# Patient Record
Sex: Female | Born: 1935 | Race: Black or African American | Hispanic: No | Smoking: Current every day smoker
Health system: Southern US, Community
[De-identification: ages and names within clinical notes are randomized; demographics above are authoritative.]

## PROBLEM LIST (undated history)

## (undated) DIAGNOSIS — M1990 Unspecified osteoarthritis, unspecified site: Secondary | ICD-10-CM

## (undated) DIAGNOSIS — I10 Essential (primary) hypertension: Secondary | ICD-10-CM

## (undated) DIAGNOSIS — N183 Chronic kidney disease, stage 3 unspecified: Secondary | ICD-10-CM

## (undated) DIAGNOSIS — E785 Hyperlipidemia, unspecified: Secondary | ICD-10-CM

## (undated) DIAGNOSIS — K219 Gastro-esophageal reflux disease without esophagitis: Secondary | ICD-10-CM

## (undated) DIAGNOSIS — C801 Malignant (primary) neoplasm, unspecified: Secondary | ICD-10-CM

## (undated) DIAGNOSIS — R0602 Shortness of breath: Secondary | ICD-10-CM

## (undated) DIAGNOSIS — J189 Pneumonia, unspecified organism: Secondary | ICD-10-CM

## (undated) HISTORY — PX: ABDOMINAL HYSTERECTOMY: SHX81

## (undated) HISTORY — PX: BREAST SURGERY: SHX581

## (undated) HISTORY — PX: WRIST SURGERY: SHX841

## (undated) HISTORY — PX: OTHER SURGICAL HISTORY: SHX169

## (undated) HISTORY — PX: CHOLECYSTECTOMY: SHX55

---

## 1997-06-13 ENCOUNTER — Encounter: Admission: RE | Admit: 1997-06-13 | Discharge: 1997-06-13 | Payer: Self-pay | Admitting: Hematology and Oncology

## 1997-12-06 ENCOUNTER — Encounter: Admission: RE | Admit: 1997-12-06 | Discharge: 1997-12-06 | Payer: Self-pay | Admitting: Internal Medicine

## 1998-04-18 ENCOUNTER — Encounter: Admission: RE | Admit: 1998-04-18 | Discharge: 1998-04-18 | Payer: Self-pay | Admitting: Internal Medicine

## 1998-08-01 ENCOUNTER — Ambulatory Visit (HOSPITAL_COMMUNITY): Admission: RE | Admit: 1998-08-01 | Discharge: 1998-08-01 | Payer: Self-pay | Admitting: Hematology and Oncology

## 1998-08-01 ENCOUNTER — Encounter: Admission: RE | Admit: 1998-08-01 | Discharge: 1998-08-01 | Payer: Self-pay | Admitting: Hematology and Oncology

## 1998-08-01 ENCOUNTER — Encounter: Payer: Self-pay | Admitting: Hematology and Oncology

## 1998-08-14 ENCOUNTER — Ambulatory Visit (HOSPITAL_COMMUNITY): Admission: RE | Admit: 1998-08-14 | Discharge: 1998-08-14 | Payer: Self-pay | Admitting: *Deleted

## 1998-09-29 ENCOUNTER — Encounter: Payer: Self-pay | Admitting: Emergency Medicine

## 1998-09-29 ENCOUNTER — Emergency Department (HOSPITAL_COMMUNITY): Admission: EM | Admit: 1998-09-29 | Discharge: 1998-09-29 | Payer: Self-pay | Admitting: Emergency Medicine

## 1998-11-15 ENCOUNTER — Emergency Department (HOSPITAL_COMMUNITY): Admission: EM | Admit: 1998-11-15 | Discharge: 1998-11-15 | Payer: Self-pay | Admitting: Emergency Medicine

## 1998-11-19 ENCOUNTER — Encounter: Admission: RE | Admit: 1998-11-19 | Discharge: 1998-11-19 | Payer: Self-pay | Admitting: Internal Medicine

## 1998-12-26 ENCOUNTER — Ambulatory Visit (HOSPITAL_BASED_OUTPATIENT_CLINIC_OR_DEPARTMENT_OTHER): Admission: RE | Admit: 1998-12-26 | Discharge: 1998-12-26 | Payer: Self-pay | Admitting: Orthopedic Surgery

## 1999-01-18 ENCOUNTER — Encounter: Admission: RE | Admit: 1999-01-18 | Discharge: 1999-02-22 | Payer: Self-pay | Admitting: Orthopedic Surgery

## 1999-05-07 ENCOUNTER — Encounter: Admission: RE | Admit: 1999-05-07 | Discharge: 1999-08-05 | Payer: Self-pay | Admitting: Orthopedic Surgery

## 1999-06-07 ENCOUNTER — Ambulatory Visit (HOSPITAL_COMMUNITY): Admission: RE | Admit: 1999-06-07 | Discharge: 1999-06-07 | Payer: Self-pay | Admitting: Internal Medicine

## 1999-06-07 ENCOUNTER — Encounter: Admission: RE | Admit: 1999-06-07 | Discharge: 1999-06-07 | Payer: Self-pay | Admitting: Internal Medicine

## 1999-06-07 ENCOUNTER — Encounter: Payer: Self-pay | Admitting: Internal Medicine

## 1999-07-22 ENCOUNTER — Encounter: Admission: RE | Admit: 1999-07-22 | Discharge: 1999-07-22 | Payer: Self-pay | Admitting: Internal Medicine

## 1999-08-06 ENCOUNTER — Encounter: Admission: RE | Admit: 1999-08-06 | Discharge: 1999-08-06 | Payer: Self-pay | Admitting: Internal Medicine

## 1999-08-06 ENCOUNTER — Ambulatory Visit (HOSPITAL_COMMUNITY): Admission: RE | Admit: 1999-08-06 | Discharge: 1999-08-06 | Payer: Self-pay | Admitting: Internal Medicine

## 1999-08-07 ENCOUNTER — Encounter: Payer: Self-pay | Admitting: Internal Medicine

## 1999-10-01 ENCOUNTER — Encounter: Admission: RE | Admit: 1999-10-01 | Discharge: 1999-10-01 | Payer: Self-pay | Admitting: Hematology and Oncology

## 1999-10-14 ENCOUNTER — Encounter: Admission: RE | Admit: 1999-10-14 | Discharge: 1999-10-14 | Payer: Self-pay | Admitting: Internal Medicine

## 1999-12-26 ENCOUNTER — Encounter: Admission: RE | Admit: 1999-12-26 | Discharge: 1999-12-26 | Payer: Self-pay

## 2000-06-19 ENCOUNTER — Ambulatory Visit (HOSPITAL_COMMUNITY): Admission: RE | Admit: 2000-06-19 | Discharge: 2000-06-19 | Payer: Self-pay

## 2000-06-19 ENCOUNTER — Encounter: Admission: RE | Admit: 2000-06-19 | Discharge: 2000-06-19 | Payer: Self-pay

## 2000-07-21 ENCOUNTER — Emergency Department (HOSPITAL_COMMUNITY): Admission: EM | Admit: 2000-07-21 | Discharge: 2000-07-21 | Payer: Self-pay | Admitting: Emergency Medicine

## 2000-10-21 ENCOUNTER — Encounter: Admission: RE | Admit: 2000-10-21 | Discharge: 2000-10-21 | Payer: Self-pay | Admitting: Internal Medicine

## 2000-12-28 ENCOUNTER — Encounter: Admission: RE | Admit: 2000-12-28 | Discharge: 2000-12-28 | Payer: Self-pay

## 2001-01-12 ENCOUNTER — Encounter: Admission: RE | Admit: 2001-01-12 | Discharge: 2001-01-12 | Payer: Self-pay | Admitting: Internal Medicine

## 2001-01-25 ENCOUNTER — Ambulatory Visit (HOSPITAL_COMMUNITY): Admission: RE | Admit: 2001-01-25 | Discharge: 2001-01-25 | Payer: Self-pay | Admitting: Internal Medicine

## 2001-02-03 ENCOUNTER — Encounter: Admission: RE | Admit: 2001-02-03 | Discharge: 2001-02-03 | Payer: Self-pay | Admitting: Internal Medicine

## 2001-03-02 ENCOUNTER — Encounter: Admission: RE | Admit: 2001-03-02 | Discharge: 2001-03-02 | Payer: Self-pay | Admitting: *Deleted

## 2001-03-05 ENCOUNTER — Encounter: Admission: RE | Admit: 2001-03-05 | Discharge: 2001-03-05 | Payer: Self-pay | Admitting: Internal Medicine

## 2001-06-02 ENCOUNTER — Encounter: Admission: RE | Admit: 2001-06-02 | Discharge: 2001-06-02 | Payer: Self-pay | Admitting: Internal Medicine

## 2001-06-09 ENCOUNTER — Encounter: Admission: RE | Admit: 2001-06-09 | Discharge: 2001-06-09 | Payer: Self-pay

## 2001-06-17 ENCOUNTER — Encounter: Admission: RE | Admit: 2001-06-17 | Discharge: 2001-06-17 | Payer: Self-pay | Admitting: Internal Medicine

## 2001-07-05 ENCOUNTER — Encounter: Admission: RE | Admit: 2001-07-05 | Discharge: 2001-07-05 | Payer: Self-pay | Admitting: Internal Medicine

## 2001-07-07 ENCOUNTER — Ambulatory Visit (HOSPITAL_BASED_OUTPATIENT_CLINIC_OR_DEPARTMENT_OTHER): Admission: RE | Admit: 2001-07-07 | Discharge: 2001-07-08 | Payer: Self-pay | Admitting: Orthopedic Surgery

## 2001-09-10 ENCOUNTER — Encounter: Admission: RE | Admit: 2001-09-10 | Discharge: 2001-12-09 | Payer: Self-pay | Admitting: Orthopedic Surgery

## 2002-01-06 ENCOUNTER — Encounter: Admission: RE | Admit: 2002-01-06 | Discharge: 2002-01-06 | Payer: Self-pay | Admitting: Orthopedic Surgery

## 2002-01-10 ENCOUNTER — Encounter: Admission: RE | Admit: 2002-01-10 | Discharge: 2002-01-10 | Payer: Self-pay | Admitting: Internal Medicine

## 2002-04-01 ENCOUNTER — Emergency Department (HOSPITAL_COMMUNITY): Admission: EM | Admit: 2002-04-01 | Discharge: 2002-04-01 | Payer: Self-pay | Admitting: Emergency Medicine

## 2002-04-01 ENCOUNTER — Encounter: Admission: RE | Admit: 2002-04-01 | Discharge: 2002-04-01 | Payer: Self-pay | Admitting: Internal Medicine

## 2002-04-04 ENCOUNTER — Encounter: Admission: RE | Admit: 2002-04-04 | Discharge: 2002-04-04 | Payer: Self-pay | Admitting: Internal Medicine

## 2002-04-07 ENCOUNTER — Encounter: Admission: RE | Admit: 2002-04-07 | Discharge: 2002-04-07 | Payer: Self-pay | Admitting: Internal Medicine

## 2002-04-12 ENCOUNTER — Encounter: Admission: RE | Admit: 2002-04-12 | Discharge: 2002-07-11 | Payer: Self-pay | Admitting: Internal Medicine

## 2002-04-21 ENCOUNTER — Encounter: Admission: RE | Admit: 2002-04-21 | Discharge: 2002-04-21 | Payer: Self-pay | Admitting: Internal Medicine

## 2002-07-06 ENCOUNTER — Encounter: Admission: RE | Admit: 2002-07-06 | Discharge: 2002-07-06 | Payer: Self-pay | Admitting: Internal Medicine

## 2002-07-14 ENCOUNTER — Encounter: Admission: RE | Admit: 2002-07-14 | Discharge: 2002-07-14 | Payer: Self-pay | Admitting: Internal Medicine

## 2002-08-20 ENCOUNTER — Emergency Department (HOSPITAL_COMMUNITY): Admission: EM | Admit: 2002-08-20 | Discharge: 2002-08-20 | Payer: Self-pay | Admitting: *Deleted

## 2002-08-20 ENCOUNTER — Encounter: Payer: Self-pay | Admitting: *Deleted

## 2002-09-01 ENCOUNTER — Encounter: Admission: RE | Admit: 2002-09-01 | Discharge: 2002-09-01 | Payer: Self-pay | Admitting: Internal Medicine

## 2002-09-02 ENCOUNTER — Encounter: Payer: Self-pay | Admitting: Internal Medicine

## 2002-09-02 ENCOUNTER — Encounter: Admission: RE | Admit: 2002-09-02 | Discharge: 2002-09-02 | Payer: Self-pay | Admitting: Internal Medicine

## 2002-09-02 ENCOUNTER — Ambulatory Visit (HOSPITAL_COMMUNITY): Admission: RE | Admit: 2002-09-02 | Discharge: 2002-09-02 | Payer: Self-pay | Admitting: Internal Medicine

## 2002-09-30 ENCOUNTER — Encounter: Admission: RE | Admit: 2002-09-30 | Discharge: 2002-09-30 | Payer: Self-pay | Admitting: Internal Medicine

## 2002-10-07 ENCOUNTER — Other Ambulatory Visit: Admission: RE | Admit: 2002-10-07 | Discharge: 2002-10-07 | Payer: Self-pay | Admitting: Internal Medicine

## 2002-10-07 ENCOUNTER — Ambulatory Visit (HOSPITAL_COMMUNITY): Admission: RE | Admit: 2002-10-07 | Discharge: 2002-10-07 | Payer: Self-pay | Admitting: Internal Medicine

## 2002-10-07 ENCOUNTER — Encounter: Admission: RE | Admit: 2002-10-07 | Discharge: 2002-10-07 | Payer: Self-pay | Admitting: Internal Medicine

## 2002-10-07 ENCOUNTER — Encounter: Payer: Self-pay | Admitting: Internal Medicine

## 2002-10-07 ENCOUNTER — Encounter (INDEPENDENT_AMBULATORY_CARE_PROVIDER_SITE_OTHER): Payer: Self-pay | Admitting: Specialist

## 2002-10-10 ENCOUNTER — Encounter: Admission: RE | Admit: 2002-10-10 | Discharge: 2002-10-10 | Payer: Self-pay | Admitting: Infectious Diseases

## 2002-10-18 ENCOUNTER — Encounter: Admission: RE | Admit: 2002-10-18 | Discharge: 2002-10-18 | Payer: Self-pay | Admitting: Internal Medicine

## 2002-10-20 ENCOUNTER — Encounter: Admission: RE | Admit: 2002-10-20 | Discharge: 2002-10-20 | Payer: Self-pay | Admitting: Internal Medicine

## 2002-12-21 ENCOUNTER — Encounter: Admission: RE | Admit: 2002-12-21 | Discharge: 2002-12-21 | Payer: Self-pay | Admitting: Internal Medicine

## 2003-01-04 ENCOUNTER — Emergency Department (HOSPITAL_COMMUNITY): Admission: EM | Admit: 2003-01-04 | Discharge: 2003-01-04 | Payer: Self-pay | Admitting: Emergency Medicine

## 2003-01-11 ENCOUNTER — Encounter: Admission: RE | Admit: 2003-01-11 | Discharge: 2003-01-11 | Payer: Self-pay | Admitting: Internal Medicine

## 2003-04-10 ENCOUNTER — Emergency Department (HOSPITAL_COMMUNITY): Admission: AD | Admit: 2003-04-10 | Discharge: 2003-04-10 | Payer: Self-pay | Admitting: Family Medicine

## 2003-05-21 ENCOUNTER — Emergency Department (HOSPITAL_COMMUNITY): Admission: AD | Admit: 2003-05-21 | Discharge: 2003-05-21 | Payer: Self-pay | Admitting: Family Medicine

## 2003-06-27 ENCOUNTER — Emergency Department (HOSPITAL_COMMUNITY): Admission: EM | Admit: 2003-06-27 | Discharge: 2003-06-27 | Payer: Self-pay | Admitting: Emergency Medicine

## 2003-07-18 ENCOUNTER — Ambulatory Visit (HOSPITAL_COMMUNITY): Admission: RE | Admit: 2003-07-18 | Discharge: 2003-07-18 | Payer: Self-pay | Admitting: Internal Medicine

## 2003-08-02 ENCOUNTER — Encounter: Admission: RE | Admit: 2003-08-02 | Discharge: 2003-08-29 | Payer: Self-pay | Admitting: Sports Medicine

## 2003-10-21 ENCOUNTER — Emergency Department (HOSPITAL_COMMUNITY): Admission: EM | Admit: 2003-10-21 | Discharge: 2003-10-21 | Payer: Self-pay | Admitting: Emergency Medicine

## 2004-04-22 ENCOUNTER — Encounter: Admission: RE | Admit: 2004-04-22 | Discharge: 2004-04-22 | Payer: Self-pay | Admitting: Sports Medicine

## 2004-07-08 ENCOUNTER — Emergency Department (HOSPITAL_COMMUNITY): Admission: EM | Admit: 2004-07-08 | Discharge: 2004-07-08 | Payer: Self-pay | Admitting: Emergency Medicine

## 2004-08-19 ENCOUNTER — Emergency Department (HOSPITAL_COMMUNITY): Admission: EM | Admit: 2004-08-19 | Discharge: 2004-08-19 | Payer: Self-pay | Admitting: Emergency Medicine

## 2005-05-24 ENCOUNTER — Emergency Department (HOSPITAL_COMMUNITY): Admission: EM | Admit: 2005-05-24 | Discharge: 2005-05-24 | Payer: Self-pay | Admitting: Emergency Medicine

## 2005-06-02 ENCOUNTER — Encounter: Admission: RE | Admit: 2005-06-02 | Discharge: 2005-06-13 | Payer: Self-pay | Admitting: Orthopedic Surgery

## 2006-04-02 ENCOUNTER — Emergency Department (HOSPITAL_COMMUNITY): Admission: EM | Admit: 2006-04-02 | Discharge: 2006-04-02 | Payer: Self-pay | Admitting: Emergency Medicine

## 2006-04-09 ENCOUNTER — Emergency Department (HOSPITAL_COMMUNITY): Admission: EM | Admit: 2006-04-09 | Discharge: 2006-04-09 | Payer: Self-pay | Admitting: Family Medicine

## 2006-09-11 ENCOUNTER — Emergency Department (HOSPITAL_COMMUNITY): Admission: EM | Admit: 2006-09-11 | Discharge: 2006-09-11 | Payer: Self-pay | Admitting: Emergency Medicine

## 2007-02-23 ENCOUNTER — Ambulatory Visit (HOSPITAL_COMMUNITY): Admission: RE | Admit: 2007-02-23 | Discharge: 2007-02-23 | Payer: Self-pay | Admitting: Specialist

## 2007-02-24 ENCOUNTER — Emergency Department (HOSPITAL_COMMUNITY): Admission: EM | Admit: 2007-02-24 | Discharge: 2007-02-24 | Payer: Self-pay | Admitting: Emergency Medicine

## 2007-02-27 ENCOUNTER — Encounter (INDEPENDENT_AMBULATORY_CARE_PROVIDER_SITE_OTHER): Payer: Self-pay | Admitting: Emergency Medicine

## 2007-02-27 ENCOUNTER — Emergency Department (HOSPITAL_COMMUNITY): Admission: EM | Admit: 2007-02-27 | Discharge: 2007-02-27 | Payer: Self-pay | Admitting: Emergency Medicine

## 2007-02-27 ENCOUNTER — Ambulatory Visit: Payer: Self-pay | Admitting: Vascular Surgery

## 2007-03-01 ENCOUNTER — Emergency Department (HOSPITAL_COMMUNITY): Admission: EM | Admit: 2007-03-01 | Discharge: 2007-03-01 | Payer: Self-pay | Admitting: *Deleted

## 2007-04-01 ENCOUNTER — Encounter: Admission: RE | Admit: 2007-04-01 | Discharge: 2007-04-01 | Payer: Self-pay | Admitting: General Surgery

## 2007-04-30 ENCOUNTER — Ambulatory Visit (HOSPITAL_COMMUNITY): Admission: RE | Admit: 2007-04-30 | Discharge: 2007-04-30 | Payer: Self-pay | Admitting: General Surgery

## 2007-05-05 ENCOUNTER — Encounter: Admission: RE | Admit: 2007-05-05 | Discharge: 2007-05-05 | Payer: Self-pay | Admitting: General Surgery

## 2007-06-07 ENCOUNTER — Emergency Department (HOSPITAL_COMMUNITY): Admission: EM | Admit: 2007-06-07 | Discharge: 2007-06-07 | Payer: Self-pay | Admitting: Emergency Medicine

## 2007-09-29 ENCOUNTER — Emergency Department (HOSPITAL_COMMUNITY): Admission: EM | Admit: 2007-09-29 | Discharge: 2007-09-29 | Payer: Self-pay | Admitting: Emergency Medicine

## 2007-10-31 ENCOUNTER — Emergency Department (HOSPITAL_COMMUNITY): Admission: EM | Admit: 2007-10-31 | Discharge: 2007-10-31 | Payer: Self-pay | Admitting: Emergency Medicine

## 2007-11-02 ENCOUNTER — Observation Stay (HOSPITAL_COMMUNITY): Admission: EM | Admit: 2007-11-02 | Discharge: 2007-11-04 | Payer: Self-pay | Admitting: Emergency Medicine

## 2008-05-06 ENCOUNTER — Emergency Department (HOSPITAL_COMMUNITY): Admission: EM | Admit: 2008-05-06 | Discharge: 2008-05-06 | Payer: Self-pay | Admitting: Emergency Medicine

## 2008-10-28 ENCOUNTER — Emergency Department (HOSPITAL_COMMUNITY): Admission: EM | Admit: 2008-10-28 | Discharge: 2008-10-28 | Payer: Self-pay | Admitting: Emergency Medicine

## 2009-02-01 ENCOUNTER — Emergency Department (HOSPITAL_COMMUNITY): Admission: EM | Admit: 2009-02-01 | Discharge: 2009-02-01 | Payer: Self-pay | Admitting: Family Medicine

## 2009-02-17 ENCOUNTER — Inpatient Hospital Stay (HOSPITAL_COMMUNITY): Admission: EM | Admit: 2009-02-17 | Discharge: 2009-02-21 | Payer: Self-pay | Admitting: Emergency Medicine

## 2009-11-28 ENCOUNTER — Emergency Department (HOSPITAL_COMMUNITY): Admission: EM | Admit: 2009-11-28 | Discharge: 2009-11-28 | Payer: Self-pay | Admitting: Emergency Medicine

## 2009-11-29 ENCOUNTER — Emergency Department (HOSPITAL_COMMUNITY): Admission: EM | Admit: 2009-11-29 | Discharge: 2009-11-29 | Payer: Self-pay | Admitting: Emergency Medicine

## 2010-03-24 ENCOUNTER — Encounter: Payer: Self-pay | Admitting: Sports Medicine

## 2010-03-24 ENCOUNTER — Encounter: Payer: Self-pay | Admitting: Internal Medicine

## 2010-03-25 ENCOUNTER — Encounter: Payer: Self-pay | Admitting: Internal Medicine

## 2010-05-16 LAB — GLUCOSE, CAPILLARY: Glucose-Capillary: 100 mg/dL — ABNORMAL HIGH (ref 70–99)

## 2010-06-03 LAB — CBC
HCT: 30.3 % — ABNORMAL LOW (ref 36.0–46.0)
HCT: 32.1 % — ABNORMAL LOW (ref 36.0–46.0)
Hemoglobin: 10.3 g/dL — ABNORMAL LOW (ref 12.0–15.0)
Hemoglobin: 10.7 g/dL — ABNORMAL LOW (ref 12.0–15.0)
Hemoglobin: 10.9 g/dL — ABNORMAL LOW (ref 12.0–15.0)
MCHC: 33.2 g/dL (ref 30.0–36.0)
MCHC: 33.3 g/dL (ref 30.0–36.0)
MCHC: 33.7 g/dL (ref 30.0–36.0)
MCV: 91.3 fL (ref 78.0–100.0)
MCV: 91.7 fL (ref 78.0–100.0)
Platelets: 254 10*3/uL (ref 150–400)
RBC: 3.37 MIL/uL — ABNORMAL LOW (ref 3.87–5.11)
RBC: 3.51 MIL/uL — ABNORMAL LOW (ref 3.87–5.11)
RDW: 13.9 % (ref 11.5–15.5)
RDW: 14.1 % (ref 11.5–15.5)
RDW: 14.3 % (ref 11.5–15.5)
WBC: 5.5 10*3/uL (ref 4.0–10.5)

## 2010-06-03 LAB — BASIC METABOLIC PANEL
BUN: 10 mg/dL (ref 6–23)
BUN: 12 mg/dL (ref 6–23)
CO2: 26 mEq/L (ref 19–32)
CO2: 28 mEq/L (ref 19–32)
CO2: 28 mEq/L (ref 19–32)
CO2: 28 mEq/L (ref 19–32)
Calcium: 8 mg/dL — ABNORMAL LOW (ref 8.4–10.5)
Calcium: 8.1 mg/dL — ABNORMAL LOW (ref 8.4–10.5)
Calcium: 8.2 mg/dL — ABNORMAL LOW (ref 8.4–10.5)
Calcium: 8.9 mg/dL (ref 8.4–10.5)
Chloride: 106 mEq/L (ref 96–112)
Chloride: 106 mEq/L (ref 96–112)
Creatinine, Ser: 1.01 mg/dL (ref 0.4–1.2)
Creatinine, Ser: 1.16 mg/dL (ref 0.4–1.2)
Creatinine, Ser: 1.16 mg/dL (ref 0.4–1.2)
GFR calc Af Amer: 56 mL/min — ABNORMAL LOW (ref >60)
GFR calc Af Amer: 56 mL/min — ABNORMAL LOW (ref >60)
GFR calc Af Amer: 60 mL/min — ABNORMAL LOW (ref >60)
GFR calc Af Amer: >60 mL/min (ref >60)
GFR calc non Af Amer: 46 mL/min — ABNORMAL LOW (ref >60)
GFR calc non Af Amer: 47 mL/min — ABNORMAL LOW (ref >60)
GFR calc non Af Amer: 49 mL/min — ABNORMAL LOW (ref >60)
Glucose, Bld: 102 mg/dL — ABNORMAL HIGH (ref 70–99)
Glucose, Bld: 129 mg/dL — ABNORMAL HIGH (ref 70–99)
Potassium: 3.8 mEq/L (ref 3.5–5.1)
Potassium: 4 mEq/L (ref 3.5–5.1)
Sodium: 137 mEq/L (ref 135–145)
Sodium: 141 mEq/L (ref 135–145)
Sodium: 141 mEq/L (ref 135–145)

## 2010-06-03 LAB — GLUCOSE, CAPILLARY
Glucose-Capillary: 103 mg/dL — ABNORMAL HIGH (ref 70–99)
Glucose-Capillary: 111 mg/dL — ABNORMAL HIGH (ref 70–99)
Glucose-Capillary: 125 mg/dL — ABNORMAL HIGH (ref 70–99)
Glucose-Capillary: 144 mg/dL — ABNORMAL HIGH (ref 70–99)
Glucose-Capillary: 166 mg/dL — ABNORMAL HIGH (ref 70–99)
Glucose-Capillary: 98 mg/dL (ref 70–99)
Glucose-Capillary: 98 mg/dL (ref 70–99)

## 2010-06-03 LAB — HEMOGLOBIN A1C: Mean Plasma Glucose: 154 mg/dL

## 2010-06-03 LAB — URINALYSIS, ROUTINE W REFLEX MICROSCOPIC
Bilirubin Urine: NEGATIVE
Ketones, ur: NEGATIVE mg/dL
Nitrite: NEGATIVE
Urobilinogen, UA: 0.2 mg/dL (ref 0.0–1.0)
pH: 7 (ref 5.0–8.0)

## 2010-06-03 LAB — DIFFERENTIAL
Basophils Absolute: 0 10*3/uL (ref 0.0–0.1)
Basophils Relative: 0 % (ref 0–1)
Eosinophils Relative: 0 % (ref 0–5)
Monocytes Absolute: 0.7 10*3/uL (ref 0.1–1.0)

## 2010-06-03 LAB — CULTURE, BLOOD (ROUTINE X 2)
Culture: NO GROWTH
Culture: NO GROWTH

## 2010-06-03 LAB — LIPID PANEL
Cholesterol: 107 mg/dL (ref 0–200)
HDL: 49 mg/dL (ref >39)

## 2010-06-04 LAB — DIFFERENTIAL
Basophils Relative: 0 % (ref 0–1)
Eosinophils Absolute: 0.2 10*3/uL (ref 0.0–0.7)
Lymphs Abs: 1.3 10*3/uL (ref 0.7–4.0)
Monocytes Absolute: 0.5 10*3/uL (ref 0.1–1.0)
Monocytes Relative: 5 % (ref 3–12)
Neutrophils Relative %: 78 % — ABNORMAL HIGH (ref 43–77)

## 2010-06-04 LAB — POCT I-STAT, CHEM 8
Calcium, Ion: 1.11 mmol/L — ABNORMAL LOW (ref 1.12–1.32)
Chloride: 106 mEq/L (ref 96–112)
Creatinine, Ser: 0.9 mg/dL (ref 0.4–1.2)
Glucose, Bld: 111 mg/dL — ABNORMAL HIGH (ref 70–99)
Hemoglobin: 12.2 g/dL (ref 12.0–15.0)
Potassium: 3.5 mEq/L (ref 3.5–5.1)

## 2010-06-04 LAB — CBC
HCT: 33.9 % — ABNORMAL LOW (ref 36.0–46.0)
Hemoglobin: 11.7 g/dL — ABNORMAL LOW (ref 12.0–15.0)
MCHC: 34.5 g/dL (ref 30.0–36.0)
MCV: 91.1 fL (ref 78.0–100.0)
RBC: 3.72 MIL/uL — ABNORMAL LOW (ref 3.87–5.11)
WBC: 9.1 10*3/uL (ref 4.0–10.5)

## 2010-06-08 LAB — URINALYSIS, ROUTINE W REFLEX MICROSCOPIC
Hgb urine dipstick: NEGATIVE
Nitrite: NEGATIVE
Protein, ur: NEGATIVE mg/dL
Urobilinogen, UA: 0.2 mg/dL (ref 0.0–1.0)

## 2010-06-08 LAB — GLUCOSE, CAPILLARY

## 2010-06-08 LAB — COMPREHENSIVE METABOLIC PANEL
ALT: 15 U/L (ref 0–35)
Alkaline Phosphatase: 86 U/L (ref 39–117)
BUN: 18 mg/dL (ref 6–23)
CO2: 22 mEq/L (ref 19–32)
GFR calc non Af Amer: 42 mL/min — ABNORMAL LOW (ref >60)
Glucose, Bld: 121 mg/dL — ABNORMAL HIGH (ref 70–99)
Potassium: 3.6 mEq/L (ref 3.5–5.1)
Sodium: 138 mEq/L (ref 135–145)
Total Bilirubin: 0.7 mg/dL (ref 0.3–1.2)

## 2010-06-08 LAB — CBC
HCT: 38.3 % (ref 36.0–46.0)
Hemoglobin: 12.8 g/dL (ref 12.0–15.0)
RBC: 4.16 MIL/uL (ref 3.87–5.11)

## 2010-06-08 LAB — DIFFERENTIAL
Basophils Absolute: 0 10*3/uL (ref 0.0–0.1)
Basophils Relative: 0 % (ref 0–1)
Eosinophils Absolute: 0.1 10*3/uL (ref 0.0–0.7)
Neutrophils Relative %: 74 % (ref 43–77)

## 2010-06-13 LAB — POCT URINALYSIS DIP (DEVICE)
Glucose, UA: NEGATIVE mg/dL
Hgb urine dipstick: NEGATIVE
Nitrite: NEGATIVE
Urobilinogen, UA: 0.2 mg/dL (ref 0.0–1.0)

## 2010-06-27 ENCOUNTER — Emergency Department (HOSPITAL_COMMUNITY): Payer: Medicare Other

## 2010-06-27 ENCOUNTER — Emergency Department (HOSPITAL_COMMUNITY)
Admission: EM | Admit: 2010-06-27 | Discharge: 2010-06-27 | Disposition: A | Discharge status: Home / Self Care | Payer: Medicare Other | Attending: Emergency Medicine | Admitting: Emergency Medicine

## 2010-06-27 DIAGNOSIS — R05 Cough: Secondary | ICD-10-CM | POA: Insufficient documentation

## 2010-06-27 DIAGNOSIS — J3489 Other specified disorders of nose and nasal sinuses: Secondary | ICD-10-CM | POA: Insufficient documentation

## 2010-06-27 DIAGNOSIS — E119 Type 2 diabetes mellitus without complications: Secondary | ICD-10-CM | POA: Insufficient documentation

## 2010-06-27 DIAGNOSIS — I10 Essential (primary) hypertension: Secondary | ICD-10-CM | POA: Insufficient documentation

## 2010-06-27 DIAGNOSIS — R059 Cough, unspecified: Secondary | ICD-10-CM | POA: Insufficient documentation

## 2010-06-27 DIAGNOSIS — J449 Chronic obstructive pulmonary disease, unspecified: Secondary | ICD-10-CM | POA: Insufficient documentation

## 2010-06-27 DIAGNOSIS — R062 Wheezing: Secondary | ICD-10-CM | POA: Insufficient documentation

## 2010-06-27 DIAGNOSIS — J4489 Other specified chronic obstructive pulmonary disease: Secondary | ICD-10-CM | POA: Insufficient documentation

## 2010-07-16 NOTE — H&P (Signed)
Stacey Zamora, Stacey Zamora                  ACCOUNT NO.:  [id number]   MEDICAL RECORD NO.:  [id number]          PATIENT TYPE:  OBV   LOCATION:  5127                         FACILITY:  [facility]   PHYSICIAN:  Fleet Contras, M.D.    DATE OF BIRTH:  14-Feb-1936   DATE OF ADMISSION:  11/02/2007  DATE OF DISCHARGE:                              HISTORY & PHYSICAL   PRESENTING COMPLAINTS:  Abdominal pain, nausea, vomiting, and diarrhea.   HISTORY OF PRESENTING ILLNESS:  Stacey Zamora is a 75 year old African  American lady with medical history significant for systemic  hypertension, type 2 diabetes mellitus, gastroesophageal reflux disease,  right breast cancer, and arthritis of multiple joints including shoulder  and hips.  She was referred from the office to the emergency room after  she presented with severe abdominal pain in the lower abdomen associated  with nausea and vomiting, which she states that has been going on for  about 4 days.  She had been to the ED 3 days earlier and had blood work,  which were all negative.  She has received intravenous fluids,  intravenous antiemetic agents, and was discharged home, but since she  went home, her symptoms have not abated.  She stated that she cannot  keep any food or drinks down and that she is in severe pain persistently  and therefore came to the office for followup.  In the office, she was  in severe painful distress, retching but no vomiting.  She also stated  that she had been having watery diarrhea, which had improved, but  recurred again in the last few days.  She denied having any chest pain,  shortness of breath, orthopnea, or PND.  She denies any recent cold or  cough.  No fevers or chills.  As a result of severity of her symptoms,  she was referred back to the emergency room for further evaluation and  CT scan.  At the ED, she had repeat of her blood work, which all back  negative and a CT scan of the abdomen and pelvis also revealed no acute  abnormalities, but she was admitted to the hospital for pain management  and management of vomiting.   PAST MEDICAL HISTORY:  1. Systemic hypertension.  2. Type 2 diabetes mellitus.  3. History of right breast cancer.  4. Degenerative joint disease of the shoulders and hips and knees.  5. Gastroesophageal reflux disease.  6. Allergic rhinitis.  7. Diabetic neuropathy.   MEDICATION HISTORY:  She is on:  1. Warfarin 500 mg b.i.d.  2. Colace 100 mg daily.  3. Astelin nasal spray 2 sprays each nostril b.i.d.  4. Flonase 2 sprays each nostril daily.  5. Actos 50 mg daily.  6. Amitriptyline 50 mg at bedtime.  7. Flexeril 5 mg 3 times a day as needed.  8. Albuterol HFA 2 puffs q.6 h. p.r.n.  9. Kapidex 60 mg one a day.   ALLERGIES:  MORPHINE and VICODIN, which cause pruritus and hives.   FAMILY AND SOCIAL HISTORY:  She lives with her family.  Her parents are  deceased.  She has seven siblings, one of them with diabetes.  She has  three children in apparently good health.  She denies any use of  alcohol, tobacco, or elicit.   Her review of systems essentially as above.   PHYSICAL EXAMINATION:  In the office, she was in severe painful  distress.  She is not pale.  She is not icteric.  She is not cyanosed.  Her initial vital signs shows a blood pressure of 136/84, heart rate of  96 and regular, respiratory rate of 16, and temperature 97.6.  She  weighs in at 200 pounds at a height of 5 feet 1 inch, and BMI of 37.6.  Her oral mucosa is dry.  Her neck is supple with no elevated JVD and no  carotid bruit.  Chest shows good air entry bilaterally with no rales,  rhonchi, or wheezes.  Heart sounds 1 and 2 are heard with no murmurs, no  rub, and no clicks.  Abdomen is obese.  There is tenderness in the  epigastrium, lower abdomen.  There is no rebound, no tenderness, and no  guarding.  Bowel sounds are present.  Extremities showed no edema, no  calf tenderness, or swelling.  CNS, she is  alert and oriented x3 with no  focal neurological deficits.   LABORATORY DATA:  White count is 7.2, hemoglobin 12.2, hematocrit 36.4,  and platelet count of 286.  Sodium is 140, potassium 3.7, chloride 109,  bicarbonate of 22, BUN is 14, creatinine 1.19, and glucose is 106.  Lipase is 27, AST is 28, alkaline phosphatase 71, ALT 16, total  bilirubin is 0.6.  CT scan of the abdomen and pelvis shows no acute  abnormality.  There is a nodular opacity in the right lower lung, which  needs further close monitoring.   ASSESSMENT:  Stacey Zamora is a 75 year old African American lady with  multiple medical problems as stated above presenting with severe  abdominal pain of unknown etiology.  She is being admitted to the  hospital for pain management and close monitoring.   ADMISSION DIAGNOSES:  Abdominal pain and mild dehydration.  She will be  on clear liquids and vital signs q.4 h.  Her CBGs will be monitored  before meals to  restrict I's and O's.  She will be on IV Zofran  q.4h  p.r.n. for vomiting.  Sliding scale NovoLog insulin per protocol with  home medications noted as above except for metformin as a result of  recent contrast study.  This plan of care has been discussed with her  and her daughter and they agree with plan.      Fleet Contras, M.D.  Electronically Signed     EA/MEDQ  D:  11/02/2007  T:  11/03/2007  Job:  [id number]

## 2010-07-16 NOTE — Op Note (Signed)
Stacey Zamora, Stacey Zamora                  ACCOUNT NO.:  [id number]   MEDICAL RECORD NO.:  [id number]          PATIENT TYPE:  AMB   LOCATION:  DAY                          FACILITY:  [facility]   PHYSICIAN:  Lennie Muckle, MD      DATE OF BIRTH:  18-May-1935   DATE OF PROCEDURE:  04/30/2007  DATE OF DISCHARGE:                               OPERATIVE REPORT   PREOPERATIVE DIAGNOSIS:  Symptomatic cholelithiasis.   POSTOPERATIVE DIAGNOSIS:  Right upper quadrant pain.   PROCEDURE:  Diagnostic laparoscopy.   SURGEON:  Lennie Muckle, MD   ASSISTANT:  Angelia Mould. Derrell Lolling, MD   ANESTHESIA:  General endotracheal anesthesia.   FINDINGS:  No visible gallbladder, patent common bile duct.   SPECIMENS:  None.   COMPLICATIONS:  No immediate complications.   ESTIMATED BLOOD LOSS:  Minimal amount of blood loss.   INDICATIONS FOR PROCEDURE:  Ms. Stacey Zamora is a 75 year old female who  was previously being seen by Dr. Sheppard Plumber. Earlene Plater due to what was  presumed to be symptomatic cholelithiasis.  She had a full workup which  included chest CT as well as an abdominal ultrasound.  The ultrasound  revealed a contracted gallbladder with a thickened wall.  She was  supposed to have her surgery done by Dr. Earlene Plater, however, due to his  recent illness I took over her care.  I reviewed her records and agreed  with the diagnosis.  She was obtained informed consent for laparoscopic  cholecystectomy with cholangiogram.   DESCRIPTION OF PROCEDURE:  Ms. Stacey Zamora was identified in the preoperative  holding suite.  She was given IV antibiotic and then taken to the  operating room.  Once in the operating room she was placed in a supine  position and received general endotracheal anesthesia.  A time-out  procedure indicating the patient and procedure were performed after her  abdomen was prepped and draped in the usual sterile fashion.  A  supraumbilical incision was placed.  The umbilicus was retracted up away  from the  abdominal cavity and then a Veress needle placed into the  abdominal cavity.  After obtaining an adequate pneumoperitoneum a camera  was placed using Optiview into the abdominal cavity.  There was no  evidence of injury upon placement of the camera or the Veress needle.  Three additional 5 mm trocars were placed under visualization with the  camera.  One was placed at the gastric region and two were placed along  the right costochondral margin.  The liver was lifted away from the  omentum in the area of the duodenum.  There appeared to be a very small  retracted bluish colored object which was believed to be the  gallbladder.  The peritoneum surrounding this was carefully dissected  with the [place] forceps.  I carefully dissected on the medial and  lateral aspects of this structure.  However, it was noted upon further  inspection by Dr. Derrell Lolling and myself we saw the hepatic artery coursing  in close proximity to this structure and it appeared to dive into the  duodenum  as well as up into the liver.  Thus, it was believed to be the  common bile duct and not the gallbladder. After further careful  inspection we retrieved the patient's records from the computer,  reviewed the ultrasound as well as had a discussion with the radiologist  the results of the ultrasound.  The presumed gallbladder could have been  a thickened duodenum due to its close vicinity in the area.  We could  not rule out a congenital absence of the gallbladder.  All of the  anatomy upon further inspection did clearly seem to be the common bile  duct and there did not appear to be a gallbladder present.  At that time  I aborted the procedure.  The supraumbilical fascia was closed with zero  Vicryl suture.  The skin was closed at the port sites with 4-0 Monocryl  after release of pneumoperitoneum.  Steri-Strips were placed with final  dressing.  The patient was then extubated and transported to the post  anesthesia care  unit in stable condition.  I will have her perform a  HIDA scan as an outpatient to reinforce the findings at the time of  surgery.      Lennie Muckle, MD  Electronically Signed     ALA/MEDQ  D:  04/30/2007  T:  05/01/2007  Job:  [id number]   cc:   Fleet Contras, M.D.  Fax: [phone]

## 2010-07-19 NOTE — Op Note (Signed)
[place]  Patient:    MEHR, DEPAOLI Visit Number: [id number] MRN: [id number]          Service Type: DSU Location: [facility] Attending Physician:  Marlowe Shores Dictated by:   Artist Pais Mina Marble, M.D. Proc. Date: 08/07/01 Admit Date:  07/07/2001 Discharge Date: 07/08/2001                             Operative Report  PREOPERATIVE DIAGNOSIS:  Right thumb carpal metacarpal joint arthritis.  POSTOPERATIVE DIAGNOSIS:  Right thumb carpal metacarpal joint arthritis.  PROCEDURES:  Right thumb carpal metacarpal joint suspension plasty.  SURGEON:  Artist Pais. Mina Marble, M.D.  ASSISTANT:  Junius Roads. Ireton, P.A.C.  ANESTHESIA:  Axillary block.  TOURNIQUET TIME:  One hour.  COMPLICATIONS:  None.  DRAINS:  None.  DESCRIPTION OF PROCEDURE:  The patient was taken to the operating room.  After the induction of adequate axillary block analgesia, the right upper extremity was prepped and draped in the usual sterile fashion.  An Esmarch was used to exsanguinate the limb and then the tourniquet was inflated to 250 mmHg.  At this point in time, a J-shaped incision was made over right thumb and a large volar based flap was elevated.  The thenar muscles were elevated off of the CMP joint.  The CM joint was identified and incised and CMC synovectomy and trapeziectomy were performed.  Once this was done, a gouge and sequential drill bits were used to make transosseous canals in both the thumb and metacarpal base and the index metacarpal base under direct vision.  After this was done, a second incision was made over the dorsal radial aspect of the right wrist where the APL tendon was identified, retracted at the musculotendinous junction, incised, and drawn into the proximal wound.  It was then drawn from dorsal to volar through the thumb metacarpal and then volar to dorsal through the index metacarpal and tied to a third incision to the extensor carpi  radialis brevis tendon using Pulvertaft weaved.  After this was done, the wound was thoroughly irrigated.  The capsule overlying the [facility] joint was closed with 4-0 Mersilene and then the skin incisions x 3 were closed with 3-0 Prolene subcuticularly.  Steri-Strips, 4 x 4s, fluffs, and a compressive dressing, as well as a splint were applied.  The patient tolerated the procedure well and went to the recovery room in stable fashion. Dictated by:   Artist Pais Mina Marble, M.D. Attending Physician:  Marlowe Shores DD:  08/18/01 TD:  08/19/01 Job: [id number]

## 2010-07-19 NOTE — Discharge Summary (Signed)
NAMEALANIS, Stacey Zamora                  ACCOUNT NO.:  [id number]   MEDICAL RECORD NO.:  [id number]          PATIENT TYPE:  OBV   LOCATION:  5124                         FACILITY:  [facility]   PHYSICIAN:  Fleet Contras, M.D.    DATE OF BIRTH:  February 11, 1936   DATE OF ADMISSION:  11/02/2007  DATE OF DISCHARGE:  11/04/2007                               DISCHARGE SUMMARY   Please see the dictated history and physical for full details of  presentation.   SUMMARY:  Stacey Zamora is a 75 year old African American lady with multiple  medical problems including systemic hypertension, type 2 diabetes  mellitus, gastroesophageal reflux disease, right breast cancer, and  arthritis of multiple joint including the shoulder and hips.  She was  admitted from the office with severe abdominal pain in the lower abdomen  associated with nausea and vomiting, which had been going on for about 4  days.  She had been seen in the ED 3 days earlier and blood work had  been negative.  A CT scan of the abdomen and pelvis also revealed no  abnormalities.  She was admitted to the hospital for intractable pain  and vomiting.   HOSPITAL COURSE:  On admission, the patient was started on intravenous  fluids.  IV Zofran was given for nausea and vomiting.  She was initially  on clear liquids and this was rapidly advanced.  Her CBGs were monitored  a.c. and h.s. and covered with sliding scale NovoLog insulin.  Her  symptoms improved by the next day November 04, 2007.  She was feeling  much better and able to tolerate full diet.  Her vital signs were  stable.  CBG was 138.  Abdomen was benign.  C. difficile toxin was  negative and she was therefore considered stable for discharge home.   DISCHARGE DIAGNOSES:  1. Abdominal pain due to gastroenteritis.  2. Gastroenteritis.  3. Dehydration.  4. Type 2 diabetes.  5. Systemic hypertension.   Condition on discharge was stable.   Her disposition was for home.   Her discharge  medications were:  1. Metformin 500 mg b.i.d.  2. Actos q.a.m.  3. Cozaar 50 mg daily.  4. Gabapentin 300 mg 3 pills 3 times a day.  5. Amitriptyline 50 mg q.h.s.  6. Diazepam 10 mg daily p.r.n.  7. Tramadol 50 mg 1 q. 6 p.r.n.  8. Hydroxyzine 25 mg once a day p.r.n.  9. Flexeril 10 mg t.i.d. a.c.  10.Darvocet-N 100 one p.o. q. 6 p.r.n.   The plan of care was discussed with her and her family and their  questions were answered.      Fleet Contras, M.D.  Electronically Signed     EA/MEDQ  D:  12/15/2007  T:  12/15/2007  Job:  [id number]

## 2010-09-25 ENCOUNTER — Other Ambulatory Visit: Payer: Self-pay | Admitting: Internal Medicine

## 2010-09-25 DIAGNOSIS — K219 Gastro-esophageal reflux disease without esophagitis: Secondary | ICD-10-CM

## 2010-09-27 ENCOUNTER — Other Ambulatory Visit: Payer: Medicare Other

## 2010-11-22 LAB — COMPREHENSIVE METABOLIC PANEL
AST: 33
CO2: 26
Calcium: 9.3
Creatinine, Ser: 1.26 — ABNORMAL HIGH
GFR calc Af Amer: 51 — ABNORMAL LOW
GFR calc non Af Amer: 42 — ABNORMAL LOW

## 2010-11-22 LAB — URINALYSIS, ROUTINE W REFLEX MICROSCOPIC
Protein, ur: NEGATIVE
Urobilinogen, UA: 0.2

## 2010-11-22 LAB — HEMOGLOBIN AND HEMATOCRIT, BLOOD: Hemoglobin: 11.8 — ABNORMAL LOW

## 2010-11-26 LAB — POCT RAPID STREP A: Streptococcus, Group A Screen (Direct): NEGATIVE

## 2010-12-04 LAB — COMPREHENSIVE METABOLIC PANEL
Alkaline Phosphatase: 71
BUN: 14
Calcium: 9.5
Creatinine, Ser: 1.19
Glucose, Bld: 106 — ABNORMAL HIGH
Total Protein: 7.4

## 2010-12-04 LAB — DIFFERENTIAL
Basophils Absolute: 0
Basophils Relative: 1
Eosinophils Absolute: 0.1
Eosinophils Relative: 1
Lymphocytes Relative: 17
Lymphs Abs: 1.2
Monocytes Absolute: 0.3
Monocytes Relative: 5
Neutro Abs: 5.5
Neutrophils Relative %: 77

## 2010-12-04 LAB — COMPREHENSIVE METABOLIC PANEL WITH GFR
ALT: 16
AST: 28
Albumin: 4
CO2: 22
Chloride: 109
GFR calc Af Amer: 54 — ABNORMAL LOW
GFR calc non Af Amer: 45 — ABNORMAL LOW
Potassium: 3.7
Sodium: 140
Total Bilirubin: 0.6

## 2010-12-04 LAB — CBC
HCT: 34.3 — ABNORMAL LOW
HCT: 36.4
Hemoglobin: 12.2
MCHC: 33.5
MCV: 90.8
MCV: 92
Platelets: 266
Platelets: 286
RBC: 3.96
RDW: 13.7
RDW: 14.2
WBC: 7.2

## 2010-12-04 LAB — GLUCOSE, CAPILLARY
Glucose-Capillary: 103 — ABNORMAL HIGH
Glucose-Capillary: 119 — ABNORMAL HIGH
Glucose-Capillary: 138 — ABNORMAL HIGH
Glucose-Capillary: 69 — ABNORMAL LOW
Glucose-Capillary: 76
Glucose-Capillary: 84
Glucose-Capillary: 95
Glucose-Capillary: 95
Glucose-Capillary: 96

## 2010-12-04 LAB — URINALYSIS, ROUTINE W REFLEX MICROSCOPIC
Bilirubin Urine: NEGATIVE
Glucose, UA: NEGATIVE
Hgb urine dipstick: NEGATIVE
Ketones, ur: 15 — AB
Nitrite: NEGATIVE
Protein, ur: NEGATIVE
Specific Gravity, Urine: 1.017
Urobilinogen, UA: 0.2
pH: 5

## 2010-12-04 LAB — BASIC METABOLIC PANEL
BUN: 12
Chloride: 107
GFR calc non Af Amer: 37 — ABNORMAL LOW
Glucose, Bld: 81
Potassium: 3.5

## 2010-12-04 LAB — URINE MICROSCOPIC-ADD ON

## 2010-12-06 LAB — DIFFERENTIAL
Basophils Absolute: 0.1
Basophils Relative: 1
Basophils Relative: 1
Eosinophils Absolute: 0.1
Lymphocytes Relative: 26
Lymphs Abs: 2.2
Monocytes Absolute: 0.4
Monocytes Relative: 5
Neutro Abs: 4.6
Neutro Abs: 4.7
Neutrophils Relative %: 67

## 2010-12-06 LAB — COMPREHENSIVE METABOLIC PANEL
ALT: 16
Albumin: 3.6
Alkaline Phosphatase: 93
Potassium: 4
Sodium: 143
Total Protein: 6.4

## 2010-12-06 LAB — I-STAT 8, (EC8 V) (CONVERTED LAB)
BUN: 22
BUN: 29 — ABNORMAL HIGH
Bicarbonate: 27.6 — ABNORMAL HIGH
Glucose, Bld: 105 — ABNORMAL HIGH
Glucose, Bld: 105 — ABNORMAL HIGH
HCT: 32 — ABNORMAL LOW
HCT: 36
Hemoglobin: 10.9 — ABNORMAL LOW
Operator id: 234501
Potassium: 4
Sodium: 144
TCO2: 27
pCO2, Ven: 46.6
pH, Ven: 7.38 — ABNORMAL HIGH

## 2010-12-06 LAB — URINALYSIS, ROUTINE W REFLEX MICROSCOPIC
Glucose, UA: NEGATIVE
Ketones, ur: NEGATIVE
Nitrite: NEGATIVE
Nitrite: NEGATIVE
Protein, ur: NEGATIVE
Protein, ur: NEGATIVE
Urobilinogen, UA: 0.2
Urobilinogen, UA: 0.2
pH: 6

## 2010-12-06 LAB — HEPATIC FUNCTION PANEL
ALT: 13
Alkaline Phosphatase: 79
Bilirubin, Direct: <0.1

## 2010-12-06 LAB — CBC
MCHC: 33.2
Platelets: 287
Platelets: 308
RDW: 14.9
RDW: 15

## 2010-12-06 LAB — POCT CARDIAC MARKERS
CKMB, poc: 1.1
Myoglobin, poc: 104
Troponin i, poc: <0.05

## 2010-12-06 LAB — POCT I-STAT CREATININE: Creatinine, Ser: 1.4 — ABNORMAL HIGH

## 2011-01-28 ENCOUNTER — Emergency Department (HOSPITAL_COMMUNITY): Payer: Medicare Other

## 2011-01-28 ENCOUNTER — Encounter: Payer: Self-pay | Admitting: Emergency Medicine

## 2011-01-28 ENCOUNTER — Other Ambulatory Visit: Payer: Self-pay

## 2011-01-28 ENCOUNTER — Inpatient Hospital Stay (HOSPITAL_COMMUNITY)
Admission: EM | Admit: 2011-01-28 | Discharge: 2011-01-31 | DRG: 641 | Disposition: A | Discharge status: Home / Self Care | Payer: Medicare Other | Attending: Nephrology | Admitting: Nephrology

## 2011-01-28 DIAGNOSIS — Z79899 Other long term (current) drug therapy: Secondary | ICD-10-CM

## 2011-01-28 DIAGNOSIS — R531 Weakness: Secondary | ICD-10-CM

## 2011-01-28 DIAGNOSIS — R1110 Vomiting, unspecified: Secondary | ICD-10-CM

## 2011-01-28 DIAGNOSIS — K529 Noninfective gastroenteritis and colitis, unspecified: Secondary | ICD-10-CM | POA: Diagnosis present

## 2011-01-28 DIAGNOSIS — F17200 Nicotine dependence, unspecified, uncomplicated: Secondary | ICD-10-CM | POA: Diagnosis present

## 2011-01-28 DIAGNOSIS — K5289 Other specified noninfective gastroenteritis and colitis: Secondary | ICD-10-CM | POA: Diagnosis present

## 2011-01-28 DIAGNOSIS — R05 Cough: Secondary | ICD-10-CM

## 2011-01-28 DIAGNOSIS — J45909 Unspecified asthma, uncomplicated: Secondary | ICD-10-CM | POA: Diagnosis present

## 2011-01-28 DIAGNOSIS — N289 Disorder of kidney and ureter, unspecified: Secondary | ICD-10-CM | POA: Diagnosis present

## 2011-01-28 DIAGNOSIS — I10 Essential (primary) hypertension: Secondary | ICD-10-CM | POA: Diagnosis not present

## 2011-01-28 DIAGNOSIS — R197 Diarrhea, unspecified: Secondary | ICD-10-CM | POA: Diagnosis present

## 2011-01-28 DIAGNOSIS — E119 Type 2 diabetes mellitus without complications: Secondary | ICD-10-CM | POA: Diagnosis present

## 2011-01-28 DIAGNOSIS — K219 Gastro-esophageal reflux disease without esophagitis: Secondary | ICD-10-CM | POA: Diagnosis present

## 2011-01-28 DIAGNOSIS — Z853 Personal history of malignant neoplasm of breast: Secondary | ICD-10-CM

## 2011-01-28 DIAGNOSIS — E860 Dehydration: Principal | ICD-10-CM | POA: Diagnosis present

## 2011-01-28 DIAGNOSIS — M129 Arthropathy, unspecified: Secondary | ICD-10-CM | POA: Diagnosis present

## 2011-01-28 HISTORY — DX: Essential (primary) hypertension: I10

## 2011-01-28 HISTORY — DX: Pneumonia, unspecified organism: J18.9

## 2011-01-28 HISTORY — DX: Gastro-esophageal reflux disease without esophagitis: K21.9

## 2011-01-28 HISTORY — DX: Unspecified osteoarthritis, unspecified site: M19.90

## 2011-01-28 HISTORY — DX: Shortness of breath: R06.02

## 2011-01-28 HISTORY — DX: Malignant (primary) neoplasm, unspecified: C80.1

## 2011-01-28 LAB — URINALYSIS, ROUTINE W REFLEX MICROSCOPIC
Glucose, UA: NEGATIVE mg/dL
Ketones, ur: NEGATIVE mg/dL
Leukocytes, UA: NEGATIVE
Nitrite: NEGATIVE
Protein, ur: 100 mg/dL — AB
pH: 5 (ref 5.0–8.0)

## 2011-01-28 LAB — GLUCOSE, CAPILLARY
Glucose-Capillary: 96 mg/dL (ref 70–99)
Glucose-Capillary: 107 mg/dL — ABNORMAL HIGH (ref 70–99)

## 2011-01-28 LAB — COMPREHENSIVE METABOLIC PANEL
Alkaline Phosphatase: 79 U/L (ref 39–117)
BUN: 38 mg/dL — ABNORMAL HIGH (ref 6–23)
CO2: 22 mEq/L (ref 19–32)
Chloride: 100 mEq/L (ref 96–112)
Creatinine, Ser: 2.31 mg/dL — ABNORMAL HIGH (ref 0.50–1.10)
GFR calc non Af Amer: 20 mL/min — ABNORMAL LOW (ref >90)
Potassium: 3.8 mEq/L (ref 3.5–5.1)
Total Bilirubin: 0.4 mg/dL (ref 0.3–1.2)

## 2011-01-28 LAB — CBC
HCT: 38.3 % (ref 36.0–46.0)
HCT: 34.8 % — ABNORMAL LOW (ref 36.0–46.0)
Hemoglobin: 12.6 g/dL (ref 12.0–15.0)
MCH: 29.3 pg (ref 26.0–34.0)
MCV: 88.8 fL (ref 78.0–100.0)
RBC: 4.33 MIL/uL (ref 3.87–5.11)
RDW: 14 % (ref 11.5–15.5)
WBC: 6.1 10*3/uL (ref 4.0–10.5)
WBC: 4.9 10*3/uL (ref 4.0–10.5)

## 2011-01-28 LAB — DIFFERENTIAL
Lymphocytes Relative: 14 % (ref 12–46)
Lymphs Abs: 0.9 10*3/uL (ref 0.7–4.0)
Monocytes Absolute: 0.6 10*3/uL (ref 0.1–1.0)
Monocytes Relative: 11 % (ref 3–12)
Neutro Abs: 4.6 10*3/uL (ref 1.7–7.7)
Neutrophils Relative %: 75 % (ref 43–77)

## 2011-01-28 LAB — URINE MICROSCOPIC-ADD ON

## 2011-01-28 LAB — LIPASE, BLOOD: Lipase: 55 U/L (ref 11–59)

## 2011-01-28 LAB — CREATININE, SERUM: GFR calc Af Amer: 23 mL/min — ABNORMAL LOW (ref >90)

## 2011-01-28 MED ORDER — FLUTICASONE PROPIONATE 50 MCG/ACT NA SUSP
2.0000 | Freq: Every day | NASAL | Status: DC
  Administered 2011-01-29 – 2011-01-30 (×2): 2 via NASAL
  Filled 2011-01-28: qty 16

## 2011-01-28 MED ORDER — INSULIN ASPART 100 UNIT/ML ~~LOC~~ SOLN: 3.0000 [IU] | Freq: Three times a day (TID) | SUBCUTANEOUS | Status: DC

## 2011-01-28 MED ORDER — ONDANSETRON HCL 4 MG/2ML IJ SOLN: 4.0000 mg | Freq: Four times a day (QID) | INTRAMUSCULAR | Status: DC | PRN

## 2011-01-28 MED ORDER — ENOXAPARIN SODIUM 40 MG/0.4ML ~~LOC~~ SOLN
40.0000 mg | SUBCUTANEOUS | Status: DC
  Administered 2011-01-28: 40 mg via SUBCUTANEOUS
  Filled 2011-01-28 (×2): qty 0.4

## 2011-01-28 MED ORDER — INSULIN ASPART 100 UNIT/ML ~~LOC~~ SOLN
0.0000 [IU] | Freq: Three times a day (TID) | SUBCUTANEOUS | Status: DC
  Filled 2011-01-28: qty 3

## 2011-01-28 MED ORDER — AMITRIPTYLINE HCL 50 MG PO TABS
50.0000 mg | ORAL_TABLET | Freq: Every day | ORAL | Status: DC
  Administered 2011-01-28 – 2011-01-30 (×3): 50 mg via ORAL
  Filled 2011-01-28 (×4): qty 1

## 2011-01-28 MED ORDER — SODIUM CHLORIDE 0.9 % IV BOLUS (SEPSIS)
1000.0000 mL | Freq: Once | INTRAVENOUS | Status: AC
  Administered 2011-01-28: 1000 mL via INTRAVENOUS

## 2011-01-28 MED ORDER — ONDANSETRON HCL 4 MG PO TABS: 4.0000 mg | ORAL_TABLET | Freq: Four times a day (QID) | ORAL | Status: DC | PRN

## 2011-01-28 MED ORDER — ALUM & MAG HYDROXIDE-SIMETH 200-200-20 MG/5ML PO SUSP: 30.0000 mL | Freq: Four times a day (QID) | ORAL | Status: DC | PRN

## 2011-01-28 MED ORDER — INSULIN ASPART 100 UNIT/ML ~~LOC~~ SOLN: 0.0000 [IU] | Freq: Every day | SUBCUTANEOUS | Status: DC

## 2011-01-28 MED ORDER — PANTOPRAZOLE SODIUM 40 MG PO TBEC
40.0000 mg | DELAYED_RELEASE_TABLET | Freq: Every day | ORAL | Status: DC
  Administered 2011-01-28 – 2011-01-30 (×4): 40 mg via ORAL
  Filled 2011-01-28 (×2): qty 1

## 2011-01-28 MED ORDER — ACETAMINOPHEN 650 MG RE SUPP: 650.0000 mg | Freq: Four times a day (QID) | RECTAL | Status: DC | PRN

## 2011-01-28 MED ORDER — ASPIRIN EC 81 MG PO TBEC
81.0000 mg | DELAYED_RELEASE_TABLET | Freq: Every day | ORAL | Status: DC
  Administered 2011-01-29 – 2011-01-30 (×2): 81 mg via ORAL
  Filled 2011-01-28 (×4): qty 1

## 2011-01-28 MED ORDER — ONDANSETRON HCL 4 MG/2ML IJ SOLN
4.0000 mg | Freq: Once | INTRAMUSCULAR | Status: AC
  Administered 2011-01-28: 4 mg via INTRAVENOUS
  Filled 2011-01-28: qty 2

## 2011-01-28 MED ORDER — LORATADINE 10 MG PO TABS
10.0000 mg | ORAL_TABLET | Freq: Every day | ORAL | Status: DC
  Administered 2011-01-28 – 2011-01-30 (×3): 10 mg via ORAL
  Filled 2011-01-28 (×4): qty 1

## 2011-01-28 MED ORDER — ACETAMINOPHEN 325 MG PO TABS
650.0000 mg | ORAL_TABLET | Freq: Four times a day (QID) | ORAL | Status: DC | PRN
  Administered 2011-01-28 – 2011-01-30 (×5): 650 mg via ORAL
  Filled 2011-01-28 (×5): qty 2

## 2011-01-28 MED ORDER — SODIUM CHLORIDE 0.9 % IV SOLN
INTRAVENOUS | Status: DC
  Administered 2011-01-28: 19:00:00 via INTRAVENOUS
  Administered 2011-01-29: 150 mL/h via INTRAVENOUS
  Administered 2011-01-29: 09:00:00 via INTRAVENOUS

## 2011-01-28 MED ORDER — ZOLPIDEM TARTRATE 5 MG PO TABS: 5.0000 mg | ORAL_TABLET | Freq: Every evening | ORAL | Status: DC | PRN

## 2011-01-28 NOTE — H&P (Signed)
PATIENT DETAILS Name: Stacey Zamora Age: 75 y.o. Sex: female Date of Birth: Oct 17, 1935 Admit Date: 01/28/2011 ZOX:WRUEAVW,UJWJX A, MD   CHIEF COMPLAINT:  Nausea vomiting and diarrhea  HPI: 75 year old female with a past medical history significant for diabetes mellitus type 2 on metformin and hypertension presenting to the ED with 3 days of nausea, vomiting and diarrhea right after Thanksgiving dinner. The patient is feeling gradually weaker to the point that she is not able to get up from bed. She is not able to quantitate the number of bowel movements and vomiting. She denies fever, chills or night sweats. She feels lightheaded but denies focal weakness numbness or paresthesias. She denies exposure to sick contacts, travel history, hematemesis, hematochezia or melena. In the emergency department initial workup has revealed dehydration complicated by renal insufficiency and for these reasons we are asked to admit the patient. The patient has received 800 cc of normal saline IV bolus already and she states that she is feeling better. 2 granddaughters are at the bedside and they noticed a slight improvement. The patient denies history of renal disease  ALLERGIES:   Allergies  Allergen Reactions  . Morphine And Related Other (See Comments)    unknown  . Vicodin (Hydrocodone-Acetaminophen) Other (See Comments)    unknown    PAST MEDICAL HISTORY: Past Medical History  Diagnosis Date  . Hypertension   . Asthma   . Shortness of breath   . Cancer     right breast  . GERD (gastroesophageal reflux disease)   . Arthritis   . Pneumonia   . Diabetes mellitus   Tobacco use disorder  PAST SURGICAL HISTORY: Past Surgical History  Procedure Date  . Wrist surgery   . Breast surgery   . Abdominal hysterectomy   . Cholecystectomy   . Carpel tunnel     MEDICATIONS AT HOME: Prior to Admission medications   Medication Sig Start Date End Date Taking? Authorizing Provider  amitriptyline  (ELAVIL) 50 MG tablet Take 50 mg by mouth at bedtime.     Yes Historical Provider, MD  fluticasone (FLONASE) 50 MCG/ACT nasal spray Place 2 sprays into the nose daily.     Yes Historical Provider, MD  loratadine (CLARITIN) 10 MG tablet Take 10 mg by mouth daily.     Yes Historical Provider, MD  metFORMIN (GLUCOPHAGE) 500 MG tablet Take 500 mg by mouth 2 (two) times daily with a meal.     Yes Historical Provider, MD  nebivolol (BYSTOLIC) 5 MG tablet Take 5 mg by mouth daily.     Yes Historical Provider, MD  omeprazole (PRILOSEC) 20 MG capsule Take 40 mg by mouth daily.     Yes Historical Provider, MD    FAMILY HISTORY: History reviewed. No pertinent family history.  SOCIAL HISTORY:  reports that she has been smoking Cigarettes.  She has a 5 pack-year smoking history. She has quit using smokeless tobacco. She reports that she does not drink alcohol or use illicit drugs. Patient lives with her grandson. She is a full code  REVIEW OF SYSTEMS:  Constitutional: No  weight loss, night sweats,  Fevers, chills.  HEENT:    No headaches, Difficulty swallowing,Tooth/dental problems,Sore throat,  No sneezing, itching, ear ache, nasal congestion, post nasal drip,   Cardio-vascular: No chest pain,  Orthopnea, PND, swelling in lower extremities, anasarca,         dizziness, palpitations  GI:  As in history of present illness  Resp: No shortness of breath with exertion  or at rest.  No excess mucus, no productive cough, No non-productive cough,  No coughing up of blood.No change in color of mucus.No wheezing.  Skin:  no rash or lesions.  GU:  no dysuria, change in color of urine, no urgency or frequency.  No flank pain.  Musculoskeletal: No joint pain or swelling.  No decreased range of motion.  No back pain.     PHYSICAL EXAM: Blood pressure 110/68, pulse 76, temperature 98.2 F (36.8 C), temperature source Oral, resp. rate 16, SpO2 95.00%.  General appearance :Awake, alert, not in any  distress. Speech Clear. Not toxic Looking HEENT: Atraumatic and Normocephalic, pupils equally reactive to light and accomodation Neck: supple, no JVD. No cervical lymphadenopathy.  Chest:Good air entry bilaterally, no added sounds  CVS: S1 S2 regular, no murmurs.  Abdomen: Bowel sounds present, Non tender and not distended with no gurding, rigidity or rebound. Extremities: B/L Lower Ext shows no edema, both legs are warm to touch, with  dorsalis pedis pulses palpable. Neurology: Awake alert, and oriented X 3, CN II-XII intact, Non focal, Deep Tendon Reflex-2+ all over, plantar's downgoing B/L, sensory exam is grossly intact.  Skin:No Rash Wounds:N/A  LABS ON ADMISSION:   Basename 01/28/11 1125  NA 138  K 3.8  CL 100  CO2 22  GLUCOSE 124*  BUN 38*  CREATININE 2.31*  CALCIUM 8.7  MG --  PHOS --    Basename 01/28/11 1125  AST 35  ALT 11  ALKPHOS 79  BILITOT 0.4  PROT 7.8  ALBUMIN 3.7    Basename 01/28/11 1125  LIPASE 55  AMYLASE --    Basename 01/28/11 1125  WBC 6.1  NEUTROABS 4.6  HGB 12.6  HCT 38.3  MCV 88.5  PLT 201    Basename 01/28/11 1125  CKTOTAL --  CKMB --  CKMBINDEX --  TROPONINI <0.30   No results found for this basename: DDIMER:2 in the last 72 hours No results found for this basename: POCBNP:3 in the last 72 hours   RADIOLOGIC STUDIES ON ADMISSION: Dg Chest 2 View  01/28/2011  *RADIOLOGY REPORT*  Clinical Data: Cough, fever, dizziness, diarrhea, weakness and back pain.  CHEST - 2 VIEW  Comparison: 06/27/2010 and 02/20/2009 radiographs.  Findings: The heart size and mediastinal contours are stable. There is stable mild scarring at the right lung base.  The lungs are otherwise clear.  There is no pleural effusion.  Surgical clips are present within the right breast and right axilla.  No acute osseous findings are identified.  IMPRESSION: Stable mild chronic lung disease.  No acute cardiopulmonary process.  Original Report Authenticated By: Gerrianne Scale, M.D.    ASSESSMENT AND PLAN: Present on Admission:  .Dehydration: After normal saline IV bolus continue 150 cc an hour and monitor her input and output closely  .Renal insufficiency obtain renal ultrasound and UA. In a.m. repeat a BMP  .Gastroenteritis, noninfectious obtain stool sample for culture and sensitivity and C. difficile  .Diabetes type 2, controlled: Discontinue metformin in view of her renal insufficiency. Place on insulin sliding scale   Further plan will depend as patient's clinical course evolves and further radiologic and laboratory data become available. Patient will be monitored closely.   DVT Prophylaxis: Lovenox per protocol  Code Status: Full code  Total time spent for admission equals 45 minutes.  Jonny Ruiz 01/28/2011, 2:47 PM

## 2011-01-28 NOTE — ED Notes (Signed)
IV team with pt.

## 2011-01-28 NOTE — ED Notes (Signed)
Resting quietly in bed. Blood sugar checked per pt request. Results: 96. Meal tray ordered. Awaiting ready bed. NAD noted.

## 2011-01-28 NOTE — ED Provider Notes (Addendum)
History     CSN: [id number] Arrival date & time: 01/28/2011  9:45 AM   First MD Initiated Contact with Patient 01/28/11 [id number]      Chief Complaint  Patient presents with  . Nausea   patient had reported nausea, vomiting, and diarrhea for 3 days. She denied any fevers, but admitted to mild generalized weakness. She also had some upper respiratory congestion, but denied any chest pain.  She has not been needing for several days and not been taking her normal medications. Her only pain is in the area of her left wrist where she had surgery on October 22 for a ganglion cyst. Dysuria or back pain. Denies any recent sick contacts.    (Consider location/radiation/quality/duration/timing/severity/associated sxs/prior treatment) HPI  Past Medical History  Diagnosis Date  . Hypertension   . Cancer     Past Surgical History  Procedure Date  . Wrist surgery   . Breast surgery     No family history on file.  History  Substance Use Topics  . Smoking status: Not on file  . Smokeless tobacco: Not on file  . Alcohol Use:     OB History    Grav Para Term Preterm Abortions TAB SAB Ect Mult Living                  Review of Systems  All other systems reviewed and are negative.    Allergies  Morphine and related and Vicodin  Home Medications   Current Outpatient Rx  Name Route Sig Dispense Refill  . AMITRIPTYLINE HCL 50 MG PO TABS Oral Take 50 mg by mouth at bedtime.      Marland Kitchen FLUTICASONE PROPIONATE 50 MCG/ACT NA SUSP Nasal Place 2 sprays into the nose daily.      Marland Kitchen LORATADINE 10 MG PO TABS Oral Take 10 mg by mouth daily.      Marland Kitchen METFORMIN HCL 500 MG PO TABS Oral Take 500 mg by mouth 2 (two) times daily with a meal.      . NEBIVOLOL HCL 5 MG PO TABS Oral Take 5 mg by mouth daily.      Marland Kitchen OMEPRAZOLE 20 MG PO CPDR Oral Take 40 mg by mouth daily.        BP 105/65  Pulse 93  Temp(Src) 98.2 F (36.8 C) (Oral)  Resp 18  SpO2 96%  Physical Exam  Constitutional: She is oriented  to person, place, and time. She appears well-developed and well-nourished.  HENT:  Head: Normocephalic and atraumatic.  Eyes: Conjunctivae and EOM are normal. Pupils are equal, round, and reactive to light.  Neck: Neck supple.  Cardiovascular: Normal rate and regular rhythm.  Exam reveals no gallop and no friction rub.   No murmur heard. Pulmonary/Chest: Breath sounds normal. No respiratory distress. She has no wheezes. She has no rales. She exhibits no tenderness.       No Rales, rhonchi or wheezing, nonproductive cough noted  Abdominal: Soft. Bowel sounds are normal. She exhibits no distension. There is no tenderness. There is no rebound and no guarding.  Musculoskeletal: Normal range of motion. She exhibits no edema.       Tenderness to left wrist at incision site, no redness or swelling, 2+ peripheral pulses, equal and symmetric  Neurological: She is alert and oriented to person, place, and time. No cranial nerve deficit. Coordination normal.  Skin: Skin is warm and dry. No rash noted.  Psychiatric: She has a normal mood and affect.  ED Course  Procedures (including critical care time)  Labs Reviewed  COMPREHENSIVE METABOLIC PANEL - Abnormal; Notable for the following:    Glucose, Bld 124 (*)    BUN 38 (*)    Creatinine, Ser 2.31 (*)    GFR calc non Af Amer 20 (*)    GFR calc Af Amer 23 (*)    All other components within normal limits  CBC  DIFFERENTIAL  LIPASE, BLOOD  TROPONIN I  URINALYSIS, ROUTINE W REFLEX MICROSCOPIC   Dg Chest 2 View  01/28/2011  *RADIOLOGY REPORT*  Clinical Data: Cough, fever, dizziness, diarrhea, weakness and back pain.  CHEST - 2 VIEW  Comparison: 06/27/2010 and 02/20/2009 radiographs.  Findings: The heart size and mediastinal contours are stable. There is stable mild scarring at the right lung base.  The lungs are otherwise clear.  There is no pleural effusion.  Surgical clips are present within the right breast and right axilla.  No acute osseous  findings are identified.  IMPRESSION: Stable mild chronic lung disease.  No acute cardiopulmonary process.  Original Report Authenticated By: Gerrianne Scale, M.D.     No diagnosis found.    MDM  Pt is seen and examined;  Initial history and physical completed.  Will follow.          Webster Patrone A. Patrica Duel, MD 01/28/11 1002  Previous chart and records have been reviewed thoroughly. 10. In December of 2010 for right lower lobe pneumonia, COPD, hypertension, diabetes, transient, diarrhea, diabetic neuropathy, GERD  Collie Wernick A. Patrica Duel, MD 01/28/11 1005  Results for orders placed during the hospital encounter of 01/28/11  CBC      Component Value Range   WBC 6.1  4.0 - 10.5 (K/uL)   RBC 4.33  3.87 - 5.11 (MIL/uL)   Hemoglobin 12.6  12.0 - 15.0 (g/dL)   HCT 40.9  81.1 - 91.4 (%)   MCV 88.5  78.0 - 100.0 (fL)   MCH 29.1  26.0 - 34.0 (pg)   MCHC 32.9  30.0 - 36.0 (g/dL)   RDW 78.2  95.6 - 21.3 (%)   Platelets 201  150 - 400 (K/uL)  DIFFERENTIAL      Component Value Range   Neutrophils Relative 75  43 - 77 (%)   Neutro Abs 4.6  1.7 - 7.7 (K/uL)   Lymphocytes Relative 14  12 - 46 (%)   Lymphs Abs 0.9  0.7 - 4.0 (K/uL)   Monocytes Relative 11  3 - 12 (%)   Monocytes Absolute 0.6  0.1 - 1.0 (K/uL)   Eosinophils Relative 0  0 - 5 (%)   Eosinophils Absolute 0.0  0.0 - 0.7 (K/uL)   Basophils Relative 0  0 - 1 (%)   Basophils Absolute 0.0  0.0 - 0.1 (K/uL)  COMPREHENSIVE METABOLIC PANEL      Component Value Range   Sodium 138  135 - 145 (mEq/L)   Potassium 3.8  3.5 - 5.1 (mEq/L)   Chloride 100  96 - 112 (mEq/L)   CO2 22  19 - 32 (mEq/L)   Glucose, Bld 124 (*) 70 - 99 (mg/dL)   BUN 38 (*) 6 - 23 (mg/dL)   Creatinine, Ser 0.86 (*) 0.50 - 1.10 (mg/dL)   Calcium 8.7  8.4 - 57.8 (mg/dL)   Total Protein 7.8  6.0 - 8.3 (g/dL)   Albumin 3.7  3.5 - 5.2 (g/dL)   AST 35  0 - 37 (U/L)   ALT 11  0 - 35 (  U/L)   Alkaline Phosphatase 79  39 - 117 (U/L)   Total Bilirubin 0.4  0.3 - 1.2  (mg/dL)   GFR calc non Af Amer 20 (*) >90 (mL/min)   GFR calc Af Amer 23 (*) >90 (mL/min)  LIPASE, BLOOD      Component Value Range   Lipase 55  11 - 59 (U/L)  TROPONIN I      Component Value Range   Troponin I <0.30  <0.30 (ng/mL)   No results found.   Date: 01/28/2011  Rate: 77  Rhythm: normal sinus rhythm  QRS Axis: normal  Intervals: normal  ST/T Wave abnormalities: normal  Conduction Disutrbances:none  Narrative Interpretation:   Old EKG Reviewed: none available   Discussed with [facility].  Pt has been accepted for admission/transfer.  Stable.   Urine is Still pending, IV fluids running. Patient is stable at this time         Theron Arista A. Patrica Duel, MD 01/28/11 1248

## 2011-01-28 NOTE — ED Notes (Signed)
Called to give report. Was told nurse was in patient room. Awaiting return call.

## 2011-01-28 NOTE — ED Notes (Signed)
Patient was given a beverage and crackers while she waits for meal tray to arrive.

## 2011-01-28 NOTE — ED Notes (Signed)
Pt has had N/V/D x 3 days.  No fever, but pt states she is weak.  Upper Respiratory congestion.  Ems put O2 at 2 liters and pt stat at 98%.

## 2011-01-28 NOTE — ED Notes (Signed)
Called and spoke with IV team will attempt IV and blood draw.  Nurse and Lab attempted unable to start IV or draw labs.

## 2011-01-29 ENCOUNTER — Inpatient Hospital Stay (HOSPITAL_COMMUNITY): Payer: Medicare Other

## 2011-01-29 DIAGNOSIS — R197 Diarrhea, unspecified: Secondary | ICD-10-CM | POA: Diagnosis present

## 2011-01-29 LAB — BASIC METABOLIC PANEL
CO2: 23 mEq/L (ref 19–32)
Chloride: 107 mEq/L (ref 96–112)
Creatinine, Ser: 1.84 mg/dL — ABNORMAL HIGH (ref 0.50–1.10)

## 2011-01-29 LAB — GLUCOSE, CAPILLARY: Glucose-Capillary: 84 mg/dL (ref 70–99)

## 2011-01-29 MED ORDER — ENOXAPARIN SODIUM 30 MG/0.3ML ~~LOC~~ SOLN
30.0000 mg | SUBCUTANEOUS | Status: DC
  Administered 2011-01-29: 30 mg via SUBCUTANEOUS
  Filled 2011-01-29 (×2): qty 0.3

## 2011-01-29 NOTE — Clinical Documentation Improvement (Signed)
RENAL FAILURE DOCUMENTATION CLARIFICATION QUERY  THIS DOCUMENT IS NOT A PERMANENT PART OF THE MEDICAL RECORD  Please update your documentation within the medical record to reflect your response to this query.                                                                                     01/29/11  Dear Dr. [facility]   In a better effort to capture your patient's severity of illness, reflect appropriate length of stay and utilization of resources, a review of the patient medical record has revealed the following indicators.    Based on your clinical judgment, please clarify and document in a progress note and/or discharge summary the clinical condition associated with the following supporting information:  In responding to this query please exercise your independent judgment.  The fact that a query is asked, does not imply that any particular answer is desired or expected.   Possible Clinical Conditions?  _______Acute Renal Failure _______Acute Kidney Injury _______Acute Tubular Necrosis _______Acute Renal Cortical Necrosis _______Acute Renal Medullary Necrosis _______Acute on Chronic Renal Failure _______Chronic Renal Failure _______Anuria _______Oliguria _______Other Condition_____________ _______Cannot Clinically Determine     Supporting Information:  Risk Factors:  Per H&P = Pt. with dehydration complicated by renal insufficiency    Signs and Symptoms: N, V, D, generalized weakness   Diagnostics: -Lab: -Baseline Cr Level : 2.30 (01/28/11)  -Current Creatinine Level (trend): 1.84 (01/29/11)  -Baseline BUN Level :        -Current BUN Level (trend): 40 (01/29/11)  -Electrolytes: -GFR: 20, 23 (01/28/11) & 26, 30 (01/29/11)  Treatments:  IVF                     You may use possible, probable, or suspect with inpatient documentation. possible, probable, suspected diagnoses MUST be documented at the time of discharge  Reviewed: Pt. D/c'd,  no MD response to query.   02/03/11  DHC.   Thank You,  Sincerely, Shelda Pal RN, BSN, CCM  Clinical Documentation Specialist:  Pager [phone]  Health Information Management [place]  TO RESPOND TO THE THIS QUERY, FOLLOW THE INSTRUCTIONS BELOW:  1. If needed, update documentation for the patients encounter via the notes activity.  2. Access this query again and click edit on the [employer].  3. After updating, or not, click F2 to complete all highlighted (required) fields concerning your review. Select "additional documentation in the medical record" OR "no additional documentation provided".  4. Click Sign note button.  5. The deficiency will fall out of your InBasket *Please let us know if you are not able to compete this workflow by phone or e-mail (listed below).

## 2011-01-29 NOTE — Progress Notes (Signed)
Subjective: No further diarrhea. Pt has had several instances of urination today but urine has not been measured.Pt C/O non productive cough but denies any pain.   Objective: Filed Vitals:   01/28/11 2100 01/28/11 2204 01/29/11 0555 01/29/11 1300  BP: 124/63  125/67 107/61  Pulse: 71  78 81  Temp: 100.1 F (37.8 C)  101 F (38.3 C) 98.1 F (36.7 C)  TempSrc: Oral  Oral Oral  Resp: 18  18 20   Height:  5\' 3"  (1.6 m)    Weight:  85.73 kg (189 lb)    SpO2: 97%  100% 98%   Weight change:   Intake/Output Summary (Last 24 hours) at 01/29/11 1707 Last data filed at 01/29/11 1300  Gross per 24 hour  Intake    840 ml  Output      1 ml  Net    839 ml    General: Alert, awake, oriented x3, in no acute distress.  HEENT: [place]/AT PEERL, EOMI Neck: Trachea midline,  no masses, no thyromegal,y no JVD, no carotid bruit OROPHARYNX:  Moist, No exudate/ erythema/lesions.  Heart: Regular rate and rhythm, without murmurs, rubs, gallops, PMI non-displaced, no heaves or thrills on palpation.  Lungs: Clear to auscultation, no wheezing or rhonchi noted. No increased vocal fremitus resonant to percussion  Abdomen: Soft, nontender, nondistended, positive bowel sounds, no masses no hepatosplenomegaly noted..  Neuro: No focal neurological deficits noted cranial nerves II through XII grossly intact. DTRs 2+ bilaterally upper and lower extremities. Strength 5 out of 5 in bilateral upper and lower extremities. Musculoskeletal: No warm swelling or erythema around joints, no spinal tenderness noted. Psychiatric: Patient alert and oriented x3, good insight and cognition, good recent to remote recall. Lymph node survey: No cervical axillary or inguinal lymphadenopathy noted.     Lab Results:  Basename 01/29/11 0635 01/28/11 1825 01/28/11 1125  NA 141 -- 138  K 3.6 -- 3.8  CL 107 -- 100  CO2 23 -- 22  GLUCOSE 92 -- 124*  BUN 40* -- 38*  CREATININE 1.84* 2.30* --  CALCIUM 8.2* -- 8.7  MG -- -- --  PHOS  -- -- --    Basename 01/28/11 1125  AST 35  ALT 11  ALKPHOS 79  BILITOT 0.4  PROT 7.8  ALBUMIN 3.7    Basename 01/28/11 1125  LIPASE 55  AMYLASE --    Basename 01/28/11 1825 01/28/11 1125  WBC 4.9 6.1  NEUTROABS -- 4.6  HGB 11.5* 12.6  HCT 34.8* 38.3  MCV 88.8 88.5  PLT 176 201    Basename 01/28/11 1125  CKTOTAL --  CKMB --  CKMBINDEX --  TROPONINI <0.30   No results found for this basename: POCBNP:3 in the last 72 hours No results found for this basename: DDIMER:2 in the last 72 hours  Basename 01/28/11 1825  HGBA1C 6.1*   No results found for this basename: CHOL:2,HDL:2,LDLCALC:2,TRIG:2,CHOLHDL:2,LDLDIRECT:2 in the last 72 hours No results found for this basename: TSH,T4TOTAL,FREET3,T3FREE,THYROIDAB in the last 72 hours No results found for this basename: VITAMINB12:2,FOLATE:2,FERRITIN:2,TIBC:2,IRON:2,RETICCTPCT:2 in the last 72 hours  Micro Results: No results found for this or any previous visit (from the past 240 hour(s)).  Studies/Results: Dg Chest 2 View  01/28/2011  *RADIOLOGY REPORT*  Clinical Data: Cough, fever, dizziness, diarrhea, weakness and back pain.  CHEST - 2 VIEW  Comparison: 06/27/2010 and 02/20/2009 radiographs.  Findings: The heart size and mediastinal contours are stable. There is stable mild scarring at the right lung base.  The lungs  are otherwise clear.  There is no pleural effusion.  Surgical clips are present within the right breast and right axilla.  No acute osseous findings are identified.  IMPRESSION: Stable mild chronic lung disease.  No acute cardiopulmonary process.  Original Report Authenticated By: Gerrianne Scale, M.D.   US Renal  01/29/2011  *RADIOLOGY REPORT*  Clinical Data: Renal insufficiency.  RENAL/URINARY TRACT ULTRASOUND COMPLETE  Comparison:  11/02/2007 CT.  Findings:  Right Kidney:  10.0 cm. Normal size and echotexture.  No focal abnormality.  No hydronephrosis.  Left Kidney:  10.0 cm.  4.1 cm exophytic cyst off the  mid to lower pole which appears simple.  No hydronephrosis. Normal echotexture.  Bladder:  Normal for degree distention.  IMPRESSION: No acute findings.  No hydronephrosis.  Original Report Authenticated By: Cyndie Chime, M.D.    Medications: I have reviewed the patient's current medications. Scheduled Meds:   . amitriptyline  50 mg Oral QHS  . aspirin EC  81 mg Oral Daily  . enoxaparin (LOVENOX) injection  30 mg Subcutaneous Q24H  . fluticasone  2 spray Each Nare Daily  . insulin aspart  0-9 Units Subcutaneous TID WC  . loratadine  10 mg Oral Daily  . pantoprazole  40 mg Oral Q1200  . DISCONTD: enoxaparin  40 mg Subcutaneous Q24H  . DISCONTD: insulin aspart  0-5 Units Subcutaneous QHS  . DISCONTD: insulin aspart  3 Units Subcutaneous TID WC   Continuous Infusions:   . sodium chloride 150 mL/hr at 01/29/11 0906   PRN Meds:.acetaminophen, acetaminophen, alum & mag hydroxide-simeth, ondansetron (ZOFRAN) IV, ondansetron, zolpidem Assessment/Plan: Patient Active Hospital Problem List: Gastroenteritis, noninfectious (01/28/2011): [t has had no further diarrhea and no findings consistent with infectious etiology. Will advance diet and monitor hydration status.   Dehydration (01/28/2011): pt no longer clinically dehydrated. Mild renal insufficiency which is improving. Will continue hydration for now.  Renal insufficiency (01/28/2011):see above    Diabetes type 2, controlled (01/28/2011): Blood sugars low to normal. Will continue to monitor and apply correction dose insulin.    HTN(hypertension) (01/28/2011): B/P stable    Diarrhea (01/29/2011): resolved  DVT prophylaxis with Lovenox.      LOS: 1 day

## 2011-01-30 LAB — CBC
HCT: 32.6 % — ABNORMAL LOW (ref 36.0–46.0)
Hemoglobin: 10.3 g/dL — ABNORMAL LOW (ref 12.0–15.0)
MCHC: 31.6 g/dL (ref 30.0–36.0)
MCV: 90.1 fL (ref 78.0–100.0)
RDW: 13.8 % (ref 11.5–15.5)

## 2011-01-30 LAB — GLUCOSE, CAPILLARY
Glucose-Capillary: 77 mg/dL (ref 70–99)
Glucose-Capillary: 74 mg/dL (ref 70–99)
Glucose-Capillary: 65 mg/dL — ABNORMAL LOW (ref 70–99)
Glucose-Capillary: 90 mg/dL (ref 70–99)

## 2011-01-30 LAB — DIFFERENTIAL
Basophils Absolute: 0 10*3/uL (ref 0.0–0.1)
Basophils Relative: 1 % (ref 0–1)
Eosinophils Relative: 3 % (ref 0–5)
Lymphocytes Relative: 37 % (ref 12–46)
Monocytes Absolute: 0.4 10*3/uL (ref 0.1–1.0)
Monocytes Relative: 9 % (ref 3–12)
Neutro Abs: 2 10*3/uL (ref 1.7–7.7)

## 2011-01-30 LAB — BASIC METABOLIC PANEL
BUN: 24 mg/dL — ABNORMAL HIGH (ref 6–23)
CO2: 23 mEq/L (ref 19–32)
Calcium: 8.1 mg/dL — ABNORMAL LOW (ref 8.4–10.5)
Chloride: 107 mEq/L (ref 96–112)
Creatinine, Ser: 1.14 mg/dL — ABNORMAL HIGH (ref 0.50–1.10)

## 2011-01-30 LAB — CLOSTRIDIUM DIFFICILE BY PCR: Toxigenic C. Difficile by PCR: NEGATIVE

## 2011-01-30 MED ORDER — BENZONATATE 100 MG PO CAPS
100.0000 mg | ORAL_CAPSULE | Freq: Three times a day (TID) | ORAL | Status: DC | PRN
  Administered 2011-01-30: 100 mg via ORAL
  Filled 2011-01-30 (×2): qty 1

## 2011-01-30 MED ORDER — GLUCERNA SHAKE PO LIQD
237.0000 mL | Freq: Three times a day (TID) | ORAL | Status: DC
  Administered 2011-01-30: 237 mL via ORAL

## 2011-01-30 MED ORDER — ENOXAPARIN SODIUM 40 MG/0.4ML ~~LOC~~ SOLN
40.0000 mg | SUBCUTANEOUS | Status: DC
  Administered 2011-01-30: 40 mg via SUBCUTANEOUS
  Filled 2011-01-30 (×2): qty 0.4

## 2011-01-30 MED ORDER — POTASSIUM CHLORIDE CRYS ER 20 MEQ PO TBCR
40.0000 meq | EXTENDED_RELEASE_TABLET | Freq: Two times a day (BID) | ORAL | Status: DC
  Administered 2011-01-30 (×2): 40 meq via ORAL
  Filled 2011-01-30 (×4): qty 2

## 2011-01-30 NOTE — Progress Notes (Signed)
Subjective: No further diarrhea and she had a formed stool this morning which was sent for C. difficile PCR appear. Patient has had good urine output and has been tolerating her oral intake well. The patient states that she normally takes Glucerna to augment her oral intake when her appetite is not as well as it should be. She is requesting Glucerna supplementation.   Objective: Filed Vitals:   01/29/11 1300 01/29/11 2127 01/30/11 0500 01/30/11 1459  BP: 107/61 156/63 168/70 145/68  Pulse: 81 88 80 81  Temp: 98.1 F (36.7 C) 98.7 F (37.1 C) 99.5 F (37.5 C) 98.7 F (37.1 C)  TempSrc: Oral Oral Oral Oral  Resp: 20 20 22 20   Height:      Weight:      SpO2: 98% 93% 90% 89%   Weight change:   Intake/Output Summary (Last 24 hours) at 01/30/11 1933 Last data filed at 01/30/11 1900  Gross per 24 hour  Intake   1966 ml  Output   1400 ml  Net    566 ml    General: Alert, awake, oriented x3, in no acute distress.  HEENT: [place]/AT PEERL, EOMI Neck: Trachea midline,  no masses, no thyromegal,y no JVD, no carotid bruit OROPHARYNX:  Moist, No exudate/ erythema/lesions.  Heart: Regular rate and rhythm, without murmurs, rubs, gallops, PMI non-displaced, no heaves or thrills on palpation.  Lungs: Clear to auscultation, no wheezing or rhonchi noted. No increased vocal fremitus resonant to percussion  Abdomen: Soft, nontender, nondistended, positive bowel sounds, no masses no hepatosplenomegaly noted..  Neuro: No focal neurological deficits noted cranial nerves II through XII grossly intact. DTRs 2+ bilaterally upper and lower extremities. Strength 5 out of 5 in bilateral upper and lower extremities. Musculoskeletal: No warm swelling or erythema around joints, no spinal tenderness noted. Psychiatric: Patient alert and oriented x3, good insight and cognition, good recent to remote recall. Lymph node survey: No cervical axillary or inguinal lymphadenopathy noted.     Lab Results:  [facility]  01/30/11 0510 01/29/11 0635  NA 139 141  K 3.5 3.6  CL 107 107  CO2 23 23  GLUCOSE 104* 92  BUN 24* 40*  CREATININE 1.14* 1.84*  CALCIUM 8.1* 8.2*  MG -- --  PHOS -- --    Basename 01/28/11 1125  AST 35  ALT 11  ALKPHOS 79  BILITOT 0.4  PROT 7.8  ALBUMIN 3.7    Basename 01/28/11 1125  LIPASE 55  AMYLASE --    Basename 01/30/11 0510 01/28/11 1825 01/28/11 1125  WBC 4.0 4.9 --  NEUTROABS 2.0 -- 4.6  HGB 10.3* 11.5* --  HCT 32.6* 34.8* --  MCV 90.1 88.8 --  PLT 148* 176 --    Basename 01/28/11 1125  CKTOTAL --  CKMB --  CKMBINDEX --  TROPONINI <0.30   No results found for this basename: POCBNP:3 in the last 72 hours No results found for this basename: DDIMER:2 in the last 72 hours  Basename 01/28/11 1825  HGBA1C 6.1*   No results found for this basename: CHOL:2,HDL:2,LDLCALC:2,TRIG:2,CHOLHDL:2,LDLDIRECT:2 in the last 72 hours No results found for this basename: TSH,T4TOTAL,FREET3,T3FREE,THYROIDAB in the last 72 hours No results found for this basename: VITAMINB12:2,FOLATE:2,FERRITIN:2,TIBC:2,IRON:2,RETICCTPCT:2 in the last 72 hours  Micro Results: Recent Results (from the past 240 hour(s))  CLOSTRIDIUM DIFFICILE BY PCR     Status: Normal   Collection Time   01/30/11 11:46 AM      Component Value Range Status Comment   C difficile by pcr NEGATIVE  NEGATIVE  Final     Studies/Results: Dg Chest 2 View  01/28/2011  *RADIOLOGY REPORT*  Clinical Data: Cough, fever, dizziness, diarrhea, weakness and back pain.  CHEST - 2 VIEW  Comparison: 06/27/2010 and 02/20/2009 radiographs.  Findings: The heart size and mediastinal contours are stable. There is stable mild scarring at the right lung base.  The lungs are otherwise clear.  There is no pleural effusion.  Surgical clips are present within the right breast and right axilla.  No acute osseous findings are identified.  IMPRESSION: Stable mild chronic lung disease.  No acute cardiopulmonary process.  Original Report  Authenticated By: Gerrianne Scale, M.D.   US Renal  01/29/2011  *RADIOLOGY REPORT*  Clinical Data: Renal insufficiency.  RENAL/URINARY TRACT ULTRASOUND COMPLETE  Comparison:  11/02/2007 CT.  Findings:  Right Kidney:  10.0 cm. Normal size and echotexture.  No focal abnormality.  No hydronephrosis.  Left Kidney:  10.0 cm.  4.1 cm exophytic cyst off the mid to lower pole which appears simple.  No hydronephrosis. Normal echotexture.  Bladder:  Normal for degree distention.  IMPRESSION: No acute findings.  No hydronephrosis.  Original Report Authenticated By: Cyndie Chime, M.D.    Medications: I have reviewed the patient's current medications. Scheduled Meds:    . amitriptyline  50 mg Oral QHS  . aspirin EC  81 mg Oral Daily  . enoxaparin (LOVENOX) injection  40 mg Subcutaneous Q24H  . feeding supplement  237 mL Oral TID WC  . fluticasone  2 spray Each Nare Daily  . insulin aspart  0-9 Units Subcutaneous TID WC  . loratadine  10 mg Oral Daily  . pantoprazole  40 mg Oral Q1200  . potassium chloride  40 mEq Oral BID  . DISCONTD: enoxaparin (LOVENOX) injection  30 mg Subcutaneous Q24H   Continuous Infusions:    . DISCONTD: sodium chloride 150 mL/hr at 01/29/11 0906   PRN Meds:.acetaminophen, acetaminophen, alum & mag hydroxide-simeth, benzonatate, ondansetron (ZOFRAN) IV, ondansetron, zolpidem Assessment/Plan: Patient Active Hospital Problem List: Gastroenteritis, noninfectious (01/28/2011): [t has had no further diarrhea and no findings consistent with infectious etiology. Tolerating diet well.   Dehydration (01/28/2011): pt no longer clinically dehydrated. Mild renal insufficiency which is improving. Will DC IV fluid.  Renal insufficiency (01/28/2011):see above    Diabetes type 2, controlled (01/28/2011): Blood sugars low to normal. Will continue to monitor and apply correction dose insulin.    HTN(hypertension) (01/28/2011): B/P stable    Diarrhea (01/29/2011): resolved  DVT  prophylaxis with Lovenox.      LOS: 2 days

## 2011-01-30 NOTE — Progress Notes (Signed)
   CARE MANAGEMENT NOTE 01/30/2011  Patient:  [facility]   Account Number:  [id number]  Date Initiated:  01/30/2011  Documentation initiated by:  Taffany Heiser  Subjective/Objective Assessment:   PT WAS ADMITTED WITH DEHYDRATION, N/V.     Action/Plan:   PROGRESSION OF CARE AND DISCHARGE PLANNING   Anticipated DC Date:  02/03/2011   Anticipated DC Plan:  HOME/SELF CARE      DC Planning Services  CM consult      Choice offered to / List presented to:             Status of service:  In process, will continue to follow Medicare Important Message given?   (If response is "NO", the following Medicare IM given date fields will be blank) Date Medicare IM given:   Date Additional Medicare IM given:    Discharge Disposition:    Per UR Regulation:  Reviewed for med. necessity/level of care/duration of stay  Comments:  UR COMPLETED 01/30/2011 Onnie Boer, RN, BSN 1514 PT WAS ADMITTED WITH DEHYDRATION, N/V.  PTA PT WAS AT HOME WITH HER GRANDSON STAYING AT NIGHT.  SHE STATES THAT SHE LIVES BESIDE HER DAUGHTER AND SHE HAS AN AIDE TO COME IN FROM [place] IN [place] EVERY DAY.  WILL F/U ON [facility] NEEDS

## 2011-01-31 LAB — GLUCOSE, CAPILLARY: Glucose-Capillary: 155 mg/dL — ABNORMAL HIGH (ref 70–99)

## 2011-01-31 LAB — BASIC METABOLIC PANEL
BUN: 11 mg/dL (ref 6–23)
Calcium: 8.7 mg/dL (ref 8.4–10.5)
Creatinine, Ser: 0.92 mg/dL (ref 0.50–1.10)
GFR calc Af Amer: 70 mL/min — ABNORMAL LOW (ref >90)
GFR calc non Af Amer: 60 mL/min — ABNORMAL LOW (ref >90)

## 2011-01-31 LAB — FECAL LACTOFERRIN, QUANT: Fecal Lactoferrin: POSITIVE

## 2011-01-31 MED ORDER — BENZONATATE 100 MG PO CAPS: 200.0000 mg | ORAL_CAPSULE | Freq: Three times a day (TID) | ORAL | Status: AC | PRN

## 2011-01-31 MED ORDER — METFORMIN HCL 500 MG PO TABS
500.0000 mg | ORAL_TABLET | Freq: Two times a day (BID) | ORAL | Status: DC
  Filled 2011-01-31 (×2): qty 1

## 2011-01-31 MED ORDER — NEBIVOLOL HCL 5 MG PO TABS
5.0000 mg | ORAL_TABLET | Freq: Every day | ORAL | Status: DC
  Filled 2011-01-31: qty 1

## 2011-01-31 NOTE — Discharge Summary (Signed)
Stacey Zamora MRN: [id number] DOB/AGE: Apr 22, 1935 75 y.o.  Admit date: 01/28/2011 Discharge date: 01/31/2011  Primary Care Physician:  Dorrene German, MD   Discharge Diagnoses:   Patient Active Problem List  Diagnoses  . Dehydration  . Renal insufficiency  . Gastroenteritis, noninfectious  . Diabetes type 2, controlled  . HTN (hypertension)  . Diarrhea    DISCHARGE MEDICATION: Current Discharge Medication List    CONTINUE these medications which have NOT CHANGED   Details  amitriptyline (ELAVIL) 50 MG tablet Take 50 mg by mouth at bedtime.      fluticasone (FLONASE) 50 MCG/ACT nasal spray Place 2 sprays into the nose daily.      loratadine (CLARITIN) 10 MG tablet Take 10 mg by mouth daily.      metFORMIN (GLUCOPHAGE) 500 MG tablet Take 500 mg by mouth 2 (two) times daily with a meal.      nebivolol (BYSTOLIC) 5 MG tablet Take 5 mg by mouth daily.      omeprazole (PRILOSEC) 20 MG capsule Take 40 mg by mouth daily.             Consults:     SIGNIFICANT DIAGNOSTIC STUDIES:  Dg Chest 2 View  01/28/2011  *RADIOLOGY REPORT*  Clinical Data: Cough, fever, dizziness, diarrhea, weakness and back pain.  CHEST - 2 VIEW  Comparison: 06/27/2010 and 02/20/2009 radiographs.  Findings: The heart size and mediastinal contours are stable. There is stable mild scarring at the right lung base.  The lungs are otherwise clear.  There is no pleural effusion.  Surgical clips are present within the right breast and right axilla.  No acute osseous findings are identified.  IMPRESSION: Stable mild chronic lung disease.  No acute cardiopulmonary process.  Original Report Authenticated By: Gerrianne Scale, M.D.   US Renal  01/29/2011  *RADIOLOGY REPORT*  Clinical Data: Renal insufficiency.  RENAL/URINARY TRACT ULTRASOUND COMPLETE  Comparison:  11/02/2007 CT.  Findings:  Right Kidney:  10.0 cm. Normal size and echotexture.  No focal abnormality.  No hydronephrosis.  Left Kidney:  10.0 cm.  4.1  cm exophytic cyst off the mid to lower pole which appears simple.  No hydronephrosis. Normal echotexture.  Bladder:  Normal for degree distention.  IMPRESSION: No acute findings.  No hydronephrosis.  Original Report Authenticated By: Cyndie Chime, M.D.     Recent Results (from the past 240 hour(s))  CLOSTRIDIUM DIFFICILE BY PCR     Status: Normal   Collection Time   01/30/11 11:46 AM      Component Value Range Status Comment   C difficile by pcr NEGATIVE  NEGATIVE  Final     BRIEF ADMITTING H & P: 75 year old female with a past medical history significant for diabetes mellitus type 2 on metformin and hypertension presenting to the ED with 3 days of nausea, vomiting and diarrhea right after Thanksgiving dinner. The patient is feeling gradually weaker to the point that she is not able to get up from bed. She is not able to quantitate the number of bowel movements and vomiting. She denies fever, chills or night sweats. She feels lightheaded but denies focal weakness numbness or paresthesias. She denies exposure to sick contacts, travel history, hematemesis, hematochezia or melena.  In the emergency department initial workup has revealed dehydration complicated by renal insufficiency and for these reasons we are asked to admit the patient. The patient has received 800 cc of normal saline IV bolus and immediately started feeling better feeling better. The patient  denies history of renal disease    Hospital Course:  Present on Admission:  .Dehydration: Pt was mildly dehydrated which improved with IVF. Now resolved. .Renal insufficiency: resolved with hydration .Gastroenteritis-noninfectious: Has resolved and patient now tolerating diet without problems. C.diff (-) .Diabetes type 2-controlled: Metformin was held during this hospitalization d/t renal insuffeciency. It will be resumed upon d/c. Marland KitchenDiarrhea: resolved  Disposition and Follow-up: F/U with Dr. Concepcion Elk in 5-7 days  DISCHARGE  EXAM: Blood pressure 175/79, pulse 78, temperature 98.1 F (36.7 C), temperature source Oral, resp. rate 22, height 5\' 3"  (1.6 m), weight 85.73 kg (189 lb), SpO2 99.00%. General: Alert, awake, oriented x3, in no acute distress.  HEENT: [place]/AT PEERL, EOMI  Neck: Trachea midline, no masses, no thyromegal,y no JVD, no carotid bruit  OROPHARYNX: Moist, No exudate/ erythema/lesions.  Heart: Regular rate and rhythm, without murmurs, rubs, gallops, PMI non-displaced, no heaves or thrills on palpation.  Lungs: Clear to auscultation, no wheezing or rhonchi noted. No increased vocal fremitus resonant to percussion  Abdomen: Soft, nontender, nondistended, positive bowel sounds, no masses no hepatosplenomegaly noted..  Neuro: No focal neurological deficits noted cranial nerves II through XII grossly intact. DTRs 2+ bilaterally upper and lower extremities. Musculoskeletal: No warm swelling or erythema around joints, no spinal tenderness noted.  Psychiatric: Patient alert and oriented x3, good insight and cognition, good recent to remote recall.  Lymph node survey: No cervical axillary or inguinal lymphadenopathy noted.    Basename 01/31/11 0725 01/30/11 0510  NA 140 139  K 4.4 3.5  CL 108 107  CO2 22 23  GLUCOSE 87 104*  BUN 11 24*  CREATININE 0.92 1.14*  CALCIUM 8.7 8.1*  MG -- --  PHOS -- --    Basename 01/28/11 1125  AST 35  ALT 11  ALKPHOS 79  BILITOT 0.4  PROT 7.8  ALBUMIN 3.7    Basename 01/28/11 1125  LIPASE 55  AMYLASE --    Basename 01/30/11 0510 01/28/11 1825 01/28/11 1125  WBC 4.0 4.9 --  NEUTROABS 2.0 -- 4.6  HGB 10.3* 11.5* --  HCT 32.6* 34.8* --  MCV 90.1 88.8 --  PLT 148* 176 --   Total time taken  In D/C process including face to face time is 40 minutes.  Signed: MATTHEWS,[name] A. 01/31/2011, 11:04 AM

## 2011-02-03 LAB — STOOL CULTURE

## 2011-05-02 IMAGING — CR DG CHEST 2V
2 series · 2 of 2 positions shown · non-contrast
Comparison: 05/06/2008

CLINICAL DATA: Cough, shortness of breath, bronchitis, past history
of breast cancer per database search

CHEST - 2 VIEW

[view not recorded (1 of 2)]
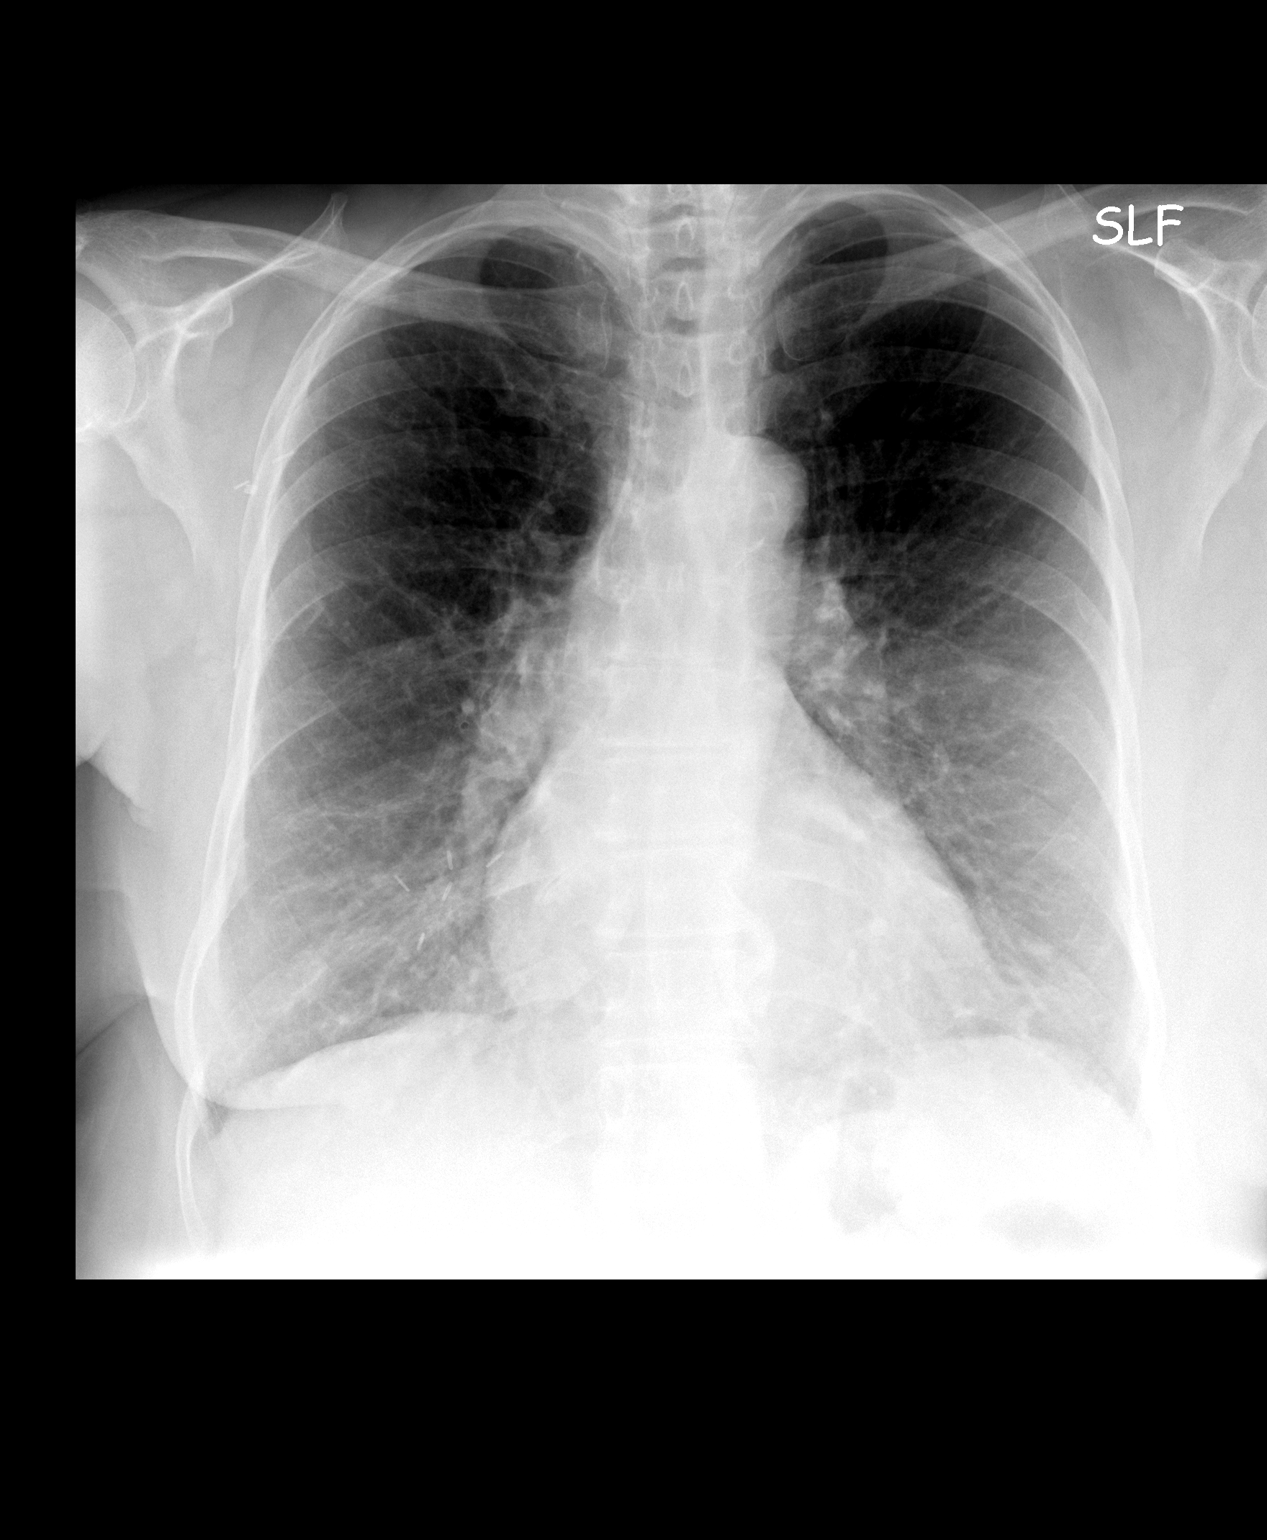

[view not recorded (2 of 2)]
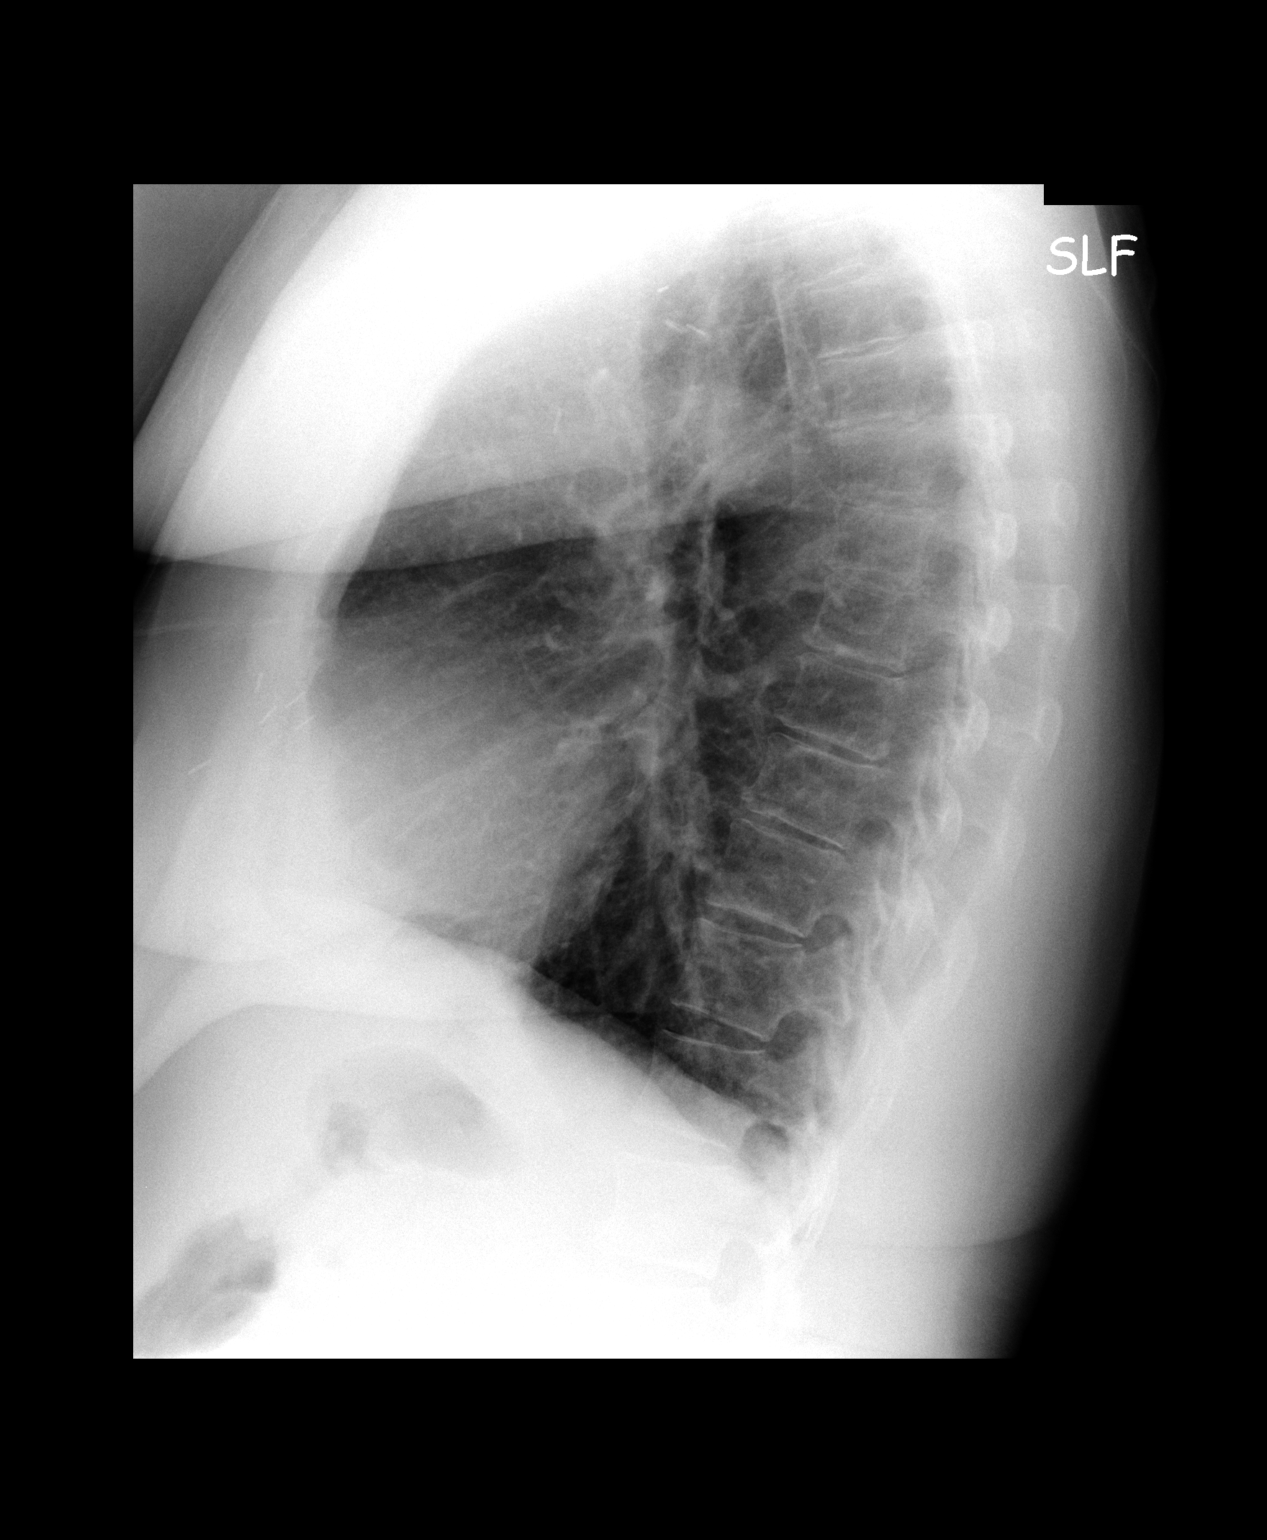

[2 of 2 positions shown; findings below may reference images not displayed]

FINDINGS: Cardiac enlargement.
Normal mediastinal contours and pulmonary vascularity.
Atherosclerotic calcifications aortic arch.
Mild bronchitic changes without infiltrate or effusion.
Underlying COPD.
Minimal bibasilar atelectasis or scarring.
Bones appear mildly demineralized.
Surgical clips right breast and right axilla.
IMPRESSION: COPD with bronchitic changes and minimal bibasilar atelectasis or
scarring.
No acute abnormalities.

## 2013-09-25 ENCOUNTER — Encounter (HOSPITAL_COMMUNITY): Payer: Self-pay | Admitting: Emergency Medicine

## 2013-09-25 ENCOUNTER — Emergency Department (INDEPENDENT_AMBULATORY_CARE_PROVIDER_SITE_OTHER)
Admission: EM | Admit: 2013-09-25 | Discharge: 2013-09-25 | Disposition: A | Discharge status: Home / Self Care | Payer: Medicare Other | Source: Home / Self Care | Attending: Family Medicine | Admitting: Family Medicine

## 2013-09-25 DIAGNOSIS — Y92009 Unspecified place in unspecified non-institutional (private) residence as the place of occurrence of the external cause: Secondary | ICD-10-CM

## 2013-09-25 DIAGNOSIS — T300 Burn of unspecified body region, unspecified degree: Secondary | ICD-10-CM

## 2013-09-25 MED ORDER — SILVER SULFADIAZINE 1 % EX CREA
TOPICAL_CREAM | Freq: Once | CUTANEOUS | Status: AC
  Administered 2013-09-25: 11:00:00 via TOPICAL

## 2013-09-25 MED ORDER — TRAMADOL HCL 50 MG PO TABS: 50.0000 mg | ORAL_TABLET | Freq: Four times a day (QID) | ORAL | Status: AC | PRN

## 2013-09-25 NOTE — ED Notes (Signed)
Patient states grabbed a frying pan off the stove Not realizing the pan hat hot oil still in it and it spilled onto The top of her right foot burning the foot

## 2013-09-25 NOTE — ED Provider Notes (Signed)
Stacey Zamora is a 78 y.o. female who presents to Urgent Care today for right foot pain. Patient spelled hot oil on her dorsal right foot today while cooking. She was wearing foot flaps. She notes pain. She denies any radiating pain weakness or numbness fevers or chills. She feels well otherwise. She has not tried any treatment yet.   Past Medical History  Diagnosis Date  . Hypertension   . Asthma   . Shortness of breath   . Cancer     right breast  . GERD (gastroesophageal reflux disease)   . Arthritis   . Pneumonia   . Diabetes mellitus    History  Substance Use Topics  . Smoking status: Current Every Day Smoker -- 0.25 packs/day for 20 years    Types: Cigarettes  . Smokeless tobacco: Former Systems developer  . Alcohol Use: No   ROS as above Medications: No current facility-administered medications for this encounter.   Current Outpatient Prescriptions  Medication Sig Dispense Refill  . amitriptyline (ELAVIL) 50 MG tablet Take 50 mg by mouth at bedtime.        . fluticasone (FLONASE) 50 MCG/ACT nasal spray Place 2 sprays into the nose daily.        Marland Kitchen loratadine (CLARITIN) 10 MG tablet Take 10 mg by mouth daily.        . metFORMIN (GLUCOPHAGE) 500 MG tablet Take 500 mg by mouth 2 (two) times daily with a meal.        . nebivolol (BYSTOLIC) 5 MG tablet Take 5 mg by mouth daily.        Marland Kitchen omeprazole (PRILOSEC) 20 MG capsule Take 40 mg by mouth daily.        . traMADol (ULTRAM) 50 MG tablet Take 1 tablet (50 mg total) by mouth every 6 (six) hours as needed.  10 tablet  0    Exam:  BP 147/64  Pulse 76  Temp(Src) 98.2 F (36.8 C) (Oral)  Resp 20  SpO2 100% Gen: Well NAD  Right foot: Normal-appearing no blistering or skin sloughing. Tender to palpation dorsal distal foot. Pulses capillary refill and sensation are intact. Foot and toe motion are normal.  No results found for this or any previous visit (from the past 24 hour(s)). No results found.  Assessment and Plan: 78 y.o. female  with first degree burn right foot. Wound dressed with Silvadene. Recommend Vaseline on further. Tramadol for pain. Followup with primary care provider  Discussed warning signs or symptoms. Please see discharge instructions. Patient expresses understanding.   This note was created using Systems analyst. Any transcription errors are unintended.    Gregor Hams, MD 09/25/13 1110

## 2013-09-25 NOTE — Discharge Instructions (Signed)
Thank you for coming in today. Use tramadol for severe pain.  Apply Vaseline starting tomorrow.  Follow up with your doctor.  Come back as needed.    Burn Care Your skin is a natural barrier to infection. It is the largest organ of your body. Burns damage this natural protection. To help prevent infection, it is very important to follow your caregiver's instructions in the care of your burn. Burns are classified as:  First degree. There is only redness of the skin (erythema). No scarring is expected.  Second degree. There is blistering of the skin. Scarring may occur with deeper burns.  Third degree. All layers of the skin are injured, and scarring is expected. HOME CARE INSTRUCTIONS   Wash your hands well before changing your bandage.  Change your bandage as often as directed by your caregiver.  Remove the old bandage. If the bandage sticks, you may soak it off with cool, clean water.  Cleanse the burn thoroughly but gently with mild soap and water.  Pat the area dry with a clean, dry cloth.  Apply a thin layer of antibacterial cream to the burn.  Apply a clean bandage as instructed by your caregiver.  Keep the bandage as clean and dry as possible.  Elevate the affected area for the first 24 hours, then as instructed by your caregiver.  Only take over-the-counter or prescription medicines for pain, discomfort, or fever as directed by your caregiver. SEEK IMMEDIATE MEDICAL CARE IF:   You develop excessive pain.  You develop redness, tenderness, swelling, or red streaks near the burn.  The burned area develops yellowish-white fluid (pus) or a bad smell.  You have a fever. MAKE SURE YOU:   Understand these instructions.  Will watch your condition.  Will get help right away if you are not doing well or get worse. Document Released: 02/17/2005 Document Revised: 05/12/2011 Document Reviewed: 07/10/2010 [facility] Patient Information 2015 [place], [place]. This  information is not intended to replace advice given to you by your health care provider. Make sure you discuss any questions you have with your health care provider.

## 2014-08-14 ENCOUNTER — Emergency Department (INDEPENDENT_AMBULATORY_CARE_PROVIDER_SITE_OTHER)
Admission: EM | Admit: 2014-08-14 | Discharge: 2014-08-14 | Disposition: A | Discharge status: Home / Self Care | Payer: Medicare Other | Source: Home / Self Care | Attending: Family Medicine | Admitting: Family Medicine

## 2014-08-14 ENCOUNTER — Emergency Department (INDEPENDENT_AMBULATORY_CARE_PROVIDER_SITE_OTHER): Payer: Medicare Other

## 2014-08-14 ENCOUNTER — Encounter (HOSPITAL_COMMUNITY): Payer: Self-pay | Admitting: Emergency Medicine

## 2014-08-14 DIAGNOSIS — M79672 Pain in left foot: Secondary | ICD-10-CM | POA: Diagnosis not present

## 2014-08-14 NOTE — ED Provider Notes (Signed)
Stacey Zamora is a 79 y.o. female who presents to Urgent Care today for left foot pain. Patient tripped and fell 5 days ago. She notes continued foot and ankle pain. She has bilateral foot pain over the left is much worse than the right. The right pain is intermittent and mild. The left foot pain is located on the dorsal and plantar mid foot and into the ankle. No radiating pain weakness or numbness. She notes some redness that is resolving. Pain is worse with ambulation. She has tried some topical creams which have not helped much.  The patient notes that she has redness on both feet that is resolving. They're very concerned about it. She's been applying different over-the-counter creams and patches to help control her pain.    Past Medical History  Diagnosis Date  . Hypertension   . Asthma   . Shortness of breath   . Cancer     right breast  . GERD (gastroesophageal reflux disease)   . Arthritis   . Pneumonia   . Diabetes mellitus    Past Surgical History  Procedure Laterality Date  . Wrist surgery    . Breast surgery    . Abdominal hysterectomy    . Cholecystectomy    . Carpel tunnel     History  Substance Use Topics  . Smoking status: Current Every Day Smoker -- 0.25 packs/day for 20 years    Types: Cigarettes  . Smokeless tobacco: Former Systems developer  . Alcohol Use: No   ROS as above Medications: No current facility-administered medications for this encounter.   Current Outpatient Prescriptions  Medication Sig Dispense Refill  . amitriptyline (ELAVIL) 50 MG tablet Take 50 mg by mouth at bedtime.      . fluticasone (FLONASE) 50 MCG/ACT nasal spray Place 2 sprays into the nose daily.      Marland Kitchen loratadine (CLARITIN) 10 MG tablet Take 10 mg by mouth daily.      . metFORMIN (GLUCOPHAGE) 500 MG tablet Take 500 mg by mouth 2 (two) times daily with a meal.      . nebivolol (BYSTOLIC) 5 MG tablet Take 5 mg by mouth daily.      Marland Kitchen omeprazole (PRILOSEC) 20 MG capsule Take 40 mg by mouth  daily.      . traMADol (ULTRAM) 50 MG tablet Take 1 tablet (50 mg total) by mouth every 6 (six) hours as needed. 10 tablet 0   Allergies  Allergen Reactions  . Morphine And Related Other (See Comments)    unknown  . Vicodin [Hydrocodone-Acetaminophen] Other (See Comments)    unknown     Exam:  BP 160/68 mmHg  Pulse 67  Temp(Src) 98.2 F (36.8 C) (Oral)  Resp 16  SpO2 100% Gen: Well NAD HEENT: EOMI,  MMM Lungs: Normal work of breathing. CTABL Heart: RRR no MRG Abd: NABS, Soft. Nondistended, Nontender Exts: Brisk capillary refill, warm and well perfused.  Foot: Left Pinkish tinge to the plantar foot and ankle area the tips of the nails are also pink. The foot itself is normal-appearing with no swelling. Pulses are intact. Tender palpation plantar calcaneus along the course of the plantar fascia. The foot motion and ankle motion are intact bilaterally. Normal sensation throughout.  No results found for this or any previous visit (from the past 24 hour(s)). No results found.  Assessment and Plan: 79 y.o. female with  1) Foot pain: Is consistent with plantar fasciitis. She may have another injury. Plan to treat with postop  shoe.  Follow up with PCP.  2) Pink foot: Doubtful for erythremia. I think this is a stain of her skin from some of the topical creams she has been using. Her toenails are pink and the skin is non-tender and not warm. Plan to f/u with PCP.  Discussed warning signs or symptoms. Please see discharge instructions. Patient expresses understanding.     Gregor Hams, MD 08/14/14 1501

## 2014-08-14 NOTE — ED Notes (Signed)
C/o bilateral foot pain onset 4 days Sx include redness; hurts to walk  Denies inj/trauma Alert, no signs of acute distress.

## 2014-08-14 NOTE — Discharge Instructions (Signed)
Thank you for coming in today. Use the postoperative shoe. Follow-up with your primary care doctor. Continue Tylenol   Plantar Fasciitis Plantar fasciitis is a common condition that causes foot pain. It is soreness (inflammation) of the band of tough fibrous tissue on the bottom of the foot that runs from the heel bone (calcaneus) to the ball of the foot. The cause of this soreness may be from excessive standing, poor fitting shoes, running on hard surfaces, being overweight, having an abnormal walk, or overuse (this is common in runners) of the painful foot or feet. It is also common in aerobic exercise dancers and ballet dancers. SYMPTOMS  Most people with plantar fasciitis complain of:  Severe pain in the morning on the bottom of their foot especially when taking the first steps out of bed. This pain recedes after a few minutes of walking.  Severe pain is experienced also during walking following a long period of inactivity.  Pain is worse when walking barefoot or up stairs DIAGNOSIS   Your caregiver will diagnose this condition by examining and feeling your foot.  Special tests such as X-rays of your foot, are usually not needed. PREVENTION   Consult a sports medicine professional before beginning a new exercise program.  Walking programs offer a good workout. With walking there is a lower chance of overuse injuries common to runners. There is less impact and less jarring of the joints.  Begin all new exercise programs slowly. If problems or pain develop, decrease the amount of time or distance until you are at a comfortable level.  Wear good shoes and replace them regularly.  Stretch your foot and the heel cords at the back of the ankle (Achilles tendon) both before and after exercise.  Run or exercise on even surfaces that are not hard. For example, asphalt is better than pavement.  Do not run barefoot on hard surfaces.  If using a treadmill, vary the incline.  Do not  continue to workout if you have foot or joint problems. Seek professional help if they do not improve. HOME CARE INSTRUCTIONS   Avoid activities that cause you pain until you recover.  Use ice or cold packs on the problem or painful areas after working out.  Only take over-the-counter or prescription medicines for pain, discomfort, or fever as directed by your caregiver.  Soft shoe inserts or athletic shoes with air or gel sole cushions may be helpful.  If problems continue or become more severe, consult a sports medicine caregiver or your own health care provider. Cortisone is a potent anti-inflammatory medication that may be injected into the painful area. You can discuss this treatment with your caregiver. MAKE SURE YOU:   Understand these instructions.  Will watch your condition.  Will get help right away if you are not doing well or get worse. Document Released: 11/12/2000 Document Revised: 05/12/2011 Document Reviewed: 01/12/2008 [facility] Patient Information 2015 [place], [place]. This information is not intended to replace advice given to you by your health care provider. Make sure you discuss any questions you have with your health care provider.

## 2014-09-19 ENCOUNTER — Ambulatory Visit: Payer: Medicare Other | Admitting: Podiatry

## 2014-12-16 ENCOUNTER — Emergency Department (HOSPITAL_COMMUNITY): Payer: Medicare Other

## 2014-12-16 ENCOUNTER — Encounter (HOSPITAL_COMMUNITY): Payer: Self-pay | Admitting: Vascular Surgery

## 2014-12-16 ENCOUNTER — Inpatient Hospital Stay (HOSPITAL_COMMUNITY)
Admission: EM | Admit: 2014-12-16 | Discharge: 2014-12-21 | DRG: 641 | Disposition: A | Discharge status: Home Health | Payer: Medicare Other | Attending: Internal Medicine | Admitting: Internal Medicine

## 2014-12-16 DIAGNOSIS — E1140 Type 2 diabetes mellitus with diabetic neuropathy, unspecified: Secondary | ICD-10-CM

## 2014-12-16 DIAGNOSIS — R531 Weakness: Secondary | ICD-10-CM | POA: Diagnosis not present

## 2014-12-16 DIAGNOSIS — I10 Essential (primary) hypertension: Secondary | ICD-10-CM | POA: Diagnosis not present

## 2014-12-16 DIAGNOSIS — J45909 Unspecified asthma, uncomplicated: Secondary | ICD-10-CM | POA: Diagnosis present

## 2014-12-16 DIAGNOSIS — E1122 Type 2 diabetes mellitus with diabetic chronic kidney disease: Secondary | ICD-10-CM | POA: Diagnosis present

## 2014-12-16 DIAGNOSIS — Z79899 Other long term (current) drug therapy: Secondary | ICD-10-CM

## 2014-12-16 DIAGNOSIS — D649 Anemia, unspecified: Secondary | ICD-10-CM | POA: Diagnosis not present

## 2014-12-16 DIAGNOSIS — R51 Headache: Secondary | ICD-10-CM | POA: Diagnosis present

## 2014-12-16 DIAGNOSIS — E119 Type 2 diabetes mellitus without complications: Secondary | ICD-10-CM

## 2014-12-16 DIAGNOSIS — F17200 Nicotine dependence, unspecified, uncomplicated: Secondary | ICD-10-CM | POA: Diagnosis present

## 2014-12-16 DIAGNOSIS — I159 Secondary hypertension, unspecified: Secondary | ICD-10-CM | POA: Diagnosis not present

## 2014-12-16 DIAGNOSIS — K219 Gastro-esophageal reflux disease without esophagitis: Secondary | ICD-10-CM | POA: Diagnosis present

## 2014-12-16 DIAGNOSIS — R059 Cough, unspecified: Secondary | ICD-10-CM

## 2014-12-16 DIAGNOSIS — Z853 Personal history of malignant neoplasm of breast: Secondary | ICD-10-CM

## 2014-12-16 DIAGNOSIS — Z7951 Long term (current) use of inhaled steroids: Secondary | ICD-10-CM | POA: Diagnosis not present

## 2014-12-16 DIAGNOSIS — N183 Chronic kidney disease, stage 3 (moderate): Secondary | ICD-10-CM | POA: Diagnosis present

## 2014-12-16 DIAGNOSIS — N289 Disorder of kidney and ureter, unspecified: Secondary | ICD-10-CM

## 2014-12-16 DIAGNOSIS — R946 Abnormal results of thyroid function studies: Secondary | ICD-10-CM | POA: Diagnosis present

## 2014-12-16 DIAGNOSIS — N179 Acute kidney failure, unspecified: Secondary | ICD-10-CM | POA: Diagnosis not present

## 2014-12-16 DIAGNOSIS — R05 Cough: Secondary | ICD-10-CM

## 2014-12-16 DIAGNOSIS — E875 Hyperkalemia: Principal | ICD-10-CM | POA: Diagnosis present

## 2014-12-16 DIAGNOSIS — I129 Hypertensive chronic kidney disease with stage 1 through stage 4 chronic kidney disease, or unspecified chronic kidney disease: Secondary | ICD-10-CM | POA: Diagnosis present

## 2014-12-16 LAB — BASIC METABOLIC PANEL
ANION GAP: 10 (ref 5–15)
ANION GAP: 9 (ref 5–15)
BUN: 50 mg/dL — ABNORMAL HIGH (ref 6–20)
BUN: 49 mg/dL — AB (ref 6–20)
CHLORIDE: 115 mmol/L — AB (ref 101–111)
CO2: 17 mmol/L — ABNORMAL LOW (ref 22–32)
CO2: 20 mmol/L — ABNORMAL LOW (ref 22–32)
Calcium: 9.4 mg/dL (ref 8.9–10.3)
Calcium: 9.5 mg/dL (ref 8.9–10.3)
Chloride: 113 mmol/L — ABNORMAL HIGH (ref 101–111)
Creatinine, Ser: 2.67 mg/dL — ABNORMAL HIGH (ref 0.44–1.00)
Creatinine, Ser: 2.64 mg/dL — ABNORMAL HIGH (ref 0.44–1.00)
GFR calc Af Amer: 19 mL/min — ABNORMAL LOW (ref >60)
GFR, EST AFRICAN AMERICAN: 19 mL/min — AB (ref >60)
GFR, EST NON AFRICAN AMERICAN: 16 mL/min — AB (ref >60)
GFR, EST NON AFRICAN AMERICAN: 16 mL/min — AB (ref >60)
Glucose, Bld: 116 mg/dL — ABNORMAL HIGH (ref 65–99)
Glucose, Bld: 106 mg/dL — ABNORMAL HIGH (ref 65–99)
POTASSIUM: 6.6 mmol/L — AB (ref 3.5–5.1)
POTASSIUM: 6 mmol/L — AB (ref 3.5–5.1)
SODIUM: 142 mmol/L (ref 135–145)
SODIUM: 142 mmol/L (ref 135–145)

## 2014-12-16 LAB — URINALYSIS, ROUTINE W REFLEX MICROSCOPIC
Bilirubin Urine: NEGATIVE
Glucose, UA: NEGATIVE mg/dL
Hgb urine dipstick: NEGATIVE
KETONES UR: NEGATIVE mg/dL
LEUKOCYTES UA: NEGATIVE
NITRITE: NEGATIVE
PROTEIN: NEGATIVE mg/dL
Specific Gravity, Urine: 1.017 (ref 1.005–1.030)
Urobilinogen, UA: 0.2 mg/dL (ref 0.0–1.0)
pH: 5.5 (ref 5.0–8.0)

## 2014-12-16 LAB — CBC
HCT: 25.7 % — ABNORMAL LOW (ref 36.0–46.0)
Hemoglobin: 8.1 g/dL — ABNORMAL LOW (ref 12.0–15.0)
MCH: 29.1 pg (ref 26.0–34.0)
MCHC: 31.5 g/dL (ref 30.0–36.0)
MCV: 92.4 fL (ref 78.0–100.0)
PLATELETS: 198 10*3/uL (ref 150–400)
RBC: 2.78 MIL/uL — AB (ref 3.87–5.11)
RDW: 14.2 % (ref 11.5–15.5)
WBC: 5.9 10*3/uL (ref 4.0–10.5)

## 2014-12-16 LAB — POC OCCULT BLOOD, ED: FECAL OCCULT BLD: NEGATIVE

## 2014-12-16 LAB — CBG MONITORING, ED
GLUCOSE-CAPILLARY: 112 mg/dL — AB (ref 65–99)
GLUCOSE-CAPILLARY: 46 mg/dL — AB (ref 65–99)

## 2014-12-16 MED ORDER — INSULIN ASPART 100 UNIT/ML IV SOLN
10.0000 [IU] | Freq: Once | INTRAVENOUS | Status: AC
  Administered 2014-12-16: 10 [IU] via INTRAVENOUS
  Filled 2014-12-16: qty 1

## 2014-12-16 MED ORDER — ONDANSETRON 4 MG PO TBDP
4.0000 mg | ORAL_TABLET | Freq: Once | ORAL | Status: AC
  Administered 2014-12-16: 4 mg via ORAL
  Filled 2014-12-16: qty 1

## 2014-12-16 MED ORDER — CALCIUM GLUCONATE 10 % IV SOLN
1.0000 g | Freq: Once | INTRAVENOUS | Status: AC
  Administered 2014-12-16: 1 g via INTRAVENOUS
  Filled 2014-12-16: qty 10

## 2014-12-16 MED ORDER — OXYCODONE-ACETAMINOPHEN 5-325 MG PO TABS
1.0000 | ORAL_TABLET | Freq: Once | ORAL | Status: AC
  Administered 2014-12-16: 1 via ORAL
  Filled 2014-12-16: qty 1

## 2014-12-16 MED ORDER — DEXTROSE 50 % IV SOLN
1.0000 | Freq: Once | INTRAVENOUS | Status: AC
  Administered 2014-12-16: 50 mL via INTRAVENOUS
  Filled 2014-12-16: qty 50

## 2014-12-16 MED ORDER — SODIUM POLYSTYRENE SULFONATE 15 GM/60ML PO SUSP
30.0000 g | Freq: Once | ORAL | Status: AC
  Administered 2014-12-16: 30 g via ORAL
  Filled 2014-12-16: qty 120

## 2014-12-16 MED ORDER — TETANUS-DIPHTH-ACELL PERTUSSIS 5-2.5-18.5 LF-MCG/0.5 IM SUSP
0.5000 mL | Freq: Once | INTRAMUSCULAR | Status: AC
  Administered 2014-12-16: 0.5 mL via INTRAMUSCULAR
  Filled 2014-12-16: qty 0.5

## 2014-12-16 NOTE — ED Provider Notes (Signed)
CSN: [id number]     Arrival date & time 12/16/14  1655 History   First MD Initiated Contact with Patient 12/16/14 1708     Chief Complaint  Patient presents with  . Fall     (Consider location/radiation/quality/duration/timing/severity/associated sxs/prior Treatment) Patient is a 79 y.o. female presenting with fall and facial injury.  Fall Pertinent negatives include no headaches.  Facial Injury Mechanism of injury:  Fall Injury location: nasal bridge. Time since incident:  1 hour Pain details:    Quality:  Aching   Severity:  Moderate   Duration:  1 hour   Timing:  Constant   Progression:  Unchanged Chronicity:  New Relieved by:  Nothing Worsened by:  Nothing tried Ineffective treatments:  None tried Associated symptoms: no altered mental status, no epistaxis, no headaches, no loss of consciousness, no nausea and no vomiting     Past Medical History  Diagnosis Date  . Hypertension   . Asthma   . Shortness of breath   . Cancer ([place])     right breast  . GERD (gastroesophageal reflux disease)   . Arthritis   . Pneumonia   . Diabetes mellitus    Past Surgical History  Procedure Laterality Date  . Wrist surgery    . Breast surgery    . Abdominal hysterectomy    . Cholecystectomy    . Carpel tunnel     Family History  Problem Relation Age of Onset  . Diabetes Mellitus II Daughter   . Cancer Mother   . Dementia Mother    Social History  Substance Use Topics  . Smoking status: Current Every Day Smoker    Types: E-cigarettes  . Smokeless tobacco: Former Systems developer  . Alcohol Use: No   OB History    No data available     Review of Systems  HENT: Negative for nosebleeds.   Gastrointestinal: Negative for nausea and vomiting.  Neurological: Negative for loss of consciousness and headaches.  All other systems reviewed and are negative.     Allergies  Morphine and related and Vicodin  Home Medications   Prior to Admission medications   Medication Sig  Start Date End Date Taking? Authorizing Provider  ADVAIR DISKUS 250-50 MCG/DOSE AEPB Inhale 1 puff into the lungs 2 (two) times daily. 11/23/14  Yes Historical Provider, MD  amitriptyline (ELAVIL) 50 MG tablet Take 50 mg by mouth at bedtime.     Yes Historical Provider, MD  BYSTOLIC 10 MG tablet Take 10 mg by mouth daily. 12/07/14  Yes Historical Provider, MD  fluticasone (FLONASE) 50 MCG/ACT nasal spray Place 2 sprays into the nose daily.     Yes Historical Provider, MD  gabapentin (NEURONTIN) 100 MG capsule Take 100 mg by mouth 3 (three) times daily. 11/23/14  Yes Historical Provider, MD  loratadine (CLARITIN) 10 MG tablet Take 10 mg by mouth daily.     Yes Historical Provider, MD  losartan-hydrochlorothiazide (HYZAAR) 100-12.5 MG tablet Take 1 tablet by mouth daily. 11/27/14  Yes Historical Provider, MD  magnesium oxide (MAG-OX) 400 (241.3 MG) MG tablet Take 1 tablet by mouth daily. 11/23/14  Yes Historical Provider, MD  omeprazole (PRILOSEC) 20 MG capsule Take 40 mg by mouth daily.     Yes Historical Provider, MD  pioglitazone (ACTOS) 30 MG tablet Take 30 mg by mouth daily. 11/01/14  Yes Historical Provider, MD  potassium chloride (K-DUR) 10 MEQ tablet Take 10 mEq by mouth daily. 11/23/14  Yes Historical Provider, MD  traZODone (DESYREL) 50  MG tablet Take 50 mg by mouth at bedtime as needed for sleep.  11/23/14  Yes Historical Provider, MD  VENTOLIN HFA 108 (90 BASE) MCG/ACT inhaler Inhale 2 puffs into the lungs daily as needed for shortness of breath.  11/23/14  Yes Historical Provider, MD  traMADol (ULTRAM) 50 MG tablet Take 1 tablet (50 mg total) by mouth every 6 (six) hours as needed. 09/25/13   Gregor Hams, MD   BP 161/46 mmHg  Pulse 86  Temp(Src) 98.2 F (36.8 C) (Oral)  Resp 20  SpO2 100% Physical Exam  Constitutional: She is oriented to person, place, and time. She appears well-developed and well-nourished.  HENT:  Head: Normocephalic.  Right Ear: External ear normal.  Left Ear:  External ear normal.  Macerated tissue of nasal bridge, no laceration amenable to repair, also with abrasions of nasal tip and R inferior orbital  Eyes: Conjunctivae and EOM are normal. Pupils are equal, round, and reactive to light.  Neck: Normal range of motion. Neck supple.  Cardiovascular: Normal rate, regular rhythm, normal heart sounds and intact distal pulses.   Pulmonary/Chest: Effort normal and breath sounds normal.  Abdominal: Soft. Bowel sounds are normal. There is no tenderness.  Musculoskeletal: Normal range of motion.       Cervical back: Normal.       Thoracic back: Normal.       Right hand: She exhibits tenderness (over hypothenar eminance, no snuffbox tenderness).  Neurological: She is alert and oriented to person, place, and time.  Skin: Skin is warm and dry.  Vitals reviewed.   ED Course  Procedures (including critical care time) Labs Review Labs Reviewed  BASIC METABOLIC PANEL - Abnormal; Notable for the following:    Potassium 6.6 (*)    Chloride 115 (*)    CO2 17 (*)    Glucose, Bld 116 (*)    BUN 50 (*)    Creatinine, Ser 2.67 (*)    GFR calc non Af Amer 16 (*)    GFR calc Af Amer 19 (*)    All other components within normal limits  URINALYSIS, ROUTINE W REFLEX MICROSCOPIC (NOT AT [facility]) - Abnormal; Notable for the following:    APPearance CLOUDY (*)    All other components within normal limits  CBC - Abnormal; Notable for the following:    RBC 2.78 (*)    Hemoglobin 8.1 (*)    HCT 25.7 (*)    All other components within normal limits  BASIC METABOLIC PANEL - Abnormal; Notable for the following:    Potassium 6.0 (*)    Chloride 113 (*)    CO2 20 (*)    Glucose, Bld 106 (*)    BUN 49 (*)    Creatinine, Ser 2.64 (*)    GFR calc non Af Amer 16 (*)    GFR calc Af Amer 19 (*)    All other components within normal limits  CBG MONITORING, ED - Abnormal; Notable for the following:    Glucose-Capillary 112 (*)    All other components within normal limits   CBG MONITORING, ED - Abnormal; Notable for the following:    Glucose-Capillary 46 (*)    All other components within normal limits  MAGNESIUM  HEPATIC FUNCTION PANEL  POC OCCULT BLOOD, ED    Imaging Review Ct Head Wo Contrast  12/16/2014  CLINICAL DATA:  Status post fall. EXAM: CT HEAD WITHOUT CONTRAST TECHNIQUE: Contiguous axial images were obtained from the base of the skull through  the vertex without intravenous contrast. COMPARISON:  None. FINDINGS: There is no evidence of mass effect, midline shift, or extra-axial fluid collections. There is no evidence of a space-occupying lesion or intracranial hemorrhage. There is no evidence of a cortical-based area of acute infarction. There is generalized cerebral atrophy. There is periventricular white matter low attenuation likely secondary to microangiopathy. The ventricles and sulci are appropriate for the patient's age. The basal cisterns are patent. Visualized portions of the orbits are unremarkable. The visualized portions of the paranasal sinuses and mastoid air cells are unremarkable. The osseous structures are unremarkable. IMPRESSION: 1. No acute intracranial pathology. 2. Chronic microvascular disease and cerebral atrophy. Electronically Signed   By: Kathreen Devoid   On: 12/16/2014 19:06   Dg Hand Complete Right  12/16/2014  CLINICAL DATA:  Status post fall.  Right hand pain. EXAM: RIGHT HAND - COMPLETE 3+ VIEW COMPARISON:  None. FINDINGS: No acute fracture or dislocation. Generalized osteopenia. There is fragmentation of the right trapezoid which appears partially resected. There is mild osteoarthritis of the second CMC joint. There is mild osteoarthritis of the first and second MCP joints. There is ulnar minus variance. There is no soft tissue abnormality. IMPRESSION: 1. No acute osseous injury of the right hand. Electronically Signed   By: Kathreen Devoid   On: 12/16/2014 19:09   I have personally reviewed and evaluated these images and lab  results as part of my medical decision-making.   EKG Interpretation   Date/Time:  Saturday December 16 2014 17:03:51 EDT Ventricular Rate:  73 PR Interval:  164 QRS Duration: 72 QT Interval:  374 QTC Calculation: 412 R Axis:   -25 Text Interpretation:  Normal sinus rhythm Normal ECG No significant change  since last tracing Confirmed by Debby Freiberg [phone]) on 12/16/2014  5:08:55 PM     CRITICAL CARE Performed by: Debby Freiberg   Total critical care time: 72 min  Critical care time was exclusive of separately billable procedures and treating other patients.  Critical care was necessary to treat or prevent imminent or life-threatening deterioration.  Critical care was time spent personally by me on the following activities: development of treatment plan with patient and/or surrogate as well as nursing, discussions with consultants, evaluation of patient's response to treatment, examination of patient, obtaining history from patient or surrogate, ordering and performing treatments and interventions, ordering and review of laboratory studies, ordering and review of radiographic studies, pulse oximetry and re-evaluation of patient's condition.  MDM   Final diagnoses:  None    79 y.o. female with pertinent PMH of HTN, GERD, DM presents with likely mechanical fall from standing with hit to face and R hand.  Physical exam with abrasions and trauma as above.    Elba Barman revealed hyperkalemia and AKI.  Given kayexalate, calcium, insulin, d50.  Admitted in stable condition  I have reviewed all laboratory and imaging studies if ordered as above  No diagnosis found.      Debby Freiberg, MD 12/17/14 [phone]

## 2014-12-16 NOTE — ED Notes (Addendum)
Pt reports to the ED for eval of pain following a fall. She reports she was walking up onto her porch and she is unsure if she tripped or if she had a syncopal episode. Pt struck her face and her right hand on the concrete. Abrasions noted to nose and bleeding controlled. She reports she has not been feeling well and is currently on abx for a URI. She is not on blood thinners. Pt A&Ox4, resp e/u, and skin warm and dry.

## 2014-12-16 NOTE — H&P (Signed)
[facility] History and Physical  CAMEO SCHMIESING GMW:[id number] DOB: 16-Jul-1935 DOA: 12/16/2014  Referring physician: ED physician PCP: Philis Fendt, MD   Chief Complaint: weakness and fall  HPI:  Ms. Martorelli is a 79yo woman with PMH of HTN, Asthma, DM, GERD, arthritis, h/o Rt sided breast cancer who presents after a fall. Ms. Mcdonald reports that she was walking up 2 steps to the porch.  She doesn't remember exactly what happened, but the next thing she knew she had fallen down.  She was lightheaded prior to the event.  She landed on her nose and wrist.  She has a scrape on her nose.  Ms. Wonders reports that she has not been feeling well for about a month now.  She went to see her PCP on 9/22 for a cough and SOB and was diagnosed with a sinus infection and treated with Abx for about 5 days.  However, since that time, she has had progressive SOB, cough, productive of occasional white phlegm, decreased appetite and decreased PO intake, cramping in her stomach.  She also notes a daily headache since that time, right frontal, sharp, comes and goes.  She has been taking one tylenol tablet about every four hours for this headache and it occasionally helps, but not always.  The pain is always in the same place.  She has no associated blurry vision, double vision, pain in her eyes, photophobia.  She has chronic cramping in her hands and legs, chronic feeling of being cold. For the cramping, her PCP has placed her on KCl and mag oxide which she has continued to take.  Further symptoms include dry mouth and swelling in the ankles, which are chronic.   She reports no change in urinary habits, no melena or dark stools, no obvious loss of blood from any source.   In the ED, she was found to have an AKI (unknown baseline at this time), K of 6.6.  This was treated with insulin, D50, calcium gluconate and kayexalate and came down to 6.0.  EKG showed no peaking of the twaves.  She had a CT of her head in the ED which  did not show any acute findings.  She also had a wrist xray which showed no acute injury.   Assessment and Plan:  Hyperkalemia - Treated with insulin, D50, Calcium and Kayexalate - K down to 6.0, ULN is 5.1 - Called from nursing that BS in the 40s, so holding off on any further insulin at this time - Monitor on telemetry - Check BMET at 1am - repeat EKG in the AM - Hold oral KCl  AKI on ? CKD - Unclear baseline - IVF with NS at 100cc/hr - Recheck BMET and trend - If here during business hours, attempt to get further records - Hold renally dosed medications including losartan, hctz - Check TSH - Consider work up for CKD if not improved (PTH, SPEP, UPEP, etc.)    Diabetes type 2, controlled ([place]) - Reports last A1C under 6.0 - Hold pioglitazone at this time - SSI renal dosing - Check A1C    HTN (hypertension) - BP moderately elevated  - continue home bystolic - Hold losartan and hctz due to renal dysfunction - PRN hydralazine PO for systolic SBP > 440  Anemia, normocytic - Patient reports  Feeling cold all the time, but no blood loss - FOBT ordered - Checking anemia panel - Possibly related to renal insufficiency - No indication for blood transfusion at this time.  Headache - CT head is okay, no mass or acute reason for headache - Check ESR given site of headache - Tramadol for pain, allergic to morphine and related and cannot take NSAIDs in setting of renal failure.  - Check LFTs as she has been taking tylenol frequently  Chronic cough, SOB, sputum production - CXR today - Duonebs prn - She was doing relatively well when I saw her, no acute coughing except when breathing during lung exam - Treatment based on findings on CXR - Consider PFTs - Continue PPI and loratadine for chronic cough  GERD - PPI with protonix while in hospital.   Diet: Heart Healthy  DVT PPx: Lovenox   Radiological Exams on Admission: Ct Head Wo Contrast  12/16/2014  CLINICAL DATA:   Status post fall. EXAM: CT HEAD WITHOUT CONTRAST TECHNIQUE: Contiguous axial images were obtained from the base of the skull through the vertex without intravenous contrast. COMPARISON:  None. FINDINGS: There is no evidence of mass effect, midline shift, or extra-axial fluid collections. There is no evidence of a space-occupying lesion or intracranial hemorrhage. There is no evidence of a cortical-based area of acute infarction. There is generalized cerebral atrophy. There is periventricular white matter low attenuation likely secondary to microangiopathy. The ventricles and sulci are appropriate for the patient's age. The basal cisterns are patent. Visualized portions of the orbits are unremarkable. The visualized portions of the paranasal sinuses and mastoid air cells are unremarkable. The osseous structures are unremarkable. IMPRESSION: 1. No acute intracranial pathology. 2. Chronic microvascular disease and cerebral atrophy. Electronically Signed   By: Kathreen Devoid   On: 12/16/2014 19:06   Dg Hand Complete Right  12/16/2014  CLINICAL DATA:  Status post fall.  Right hand pain. EXAM: RIGHT HAND - COMPLETE 3+ VIEW COMPARISON:  None. FINDINGS: No acute fracture or dislocation. Generalized osteopenia. There is fragmentation of the right trapezoid which appears partially resected. There is mild osteoarthritis of the second CMC joint. There is mild osteoarthritis of the first and second MCP joints. There is ulnar minus variance. There is no soft tissue abnormality. IMPRESSION: 1. No acute osseous injury of the right hand. Electronically Signed   By: Kathreen Devoid   On: 12/16/2014 19:09   Code Status: Full Family Communication: Pt and 2 daughters at bedside.   Disposition Plan: Admit for further evaluation    Gilles Chiquito, MD [phone]   Review of Systems:  Constitutional: + for chills, fatigue. Negative for fever, diaphoresis.  HENT: + for congestion Negative for hearing loss, ear pain, nosebleeds, sore  throat Eyes: Negative for blurred vision, double vision, photophobia Respiratory: + for cough, sputum production, SOB, wheezing.  Negative for hemoptysis and stridor.   Cardiovascular: + for small amount of ankle swelling. Negative for chest pain, palpitations, orthopnea, claudication Gastrointestinal: Negative for nausea, vomiting and abdominal pain. Negative for heartburn, constipation, blood in stool and melena.  Genitourinary: Negative for dysuria, urgency, frequency, hematuria and flank pain.  Musculoskeletal: + for fall, chronic joint pain from arthritis Negative for myalgias, back pain.  Skin: Negative for itching and rash.  Neurological: + for lightheadedness, headache Negative for dizziness and weakness. Negative for tingling, tremors, sensory change, speech change, focal weakness, loss of consciousness and .  Endo/Heme/Allergies:  Does not bruise/bleed easily.  Psychiatric/Behavioral:  The patient is not nervous/anxious.      Past Medical History  Diagnosis Date  . Hypertension   . Asthma   . Shortness of breath   . Cancer ([place])  right breast  . GERD (gastroesophageal reflux disease)   . Arthritis   . Pneumonia   . Diabetes mellitus     Past Surgical History  Procedure Laterality Date  . Wrist surgery    . Breast surgery    . Abdominal hysterectomy    . Cholecystectomy    . Carpel tunnel      Social History:  reports that she has been smoking E-cigarettes.  She has quit using smokeless tobacco. She reports that she does not drink alcohol or use illicit drugs.  Allergies  Allergen Reactions  . Morphine And Related Itching and Other (See Comments)    Crawling sensations  . Vicodin [Hydrocodone-Acetaminophen] Itching and Other (See Comments)    Crawling sensations    Family History  Problem Relation Age of Onset  . Diabetes Mellitus II Daughter   . Cancer Mother   . Dementia Mother     Prior to Admission medications   Medication Sig Start Date End Date  Taking? Authorizing Provider  ADVAIR DISKUS 250-50 MCG/DOSE AEPB Inhale 1 puff into the lungs 2 (two) times daily. 11/23/14  Yes Historical Provider, MD  amitriptyline (ELAVIL) 50 MG tablet Take 50 mg by mouth at bedtime.     Yes Historical Provider, MD  BYSTOLIC 10 MG tablet Take 10 mg by mouth daily. 12/07/14  Yes Historical Provider, MD  fluticasone (FLONASE) 50 MCG/ACT nasal spray Place 2 sprays into the nose daily.     Yes Historical Provider, MD  gabapentin (NEURONTIN) 100 MG capsule Take 100 mg by mouth 3 (three) times daily. 11/23/14  Yes Historical Provider, MD  loratadine (CLARITIN) 10 MG tablet Take 10 mg by mouth daily.     Yes Historical Provider, MD  losartan-hydrochlorothiazide (HYZAAR) 100-12.5 MG tablet Take 1 tablet by mouth daily. 11/27/14  Yes Historical Provider, MD  magnesium oxide (MAG-OX) 400 (241.3 MG) MG tablet Take 1 tablet by mouth daily. 11/23/14  Yes Historical Provider, MD  omeprazole (PRILOSEC) 20 MG capsule Take 40 mg by mouth daily.     Yes Historical Provider, MD  pioglitazone (ACTOS) 30 MG tablet Take 30 mg by mouth daily. 11/01/14  Yes Historical Provider, MD  potassium chloride (K-DUR) 10 MEQ tablet Take 10 mEq by mouth daily. 11/23/14  Yes Historical Provider, MD  traZODone (DESYREL) 50 MG tablet Take 50 mg by mouth at bedtime as needed for sleep.  11/23/14  Yes Historical Provider, MD  VENTOLIN HFA 108 (90 BASE) MCG/ACT inhaler Inhale 2 puffs into the lungs daily as needed for shortness of breath.  11/23/14  Yes Historical Provider, MD  traMADol (ULTRAM) 50 MG tablet Take 1 tablet (50 mg total) by mouth every 6 (six) hours as needed. 09/25/13   Gregor Hams, MD    Physical Exam: Filed Vitals:   12/16/14 2030 12/16/14 2130 12/16/14 2233 12/16/14 2237  BP: 124/42 121/51 163/51 161/46  Pulse: 84 85 78 86  Temp:      TempSrc:      Resp: _0 SpO2: 92% 95% 98% 100%    Physical Exam  Constitutional: Elderly woman, NAD. HENT: Normocephalic.  Oropharynx is  clear and moist. She has a scrape to bridge of her nose Eyes: Conjunctivae normal. PERRLA, no scleral icterus.  Neck: Normal ROM. Neck supple.  CVS: RR, NR, S1/S2 +, no murmurs Pulmonary: Effort normal, she has diminished breath sounds throughout, worse on the left, + expiratory wheezing rarely, no rales  Abdominal: Soft. BS +,  no  distension, tenderness Musculoskeletal: 1+ edema to ankles, warm and well perfused LE Lymphadenopathy: No lymphadenopathy noted, cervical. Neuro: Alert. Normal muscle tone. No cranial nerve deficit. Skin: Skin is warm and dry. No rash noted Psychiatric: Normal mood and affect. Behavior, judgment, thought content normal.   Labs on Admission:  Basic Metabolic Panel:  Recent Labs Lab 12/16/14 1728 12/16/14 2052  NA 142 142  K 6.6* 6.0*  CL 115* 113*  CO2 17* 20*  GLUCOSE 116* 106*  BUN 50* 49*  CREATININE 2.67* 2.64*  CALCIUM 9.4 9.5   CBC:  Recent Labs Lab 12/16/14 1924  WBC 5.9  HGB 8.1*  HCT 25.7*  MCV 92.4  PLT 198   CBG:  Recent Labs Lab 12/16/14 1725 12/16/14 2355  GLUCAP 112* 46*    EKG: Normal sinus rhythm, no ST/T wave changes, no peaked TW   If 7PM-7AM, please contact night-coverage [email or web address] Password [email or web address] 12/17/2014, 12:12 AM

## 2014-12-17 ENCOUNTER — Encounter (HOSPITAL_COMMUNITY): Payer: Self-pay | Admitting: Internal Medicine

## 2014-12-17 ENCOUNTER — Inpatient Hospital Stay (HOSPITAL_COMMUNITY): Payer: Medicare Other

## 2014-12-17 DIAGNOSIS — I159 Secondary hypertension, unspecified: Secondary | ICD-10-CM

## 2014-12-17 LAB — CBC
HEMATOCRIT: 25.5 % — AB (ref 36.0–46.0)
HEMOGLOBIN: 8 g/dL — AB (ref 12.0–15.0)
MCH: 29.2 pg (ref 26.0–34.0)
MCHC: 31.4 g/dL (ref 30.0–36.0)
MCV: 93.1 fL (ref 78.0–100.0)
Platelets: 193 10*3/uL (ref 150–400)
RBC: 2.74 MIL/uL — AB (ref 3.87–5.11)
RDW: 14.3 % (ref 11.5–15.5)
WBC: 8 10*3/uL (ref 4.0–10.5)

## 2014-12-17 LAB — MAGNESIUM: MAGNESIUM: 2 mg/dL (ref 1.7–2.4)

## 2014-12-17 LAB — HEPATIC FUNCTION PANEL
ALBUMIN: 3.6 g/dL (ref 3.5–5.0)
ALT: 13 U/L — ABNORMAL LOW (ref 14–54)
AST: 22 U/L (ref 15–41)
Alkaline Phosphatase: 57 U/L (ref 38–126)
Bilirubin, Direct: <0.1 mg/dL — ABNORMAL LOW (ref 0.1–0.5)
TOTAL PROTEIN: 6.8 g/dL (ref 6.5–8.1)
Total Bilirubin: 0.2 mg/dL — ABNORMAL LOW (ref 0.3–1.2)

## 2014-12-17 LAB — FERRITIN: FERRITIN: 173 ng/mL (ref 11–307)

## 2014-12-17 LAB — BASIC METABOLIC PANEL
ANION GAP: 13 (ref 5–15)
BUN: 47 mg/dL — ABNORMAL HIGH (ref 6–20)
CALCIUM: 9.5 mg/dL (ref 8.9–10.3)
CHLORIDE: 113 mmol/L — AB (ref 101–111)
CO2: 16 mmol/L — AB (ref 22–32)
Creatinine, Ser: 2.48 mg/dL — ABNORMAL HIGH (ref 0.44–1.00)
GFR calc non Af Amer: 18 mL/min — ABNORMAL LOW (ref >60)
GFR, EST AFRICAN AMERICAN: 20 mL/min — AB (ref >60)
Glucose, Bld: 121 mg/dL — ABNORMAL HIGH (ref 65–99)
Potassium: 5.6 mmol/L — ABNORMAL HIGH (ref 3.5–5.1)
Sodium: 142 mmol/L (ref 135–145)

## 2014-12-17 LAB — GLUCOSE, CAPILLARY
GLUCOSE-CAPILLARY: 115 mg/dL — AB (ref 65–99)
GLUCOSE-CAPILLARY: 96 mg/dL (ref 65–99)
Glucose-Capillary: 101 mg/dL — ABNORMAL HIGH (ref 65–99)
Glucose-Capillary: 89 mg/dL (ref 65–99)

## 2014-12-17 LAB — CBG MONITORING, ED: GLUCOSE-CAPILLARY: 173 mg/dL — AB (ref 65–99)

## 2014-12-17 LAB — IRON AND TIBC
Iron: 65 ug/dL (ref 28–170)
SATURATION RATIOS: 25 % (ref 10.4–31.8)
TIBC: 260 ug/dL (ref 250–450)
UIBC: 195 ug/dL

## 2014-12-17 LAB — OCCULT BLOOD X 1 CARD TO LAB, STOOL: Fecal Occult Bld: NEGATIVE

## 2014-12-17 LAB — SEDIMENTATION RATE: SED RATE: 45 mm/h — AB (ref 0–22)

## 2014-12-17 LAB — TSH
TSH: 0.019 u[IU]/mL — ABNORMAL LOW (ref 0.350–4.500)
TSH: 0.096 u[IU]/mL — AB (ref 0.350–4.500)

## 2014-12-17 MED ORDER — DEXTROSE 50 % IV SOLN: 1.0000 | Freq: Once | INTRAVENOUS | Status: DC

## 2014-12-17 MED ORDER — SODIUM CHLORIDE 0.9 % IV SOLN
INTRAVENOUS | Status: AC
  Administered 2014-12-17: 02:00:00 via INTRAVENOUS

## 2014-12-17 MED ORDER — DOCUSATE SODIUM 100 MG PO CAPS
100.0000 mg | ORAL_CAPSULE | Freq: Two times a day (BID) | ORAL | Status: DC
  Administered 2014-12-17 – 2014-12-21 (×8): 100 mg via ORAL
  Filled 2014-12-17 (×9): qty 1

## 2014-12-17 MED ORDER — INSULIN ASPART 100 UNIT/ML ~~LOC~~ SOLN: 0.0000 [IU] | Freq: Three times a day (TID) | SUBCUTANEOUS | Status: DC

## 2014-12-17 MED ORDER — NEBIVOLOL HCL 10 MG PO TABS
10.0000 mg | ORAL_TABLET | Freq: Every day | ORAL | Status: DC
  Administered 2014-12-17 – 2014-12-21 (×5): 10 mg via ORAL
  Filled 2014-12-17 (×6): qty 1

## 2014-12-17 MED ORDER — IPRATROPIUM-ALBUTEROL 0.5-2.5 (3) MG/3ML IN SOLN
3.0000 mL | Freq: Four times a day (QID) | RESPIRATORY_TRACT | Status: DC
  Administered 2014-12-17 – 2014-12-20 (×12): 3 mL via RESPIRATORY_TRACT
  Filled 2014-12-17 (×13): qty 3

## 2014-12-17 MED ORDER — DEXTROSE 50 % IV SOLN
1.0000 | Freq: Once | INTRAVENOUS | Status: AC
  Administered 2014-12-17: 50 mL via INTRAVENOUS
  Filled 2014-12-17: qty 50

## 2014-12-17 MED ORDER — SENNOSIDES-DOCUSATE SODIUM 8.6-50 MG PO TABS: 1.0000 | ORAL_TABLET | Freq: Every evening | ORAL | Status: DC | PRN

## 2014-12-17 MED ORDER — TRAZODONE HCL 50 MG PO TABS: 50.0000 mg | ORAL_TABLET | Freq: Every evening | ORAL | Status: DC | PRN

## 2014-12-17 MED ORDER — MOMETASONE FURO-FORMOTEROL FUM 100-5 MCG/ACT IN AERO
2.0000 | INHALATION_SPRAY | Freq: Two times a day (BID) | RESPIRATORY_TRACT | Status: DC
  Administered 2014-12-17 – 2014-12-21 (×9): 2 via RESPIRATORY_TRACT
  Filled 2014-12-17: qty 8.8

## 2014-12-17 MED ORDER — ONDANSETRON HCL 4 MG/2ML IJ SOLN: 4.0000 mg | Freq: Four times a day (QID) | INTRAMUSCULAR | Status: DC | PRN

## 2014-12-17 MED ORDER — ONDANSETRON HCL 4 MG PO TABS
4.0000 mg | ORAL_TABLET | Freq: Four times a day (QID) | ORAL | Status: DC | PRN
  Administered 2014-12-19: 4 mg via ORAL
  Filled 2014-12-17: qty 1

## 2014-12-17 MED ORDER — SODIUM POLYSTYRENE SULFONATE 15 GM/60ML PO SUSP
15.0000 g | Freq: Once | ORAL | Status: AC
  Administered 2014-12-17: 15 g via ORAL
  Filled 2014-12-17: qty 60

## 2014-12-17 MED ORDER — LORATADINE 10 MG PO TABS
10.0000 mg | ORAL_TABLET | Freq: Every day | ORAL | Status: DC
  Administered 2014-12-17 – 2014-12-21 (×5): 10 mg via ORAL
  Filled 2014-12-17 (×5): qty 1

## 2014-12-17 MED ORDER — TRAMADOL HCL 50 MG PO TABS
25.0000 mg | ORAL_TABLET | Freq: Four times a day (QID) | ORAL | Status: DC | PRN
  Administered 2014-12-17 – 2014-12-19 (×5): 25 mg via ORAL
  Filled 2014-12-17 (×5): qty 1

## 2014-12-17 MED ORDER — HYDRALAZINE HCL 25 MG PO TABS: 25.0000 mg | ORAL_TABLET | Freq: Three times a day (TID) | ORAL | Status: DC | PRN

## 2014-12-17 MED ORDER — ENOXAPARIN SODIUM 30 MG/0.3ML ~~LOC~~ SOLN
30.0000 mg | Freq: Every day | SUBCUTANEOUS | Status: DC
  Administered 2014-12-17 – 2014-12-21 (×5): 30 mg via SUBCUTANEOUS
  Filled 2014-12-17 (×5): qty 0.3

## 2014-12-17 MED ORDER — ALBUTEROL SULFATE (2.5 MG/3ML) 0.083% IN NEBU: 2.5000 mg | INHALATION_SOLUTION | Freq: Every day | RESPIRATORY_TRACT | Status: DC | PRN

## 2014-12-17 MED ORDER — INSULIN ASPART 100 UNIT/ML ~~LOC~~ SOLN: 0.0000 [IU] | Freq: Every day | SUBCUTANEOUS | Status: DC

## 2014-12-17 MED ORDER — FLUTICASONE PROPIONATE 50 MCG/ACT NA SUSP
2.0000 | Freq: Every day | NASAL | Status: DC
  Administered 2014-12-17 – 2014-12-21 (×3): 2 via NASAL
  Filled 2014-12-17: qty 16

## 2014-12-17 MED ORDER — SODIUM CHLORIDE 0.9 % IJ SOLN
3.0000 mL | Freq: Two times a day (BID) | INTRAMUSCULAR | Status: DC
  Administered 2014-12-17 – 2014-12-20 (×3): 3 mL via INTRAVENOUS

## 2014-12-17 MED ORDER — SODIUM CHLORIDE 0.9 % IV SOLN
INTRAVENOUS | Status: DC
  Administered 2014-12-17 – 2014-12-18 (×3): via INTRAVENOUS
  Administered 2014-12-19: 1000 mL via INTRAVENOUS
  Administered 2014-12-20: 02:00:00 via INTRAVENOUS

## 2014-12-17 MED ORDER — PANTOPRAZOLE SODIUM 40 MG PO TBEC
80.0000 mg | DELAYED_RELEASE_TABLET | Freq: Every day | ORAL | Status: DC
  Administered 2014-12-17 – 2014-12-21 (×5): 80 mg via ORAL
  Filled 2014-12-17 (×6): qty 2

## 2014-12-17 NOTE — Progress Notes (Signed)
Patient arrived on unit via stretcher with RN. Patient alert and oriented x4. Patient oriented to room, unit and staff. Patient's two daughters at bedside. Patient placed on telemetry monitor, CCMD notified. Skin assessment completed with two RNs, check flow sheets. Patient rates pain 8/10, medication administered. Patient's IV clean, dry and intact. Safety Fall Prevention Plan was given, discussed and signed by patient. Orders have been reviewed and implemented. Call light has been placed within reach and bed alarm has been activated. RN will continue to monitor the patient.  Nena Polio BSN, RN  Phone Number: [phone]

## 2014-12-17 NOTE — ED Notes (Signed)
Report attempted 

## 2014-12-17 NOTE — ED Notes (Signed)
This RN was alerted that the pt was feeling weak and diaphoretic. This RN went into the room to find the pt extremely diaphoretic. Pt is a diabetic.

## 2014-12-17 NOTE — Progress Notes (Signed)
[facility] PROGRESS NOTE  Stacey Zamora JHE:[id number] DOB: [birth year] DOA: 12/16/2014 PCP: Philis Fendt, MD  Assessment/Plan: Active Problems:   Renal insufficiency - elevated bun creatinine ratio - Administer IVF's and reassess next am.    Diabetes type 2, controlled (HCC) - Pt is on SSI    HTN (hypertension) -Pt currently on nebivolol    Hyperkalemia - Place on IVF's and administer kayexalate    Anemia - stable, no active bleeding  Code Status: full Family Communication: None at bedside Disposition Plan: pending improvement in condition.   Consultants:  None  Procedures:  None  Antibiotics:  None  HPI/Subjective: Pt has no new complaints. No acute issues overnight.  Objective: Filed Vitals:   12/17/14 0747  BP: 151/67  Pulse: 92  Temp: 98.6 F (37 C)  Resp: 18    Intake/Output Summary (Last 24 hours) at 12/17/14 1444 Last data filed at 12/17/14 1344  Gross per 24 hour  Intake 1378.67 ml  Output      0 ml  Net 1378.67 ml   Filed Weights   12/17/14 0051  Weight: 94.076 kg (207 lb 6.4 oz)    Exam:   General:  Pt in nad, alert and awake  Cardiovascular: rrr, no mrg  Respiratory: cta bl, no wheezes  Abdomen: soft, ND, NT  Musculoskeletal: no cyanosis or clubbing   Data Reviewed: Basic Metabolic Panel:  Recent Labs Lab 12/16/14 1728 12/16/14 2052 12/17/14 0146  NA 142 142 142  K 6.6* 6.0* 5.6*  CL 115* 113* 113*  CO2 17* 20* 16*  GLUCOSE 116* 106* 121*  BUN 50* 49* 47*  CREATININE 2.67* 2.64* 2.48*  CALCIUM 9.4 9.5 9.5  MG  --  2.0  --    Liver Function Tests:  Recent Labs Lab 12/16/14 2052  AST 22  ALT 13*  ALKPHOS 57  BILITOT 0.2*  PROT 6.8  ALBUMIN 3.6   No results for input(s): LIPASE, AMYLASE in the last 168 hours. No results for input(s): AMMONIA in the last 168 hours. CBC:  Recent Labs Lab 12/16/14 1924 12/17/14 0146  WBC 5.9 8.0  HGB 8.1* 8.0*  HCT 25.7* 25.5*  MCV 92.4 93.1  PLT 198  193   Cardiac Enzymes: No results for input(s): CKTOTAL, CKMB, CKMBINDEX, TROPONINI in the last 168 hours. BNP (last 3 results) No results for input(s): BNP in the last 8760 hours.  ProBNP (last 3 results) No results for input(s): PROBNP in the last 8760 hours.  CBG:  Recent Labs Lab 12/16/14 1725 12/16/14 2355 12/17/14 0021 12/17/14 0743 12/17/14 1132  GLUCAP 112* 46* 173* 115* 96    No results found for this or any previous visit (from the past 240 hour(s)).   Studies: X-ray Chest Pa And Lateral  12/17/2014  CLINICAL DATA:  Productive cough, SOB, and decreased oral intake x 3 weeks. EXAM: CHEST  2 VIEW COMPARISON:  01/28/2011 FINDINGS: Lateral view degraded by patient arm position. Right axillary node dissection. Midline trachea. Mild cardiomegaly with transverse aortic atherosclerosis. No pleural effusion or pneumothorax. Lower lobe predominant interstitial thickening. No congestive failure. No lobar consolidation. Surgical clips which project over the right axilla and right breast. IMPRESSION: No acute cardiopulmonary disease. Chronic pulmonary interstitial thickening. Cardiomegaly with aortic atherosclerosis. Electronically Signed   By: Abigail Miyamoto M.D.   On: 12/17/2014 09:02   Ct Head Wo Contrast  12/16/2014  CLINICAL DATA:  Status post fall. EXAM: CT HEAD WITHOUT CONTRAST TECHNIQUE: Contiguous axial images were obtained from the  base of the skull through the vertex without intravenous contrast. COMPARISON:  None. FINDINGS: There is no evidence of mass effect, midline shift, or extra-axial fluid collections. There is no evidence of a space-occupying lesion or intracranial hemorrhage. There is no evidence of a cortical-based area of acute infarction. There is generalized cerebral atrophy. There is periventricular white matter low attenuation likely secondary to microangiopathy. The ventricles and sulci are appropriate for the patient's age. The basal cisterns are patent.  Visualized portions of the orbits are unremarkable. The visualized portions of the paranasal sinuses and mastoid air cells are unremarkable. The osseous structures are unremarkable. IMPRESSION: 1. No acute intracranial pathology. 2. Chronic microvascular disease and cerebral atrophy. Electronically Signed   By: Kathreen Devoid   On: 12/16/2014 19:06   Dg Hand Complete Right  12/16/2014  CLINICAL DATA:  Status post fall.  Right hand pain. EXAM: RIGHT HAND - COMPLETE 3+ VIEW COMPARISON:  None. FINDINGS: No acute fracture or dislocation. Generalized osteopenia. There is fragmentation of the right trapezoid which appears partially resected. There is mild osteoarthritis of the second CMC joint. There is mild osteoarthritis of the first and second MCP joints. There is ulnar minus variance. There is no soft tissue abnormality. IMPRESSION: 1. No acute osseous injury of the right hand. Electronically Signed   By: Kathreen Devoid   On: 12/16/2014 19:09    Scheduled Meds: . dextrose  1 ampule Intravenous Once  . docusate sodium  100 mg Oral BID  . enoxaparin (LOVENOX) injection  30 mg Subcutaneous Daily  . fluticasone  2 spray Each Nare Daily  . insulin aspart  0-5 Units Subcutaneous QHS  . insulin aspart  0-9 Units Subcutaneous TID WC  . ipratropium-albuterol  3 mL Nebulization Q6H  . loratadine  10 mg Oral Daily  . mometasone-formoterol  2 puff Inhalation BID  . nebivolol  10 mg Oral Daily  . pantoprazole  80 mg Oral Daily  . sodium chloride  3 mL Intravenous Q12H   Continuous Infusions:    Time spent: > 35 minutes   Velvet Bathe  [facility] Pager [phone]. If 7PM-7AM, please contact night-coverage at [email or web address], password [facility] 12/17/2014, 2:44 PM  LOS: 1 day

## 2014-12-17 NOTE — Progress Notes (Signed)
Attempted to place PIV with 3 attempts by myself and Colin Ina RN.  Pt refuses further attempts at this time.  Able to access veins but unable to thread catheter in the vein.   Veins are easily visible but  very fragile and multiple valves present.

## 2014-12-17 NOTE — ED Notes (Signed)
Pt given orange juice, graham crackers and peanut butter.  

## 2014-12-17 NOTE — Progress Notes (Signed)
Utilization Review Completed.Stacey Zamora T10/16/2016  

## 2014-12-18 LAB — HEMOGLOBIN A1C
Hgb A1c MFr Bld: 6.1 % — ABNORMAL HIGH (ref 4.8–5.6)
Mean Plasma Glucose: 128 mg/dL

## 2014-12-18 LAB — BASIC METABOLIC PANEL
ANION GAP: 10 (ref 5–15)
BUN: 34 mg/dL — ABNORMAL HIGH (ref 6–20)
CHLORIDE: 115 mmol/L — AB (ref 101–111)
CO2: 17 mmol/L — ABNORMAL LOW (ref 22–32)
CREATININE: 2.05 mg/dL — AB (ref 0.44–1.00)
Calcium: 8.8 mg/dL — ABNORMAL LOW (ref 8.9–10.3)
GFR calc Af Amer: 26 mL/min — ABNORMAL LOW (ref >60)
GFR calc non Af Amer: 22 mL/min — ABNORMAL LOW (ref >60)
Glucose, Bld: 97 mg/dL (ref 65–99)
POTASSIUM: 5 mmol/L (ref 3.5–5.1)
SODIUM: 142 mmol/L (ref 135–145)

## 2014-12-18 LAB — GLUCOSE, CAPILLARY
GLUCOSE-CAPILLARY: 107 mg/dL — AB (ref 65–99)
GLUCOSE-CAPILLARY: 80 mg/dL (ref 65–99)
GLUCOSE-CAPILLARY: 116 mg/dL — AB (ref 65–99)
GLUCOSE-CAPILLARY: 85 mg/dL (ref 65–99)
GLUCOSE-CAPILLARY: 111 mg/dL — AB (ref 65–99)

## 2014-12-18 MED ORDER — GLUCERNA SHAKE PO LIQD
237.0000 mL | Freq: Three times a day (TID) | ORAL | Status: DC
  Administered 2014-12-18 – 2014-12-21 (×8): 237 mL via ORAL

## 2014-12-18 MED ORDER — BENZONATATE 100 MG PO CAPS: 100.0000 mg | ORAL_CAPSULE | Freq: Two times a day (BID) | ORAL | Status: DC | PRN

## 2014-12-18 NOTE — Progress Notes (Signed)
[facility] PROGRESS NOTE  SILVA AAMODT NWG:[id number] DOB: 1935/08/16 DOA: 12/16/2014 PCP: Philis Fendt, MD  Assessment/Plan: Active Problems:   Renal insufficiency - elevated bun creatinine ratio - Administer IVF's and reassess next am.    Diabetes type 2, controlled (HCC) - Pt is on SSI    HTN (hypertension) -Pt currently on nebivolol    Hyperkalemia - resolved    Anemia - stable, no active bleeding  Code Status: full Family Communication: None at bedside Disposition Plan: pending improvement in condition.   Consultants:  None  Procedures:  None  Antibiotics:  None  HPI/Subjective: Pt has no new complaints. No acute issues overnight.  Objective: Filed Vitals:   12/18/14 1034  BP: 157/56  Pulse: 105  Temp: 98.2 F (36.8 C)  Resp: 20    Intake/Output Summary (Last 24 hours) at 12/18/14 1628 Last data filed at 12/18/14 1500  Gross per 24 hour  Intake 1168.33 ml  Output    900 ml  Net 268.33 ml   Filed Weights   12/17/14 0051  Weight: 94.076 kg (207 lb 6.4 oz)    Exam:   General:  Pt in nad, alert and awake  Cardiovascular: rrr, no mrg  Respiratory: cta bl, no wheezes  Abdomen: soft, ND, NT  Musculoskeletal: no cyanosis or clubbing   Data Reviewed: Basic Metabolic Panel:  Recent Labs Lab 12/16/14 1728 12/16/14 2052 12/17/14 0146 12/17/14 2359  NA 142 142 142 142  K 6.6* 6.0* 5.6* 5.0  CL 115* 113* 113* 115*  CO2 17* 20* 16* 17*  GLUCOSE 116* 106* 121* 97  BUN 50* 49* 47* 34*  CREATININE 2.67* 2.64* 2.48* 2.05*  CALCIUM 9.4 9.5 9.5 8.8*  MG  --  2.0  --   --    Liver Function Tests:  Recent Labs Lab 12/16/14 2052  AST 22  ALT 13*  ALKPHOS 57  BILITOT 0.2*  PROT 6.8  ALBUMIN 3.6   No results for input(s): LIPASE, AMYLASE in the last 168 hours. No results for input(s): AMMONIA in the last 168 hours. CBC:  Recent Labs Lab 12/16/14 1924 12/17/14 0146  WBC 5.9 8.0  HGB 8.1* 8.0*  HCT 25.7* 25.5*   MCV 92.4 93.1  PLT 198 193   Cardiac Enzymes: No results for input(s): CKTOTAL, CKMB, CKMBINDEX, TROPONINI in the last 168 hours. BNP (last 3 results) No results for input(s): BNP in the last 8760 hours.  ProBNP (last 3 results) No results for input(s): PROBNP in the last 8760 hours.  CBG:  Recent Labs Lab 12/17/14 1653 12/17/14 2109 12/18/14 0341 12/18/14 0736 12/18/14 1125  GLUCAP 101* 89 107* 80 116*    No results found for this or any previous visit (from the past 240 hour(s)).   Studies: X-ray Chest Pa And Lateral  12/17/2014  CLINICAL DATA:  Productive cough, SOB, and decreased oral intake x 3 weeks. EXAM: CHEST  2 VIEW COMPARISON:  01/28/2011 FINDINGS: Lateral view degraded by patient arm position. Right axillary node dissection. Midline trachea. Mild cardiomegaly with transverse aortic atherosclerosis. No pleural effusion or pneumothorax. Lower lobe predominant interstitial thickening. No congestive failure. No lobar consolidation. Surgical clips which project over the right axilla and right breast. IMPRESSION: No acute cardiopulmonary disease. Chronic pulmonary interstitial thickening. Cardiomegaly with aortic atherosclerosis. Electronically Signed   By: Abigail Miyamoto M.D.   On: 12/17/2014 09:02   Ct Head Wo Contrast  12/16/2014  CLINICAL DATA:  Status post fall. EXAM: CT HEAD WITHOUT CONTRAST TECHNIQUE: Contiguous  axial images were obtained from the base of the skull through the vertex without intravenous contrast. COMPARISON:  None. FINDINGS: There is no evidence of mass effect, midline shift, or extra-axial fluid collections. There is no evidence of a space-occupying lesion or intracranial hemorrhage. There is no evidence of a cortical-based area of acute infarction. There is generalized cerebral atrophy. There is periventricular white matter low attenuation likely secondary to microangiopathy. The ventricles and sulci are appropriate for the patient's age. The basal  cisterns are patent. Visualized portions of the orbits are unremarkable. The visualized portions of the paranasal sinuses and mastoid air cells are unremarkable. The osseous structures are unremarkable. IMPRESSION: 1. No acute intracranial pathology. 2. Chronic microvascular disease and cerebral atrophy. Electronically Signed   By: Kathreen Devoid   On: 12/16/2014 19:06   Dg Hand Complete Right  12/16/2014  CLINICAL DATA:  Status post fall.  Right hand pain. EXAM: RIGHT HAND - COMPLETE 3+ VIEW COMPARISON:  None. FINDINGS: No acute fracture or dislocation. Generalized osteopenia. There is fragmentation of the right trapezoid which appears partially resected. There is mild osteoarthritis of the second CMC joint. There is mild osteoarthritis of the first and second MCP joints. There is ulnar minus variance. There is no soft tissue abnormality. IMPRESSION: 1. No acute osseous injury of the right hand. Electronically Signed   By: Kathreen Devoid   On: 12/16/2014 19:09    Scheduled Meds: . dextrose  1 ampule Intravenous Once  . docusate sodium  100 mg Oral BID  . enoxaparin (LOVENOX) injection  30 mg Subcutaneous Daily  . feeding supplement (GLUCERNA SHAKE)  237 mL Oral TID BM  . fluticasone  2 spray Each Nare Daily  . insulin aspart  0-5 Units Subcutaneous QHS  . insulin aspart  0-9 Units Subcutaneous TID WC  . ipratropium-albuterol  3 mL Nebulization Q6H  . loratadine  10 mg Oral Daily  . mometasone-formoterol  2 puff Inhalation BID  . nebivolol  10 mg Oral Daily  . pantoprazole  80 mg Oral Daily  . sodium chloride  3 mL Intravenous Q12H   Continuous Infusions: . sodium chloride 100 mL/hr at 12/18/14 1055     Time spent: > 35 minutes   Velvet Bathe  [facility] Pager [phone]. If 7PM-7AM, please contact night-coverage at [email or web address], password [facility] 12/18/2014, 4:28 PM  LOS: 2 days

## 2014-12-18 NOTE — Progress Notes (Signed)
Initial Nutrition Assessment  DOCUMENTATION CODES:   Obesity unspecified  INTERVENTION:  Provide Glucerna Shake po TID, each supplement provides 220 kcal and 10 grams of protein.  Encourage adequate PO intake.   NUTRITION DIAGNOSIS:   Inadequate oral intake related to poor appetite as evidenced by meal completion < 25%.  GOAL:   Patient will meet greater than or equal to 90% of their needs  MONITOR:   PO intake, Supplement acceptance, Weight trends, Labs, I & O's  REASON FOR ASSESSMENT:   Malnutrition Screening Tool    ASSESSMENT:   79yo woman with PMH of HTN, Asthma, DM, GERD, arthritis, h/o Rt sided breast cancer who presents after a fall. Ms. Stacey Zamora reports that she has not been feeling well for about a month now. she has had progressive SOB, cough, productive of occasional white phlegm, decreased appetite and decreased PO intake, cramping in her stomach. In the ED, she was found to have an AKI (unknown baseline at this time), K of 6.6.  Pt reports having a decreased appetite which has been ongoing over the past 1 month. Current meal completion has been 25%. Pt reports she has been consuming only one meal a day along with drinking Glucerna Shake BID. Pt reports having weight loss, however unable to recall usual body weight. Pt is agreeable to Glucerna. RD to order. Pt was encouraged to eat her food at meals. Pt with no observed significant fat or muscle mass loss.   Labs and medications reviewed.   Diet Order:  Diet Heart Room service appropriate?: Yes; Fluid consistency:: Thin  Skin:  Reviewed, no issues  Last BM:  10/16  Height:   Ht Readings from Last 1 Encounters:  12/17/14 5\' 2"  (1.575 m)    Weight:   Wt Readings from Last 1 Encounters:  12/17/14 207 lb 6.4 oz (94.076 kg)    Ideal Body Weight:  50 kg  BMI:  Body mass index is 37.92 kg/(m^2).  Estimated Nutritional Needs:   Kcal:  1800-1950  Protein:  90-110 grams  Fluid:  Per MD  EDUCATION  NEEDS:   No education needs identified at this time  Corrin Parker, MS, RD, LDN Pager # [phone] After hours/ weekend pager # [phone]

## 2014-12-18 NOTE — Progress Notes (Signed)
Upon assessment, patient's left arm was very edematous due to IV infiltration.  IV removed.  IV team unable to [place] left arm. Patient has history of right breast cancer and surgery, which she stated occurred in 1995. Paged NP on-call regarding situation.  NP stated it was ok for patient to have IV in right arm.  Will notify IV team for new IV.

## 2014-12-19 LAB — BASIC METABOLIC PANEL
ANION GAP: 10 (ref 5–15)
BUN: 28 mg/dL — ABNORMAL HIGH (ref 6–20)
CALCIUM: 8.3 mg/dL — AB (ref 8.9–10.3)
CHLORIDE: 110 mmol/L (ref 101–111)
CO2: 18 mmol/L — AB (ref 22–32)
Creatinine, Ser: 1.76 mg/dL — ABNORMAL HIGH (ref 0.44–1.00)
GFR calc non Af Amer: 27 mL/min — ABNORMAL LOW (ref >60)
GFR, EST AFRICAN AMERICAN: 31 mL/min — AB (ref >60)
Glucose, Bld: 98 mg/dL (ref 65–99)
Potassium: 4 mmol/L (ref 3.5–5.1)
Sodium: 138 mmol/L (ref 135–145)

## 2014-12-19 LAB — GLUCOSE, CAPILLARY
GLUCOSE-CAPILLARY: 98 mg/dL (ref 65–99)
Glucose-Capillary: 83 mg/dL (ref 65–99)
Glucose-Capillary: 91 mg/dL (ref 65–99)
Glucose-Capillary: 90 mg/dL (ref 65–99)

## 2014-12-19 MED ORDER — BENZONATATE 100 MG PO CAPS
100.0000 mg | ORAL_CAPSULE | Freq: Three times a day (TID) | ORAL | Status: DC | PRN
  Administered 2014-12-19 – 2014-12-20 (×2): 100 mg via ORAL
  Filled 2014-12-19 (×2): qty 1

## 2014-12-19 NOTE — Progress Notes (Signed)
[facility] PROGRESS NOTE  Stacey Zamora ENI:[id number] DOB: Jan 02, 1936 DOA: 12/16/2014 PCP: Philis Fendt, MD   Brief narrative: Patient is a 79 year old the presented to the hospital with renal insufficiency and hyperkalemia.  Assessment/Plan: Active Problems:   Renal insufficiency - elevated bun creatinine ratio, suspect prerenal etiology - Continues improvement with IV fluids. Will gently hydrate the next 24 hours and consider discharge next a.m.    Diabetes type 2, controlled ([place]) - Pt is on SSI    HTN (hypertension) -Pt currently on nebivolol    Hyperkalemia - resolved, patient was given IV fluids and Kayexalate    Anemia - stable, no active bleeding  Code Status: full Family Communication: None at bedside Disposition Plan: DC with continued improvement in 1-2 days   Consultants:  None  Procedures:  None  Antibiotics:  None  HPI/Subjective: Patient has been complaining of nonproductive cough.  Objective: Filed Vitals:   12/19/14 0948  BP: 108/91  Pulse: 78  Temp: 98.6 F (37 C)  Resp: 18    Intake/Output Summary (Last 24 hours) at 12/19/14 1104 Last data filed at 12/19/14 0951  Gross per 24 hour  Intake 2991.67 ml  Output    350 ml  Net 2641.67 ml   Filed Weights   12/17/14 0051 12/18/14 2100  Weight: 94.076 kg (207 lb 6.4 oz) 96.843 kg (213 lb 8 oz)    Exam:   General:  Pt in nad, alert and awake  Cardiovascular: rrr, no mrg  Respiratory: cta bl, no wheezes, equal chest rise   Abdomen: soft, ND, NT  Musculoskeletal: no cyanosis or clubbing   Data Reviewed: Basic Metabolic Panel:  Recent Labs Lab 12/16/14 1728 12/16/14 2052 12/17/14 0146 12/17/14 2359 12/18/14 2336  NA 142 142 142 142 138  K 6.6* 6.0* 5.6* 5.0 4.0  CL 115* 113* 113* 115* 110  CO2 17* 20* 16* 17* 18*  GLUCOSE 116* 106* 121* 97 98  BUN 50* 49* 47* 34* 28*  CREATININE 2.67* 2.64* 2.48* 2.05* 1.76*  CALCIUM 9.4 9.5 9.5 8.8* 8.3*  MG  --  2.0   --   --   --    Liver Function Tests:  Recent Labs Lab 12/16/14 2052  AST 22  ALT 13*  ALKPHOS 57  BILITOT 0.2*  PROT 6.8  ALBUMIN 3.6   No results for input(s): LIPASE, AMYLASE in the last 168 hours. No results for input(s): AMMONIA in the last 168 hours. CBC:  Recent Labs Lab 12/16/14 1924 12/17/14 0146  WBC 5.9 8.0  HGB 8.1* 8.0*  HCT 25.7* 25.5*  MCV 92.4 93.1  PLT 198 193   Cardiac Enzymes: No results for input(s): CKTOTAL, CKMB, CKMBINDEX, TROPONINI in the last 168 hours. BNP (last 3 results) No results for input(s): BNP in the last 8760 hours.  ProBNP (last 3 results) No results for input(s): PROBNP in the last 8760 hours.  CBG:  Recent Labs Lab 12/18/14 0736 12/18/14 1125 12/18/14 1637 12/18/14 2232 12/19/14 0751  GLUCAP 80 116* 85 111* 83    No results found for this or any previous visit (from the past 240 hour(s)).   Studies: No results found.  Scheduled Meds: . dextrose  1 ampule Intravenous Once  . docusate sodium  100 mg Oral BID  . enoxaparin (LOVENOX) injection  30 mg Subcutaneous Daily  . feeding supplement (GLUCERNA SHAKE)  237 mL Oral TID BM  . fluticasone  2 spray Each Nare Daily  . insulin aspart  0-5 Units  Subcutaneous QHS  . insulin aspart  0-9 Units Subcutaneous TID WC  . ipratropium-albuterol  3 mL Nebulization Q6H  . loratadine  10 mg Oral Daily  . mometasone-formoterol  2 puff Inhalation BID  . nebivolol  10 mg Oral Daily  . pantoprazole  80 mg Oral Daily  . sodium chloride  3 mL Intravenous Q12H   Continuous Infusions: . sodium chloride 1,000 mL (12/19/14 0804)     Time spent: > 35 minutes   Velvet Bathe  [facility] Pager [phone]. If 7PM-7AM, please contact night-coverage at [email or web address], password [facility] 12/19/2014, 11:04 AM  LOS: 3 days

## 2014-12-19 NOTE — Care Management Important Message (Signed)
Important Message  Patient Details  Name: Stacey Zamora MRN: [id number] Date of Birth: 1935/12/29   Medicare Important Message Given:  Yes-second notification given    Delorse Lek 12/19/2014, 3:06 PM

## 2014-12-19 NOTE — Progress Notes (Signed)
Assessed the PIV in the R hand. IV in infusing at 65ml/hr with out any signs of inflitration or pain to the patient. The patient reports having a right mastectomy in 1995 with lymph node removal and has a "restricted" pink arm band on the right arm. Merleen Nicely RN states that the MD has given orders to place a PIV in the right arm since the left arm is edematous. I advised that the Oncologist may need to give an order. PIV was left in place because there were not any indications that it needs to be changed at this time, patient agreed. [employer]ina [employer]

## 2014-12-20 DIAGNOSIS — N179 Acute kidney failure, unspecified: Secondary | ICD-10-CM

## 2014-12-20 DIAGNOSIS — D649 Anemia, unspecified: Secondary | ICD-10-CM

## 2014-12-20 DIAGNOSIS — I10 Essential (primary) hypertension: Secondary | ICD-10-CM

## 2014-12-20 DIAGNOSIS — N183 Chronic kidney disease, stage 3 (moderate): Secondary | ICD-10-CM

## 2014-12-20 DIAGNOSIS — E119 Type 2 diabetes mellitus without complications: Secondary | ICD-10-CM

## 2014-12-20 DIAGNOSIS — E875 Hyperkalemia: Principal | ICD-10-CM

## 2014-12-20 LAB — BASIC METABOLIC PANEL
Anion gap: 12 (ref 5–15)
BUN: 24 mg/dL — AB (ref 6–20)
CALCIUM: 9.3 mg/dL (ref 8.9–10.3)
CHLORIDE: 114 mmol/L — AB (ref 101–111)
CO2: 19 mmol/L — ABNORMAL LOW (ref 22–32)
CREATININE: 1.71 mg/dL — AB (ref 0.44–1.00)
GFR calc Af Amer: 32 mL/min — ABNORMAL LOW (ref >60)
GFR, EST NON AFRICAN AMERICAN: 28 mL/min — AB (ref >60)
Glucose, Bld: 98 mg/dL (ref 65–99)
Potassium: 4 mmol/L (ref 3.5–5.1)
SODIUM: 145 mmol/L (ref 135–145)

## 2014-12-20 LAB — GLUCOSE, CAPILLARY
GLUCOSE-CAPILLARY: 92 mg/dL (ref 65–99)
Glucose-Capillary: 97 mg/dL (ref 65–99)
Glucose-Capillary: 113 mg/dL — ABNORMAL HIGH (ref 65–99)
Glucose-Capillary: 96 mg/dL (ref 65–99)

## 2014-12-20 LAB — T4, FREE: Free T4: 2.01 ng/dL — ABNORMAL HIGH (ref 0.61–1.12)

## 2014-12-20 MED ORDER — IPRATROPIUM-ALBUTEROL 0.5-2.5 (3) MG/3ML IN SOLN: 3.0000 mL | RESPIRATORY_TRACT | Status: DC | PRN

## 2014-12-20 MED ORDER — ACETAMINOPHEN 325 MG PO TABS
650.0000 mg | ORAL_TABLET | Freq: Four times a day (QID) | ORAL | Status: DC | PRN
  Administered 2014-12-20 – 2014-12-21 (×3): 650 mg via ORAL
  Filled 2014-12-20 (×3): qty 2

## 2014-12-20 NOTE — Progress Notes (Addendum)
[facility]                                                                              Patient Demographics  Stacey Zamora, is a 79 y.o. female, DOB - [birth year], BEE:[id number]  Admit date - 12/16/2014   Admitting Physician Sid Falcon, MD  Outpatient Primary MD for the patient is Philis Fendt, MD  LOS - 4   Chief Complaint  Patient presents with  . Fall      HPI on 12/16/2014 by Dr. Gilles Chiquito Ms. Leason is a 79yo woman with PMH of HTN, Asthma, DM, GERD, arthritis, h/o Rt sided breast cancer who presents after a fall. Ms. Ricci reports that she was walking up 2 steps to the porch. She doesn't remember exactly what happened, but the next thing she knew she had fallen down. She was lightheaded prior to the event. She landed on her nose and wrist. She has a scrape on her nose. Ms. Nippert reports that she has not been feeling well for about a month now. She went to see her PCP on 9/22 for a cough and SOB and was diagnosed with a sinus infection and treated with Abx for about 5 days. However, since that time, she has had progressive SOB, cough, productive of occasional white phlegm, decreased appetite and decreased PO intake, cramping in her stomach. She also notes a daily headache since that time, right frontal, sharp, comes and goes. She has been taking one tylenol tablet about every four hours for this headache and it occasionally helps, but not always. The pain is always in the same place. She has no associated blurry vision, double vision, pain in her eyes, photophobia. She has chronic cramping in her hands and legs, chronic feeling of being cold. For the cramping, her PCP has placed her on KCl and mag oxide which she has continued to take. Further symptoms include dry mouth and swelling in the ankles, which are chronic. She reports no change in urinary habits, no melena or dark stools, no obvious loss of blood from any source.   In the ED, she was found to have an  AKI (unknown baseline at this time), K of 6.6. This was treated with insulin, D50, calcium gluconate and kayexalate and came down to 6.0. EKG showed no peaking of the twaves. She had a CT of her head in the ED which did not show any acute findings. She also had a wrist xray which showed no acute injury.   Assessment & Plan   Acute on chronic kidney disease, stage III -Upon admission, creatinine 2.67 -Creatinine improving, currently 1.71 -Upon review patient's chart, appears her baseline creatinine is approximately 1.1 -Suspect prerenal etiology, patient has improved with IV hydration  Diabetes mellitus, type II -Continue insulin sliding scale with CBG monitoring -Hemoglobin A1c 6.1  Hypertension -Continue Bystolic  Hyperkalemia -Resolved, patient was given Kayexalate, calcium, D50 with insulin upon admission as her potassium was 6 -Continue to monitor BMP  Normocytic Anemia -Baseline hemoglobin approximately 10 (2012), currently 8 -Suspect secondary to chronic disease -Anemia panel: Iron 65, ferritin 173 -FOBT negative  Cough -Patient states has improved  Generalized weakness -PT consulted and pending -  TSH 0.019, pending FT4  Code Status: Full  Family Communication: None at bedside  Disposition Plan: Admitted. Pending PT evaluation  Time Spent in minutes   30 minutes  Procedures  None  Consults   None  DVT Prophylaxis  Lovenox  Lab Results  Component Value Date   PLT 193 12/17/2014    Medications  Scheduled Meds: . dextrose  1 ampule Intravenous Once  . docusate sodium  100 mg Oral BID  . enoxaparin (LOVENOX) injection  30 mg Subcutaneous Daily  . feeding supplement (GLUCERNA SHAKE)  237 mL Oral TID BM  . fluticasone  2 spray Each Nare Daily  . insulin aspart  0-5 Units Subcutaneous QHS  . insulin aspart  0-9 Units Subcutaneous TID WC  . ipratropium-albuterol  3 mL Nebulization Q6H  . loratadine  10 mg Oral Daily  . mometasone-formoterol  2 puff  Inhalation BID  . nebivolol  10 mg Oral Daily  . pantoprazole  80 mg Oral Daily  . sodium chloride  3 mL Intravenous Q12H   Continuous Infusions: . sodium chloride 50 mL/hr at 12/20/14 0130   PRN Meds:.acetaminophen, albuterol, benzonatate, hydrALAZINE, ondansetron **OR** ondansetron (ZOFRAN) IV, senna-docusate, traMADol, traZODone  Antibiotics    Anti-infectives    None     Subjective:   Kayren Eaves seen and examined today.  Patient has no complaints this morning. Feels that she is feeling somewhat better. Denies any chest pain, shortness of breath, cough, abdominal pain.  Objective:   Filed Vitals:   12/20/14 0616 12/20/14 0802 12/20/14 0944 12/20/14 0954  BP: 157/71 164/61  155/75  Pulse: 96 91 90 85  Temp: 98.7 F (37.1 C) 98 F (36.7 C)  98.5 F (36.9 C)  TempSrc: Oral Oral  Oral  Resp: 14 15 16 15   Height:      Weight:      SpO2: 94% 95% 95% 99%    Wt Readings from Last 3 Encounters:  12/19/14 96.48 kg (212 lb 11.2 oz)  01/28/11 85.73 kg (189 lb)     Intake/Output Summary (Last 24 hours) at 12/20/14 1159 Last data filed at 12/20/14 1152  Gross per 24 hour  Intake 2182.5 ml  Output    850 ml  Net 1332.5 ml    Exam  General: Well developed, well nourished, NAD, appears stated age  HEENT: NCAT, PERRLA, EOMI, Anicteic Sclera, mucous membranes moist.   Neck: Supple, no JVD, no masses  Cardiovascular: S1 S2 auscultated, no rubs, murmurs or gallops. Regular rate and rhythm.  Respiratory: Clear to auscultation bilaterally with equal chest rise  Abdomen: Soft, nontender, nondistended, + bowel sounds  Extremities: warm dry without cyanosis clubbing. Trace LE edema.  Neuro: AAOx3, nonfocal  Data Review   Micro Results No results found for this or any previous visit (from the past 240 hour(s)).  Radiology Reports X-ray Chest Pa And Lateral  12/17/2014  CLINICAL DATA:  Productive cough, SOB, and decreased oral intake x 3 weeks. EXAM: CHEST  2 VIEW  COMPARISON:  01/28/2011 FINDINGS: Lateral view degraded by patient arm position. Right axillary node dissection. Midline trachea. Mild cardiomegaly with transverse aortic atherosclerosis. No pleural effusion or pneumothorax. Lower lobe predominant interstitial thickening. No congestive failure. No lobar consolidation. Surgical clips which project over the right axilla and right breast. IMPRESSION: No acute cardiopulmonary disease. Chronic pulmonary interstitial thickening. Cardiomegaly with aortic atherosclerosis. Electronically Signed   By: Abigail Miyamoto M.D.   On: 12/17/2014 09:02   Ct Head Wo Contrast  12/16/2014  CLINICAL DATA:  Status post fall. EXAM: CT HEAD WITHOUT CONTRAST TECHNIQUE: Contiguous axial images were obtained from the base of the skull through the vertex without intravenous contrast. COMPARISON:  None. FINDINGS: There is no evidence of mass effect, midline shift, or extra-axial fluid collections. There is no evidence of a space-occupying lesion or intracranial hemorrhage. There is no evidence of a cortical-based area of acute infarction. There is generalized cerebral atrophy. There is periventricular white matter low attenuation likely secondary to microangiopathy. The ventricles and sulci are appropriate for the patient's age. The basal cisterns are patent. Visualized portions of the orbits are unremarkable. The visualized portions of the paranasal sinuses and mastoid air cells are unremarkable. The osseous structures are unremarkable. IMPRESSION: 1. No acute intracranial pathology. 2. Chronic microvascular disease and cerebral atrophy. Electronically Signed   By: Kathreen Devoid   On: 12/16/2014 19:06   Dg Hand Complete Right  12/16/2014  CLINICAL DATA:  Status post fall.  Right hand pain. EXAM: RIGHT HAND - COMPLETE 3+ VIEW COMPARISON:  None. FINDINGS: No acute fracture or dislocation. Generalized osteopenia. There is fragmentation of the right trapezoid which appears partially resected.  There is mild osteoarthritis of the second CMC joint. There is mild osteoarthritis of the first and second MCP joints. There is ulnar minus variance. There is no soft tissue abnormality. IMPRESSION: 1. No acute osseous injury of the right hand. Electronically Signed   By: Kathreen Devoid   On: 12/16/2014 19:09    CBC  Recent Labs Lab 12/16/14 1924 12/17/14 0146  WBC 5.9 8.0  HGB 8.1* 8.0*  HCT 25.7* 25.5*  PLT 198 193  MCV 92.4 93.1  MCH 29.1 29.2  MCHC 31.5 31.4  RDW 14.2 14.3    Chemistries   Recent Labs Lab 12/16/14 2052 12/17/14 0146 12/17/14 2359 12/18/14 2336 12/20/14 0756  NA 142 142 142 138 145  K 6.0* 5.6* 5.0 4.0 4.0  CL 113* 113* 115* 110 114*  CO2 20* 16* 17* 18* 19*  GLUCOSE 106* 121* 97 98 98  BUN 49* 47* 34* 28* 24*  CREATININE 2.64* 2.48* 2.05* 1.76* 1.71*  CALCIUM 9.5 9.5 8.8* 8.3* 9.3  MG 2.0  --   --   --   --   AST 22  --   --   --   --   ALT 13*  --   --   --   --   ALKPHOS 57  --   --   --   --   BILITOT 0.2*  --   --   --   --    ------------------------------------------------------------------------------------------------------------------ estimated creatinine clearance is 29.9 mL/min (by C-G formula based on Cr of 1.71). ------------------------------------------------------------------------------------------------------------------ No results for input(s): HGBA1C in the last 72 hours. ------------------------------------------------------------------------------------------------------------------ No results for input(s): CHOL, HDL, LDLCALC, TRIG, CHOLHDL, LDLDIRECT in the last 72 hours. ------------------------------------------------------------------------------------------------------------------ No results for input(s): TSH, T4TOTAL, T3FREE, THYROIDAB in the last 72 hours.  Invalid input(s): FREET3 ------------------------------------------------------------------------------------------------------------------ No results for  input(s): VITAMINB12, FOLATE, FERRITIN, TIBC, IRON, RETICCTPCT in the last 72 hours.  Coagulation profile No results for input(s): INR, PROTIME in the last 168 hours.  No results for input(s): DDIMER in the last 72 hours.  Cardiac Enzymes No results for input(s): CKMB, TROPONINI, MYOGLOBIN in the last 168 hours.  Invalid input(s): CK ------------------------------------------------------------------------------------------------------------------ Invalid input(s): POCBNP    Arrionna Serena D.O. on 12/20/2014 at 11:59 AM  Between 7am to 7pm - Pager - [phone]  After 7pm go  to [email or web address] - password [email or web address]  And look for the night coverage person covering for me after hours  [facility] Office  [phone]

## 2014-12-20 NOTE — Progress Notes (Signed)
Patient refused to have bed alarm on. RN educates patient about risks and patient verbalizes understanding. RN will continue to monitor patient.   Ermalinda Memos, RN

## 2014-12-20 NOTE — Care Management Note (Signed)
Case Management Note  Patient Details  Name: Stacey Zamora MRN: [id number] Date of Birth: May 15, 1935  Subjective/Objective:                Patient from home, independent. PT recommends HH PT. Anticipate DC today.  Spoke with patient, she had [facility] in the past and wanted to use them again. Referral made to [facility] for [facility] PT.    Action/Plan:   Expected Discharge Date:                  Expected Discharge Plan:  [place]  In-House Referral:     Discharge planning Services  CM Consult  Post Acute Care Choice:  Home Health Choice offered to:  Patient  DME Arranged:    DME Agency:     HH Arranged:  PT [place]:  [place]  Status of Service:  Completed, signed off  Medicare Important Message Given:  Yes-second notification given Date Medicare IM Given:    Medicare IM give by:    Date Additional Medicare IM Given:    Additional Medicare Important Message give by:     If discussed at [place] of Stay Meetings, dates discussed:    Additional Comments:  Carles Collet, RN 12/20/2014, 2:38 PM

## 2014-12-20 NOTE — Evaluation (Signed)
Physical Therapy Evaluation Patient Details Name: Stacey Zamora MRN: [id number] DOB: [birth year] Today's Date: 12/20/2014   History of Present Illness  Ms. Loveless is a 79yo woman with PMH of HTN, Asthma, DM, GERD, arthritis, h/o Rt sided breast cancer who presents after a fall. Ms. Ackerley reports that she was walking up 2 steps to the porch. She doesn't remember exactly what happened, but the next thing she knew she had fallen down. She was lightheaded prior to the event. She landed on her nose and wrist. She has a scrape on her nose.    Clinical Impression  Pt admitted with above. Pt was amb with [facility] PTA and had an aide to assist with bathing, dressing, meal prep and cooking. Pt now requires RW for safe ambulation and demo's decreased activity tolerance. PT to benefit from HHPT to address these deficits and return to amb with SPC.    Follow Up Recommendations Home health PT;Supervision - Intermittent    Equipment Recommendations  None recommended by PT    Recommendations for Other Services       Precautions / Restrictions Precautions Precautions: Fall Precaution Comments: history of fall Restrictions Weight Bearing Restrictions: No      Mobility  Bed Mobility Overal bed mobility: Modified Independent             General bed mobility comments: HOB elevated, increased time, use of hands but no physical assist required  Transfers Overall transfer level: Needs assistance Equipment used: Rolling walker (2 wheeled) Transfers: Sit to/from Stand Sit to Stand: Min guard         General transfer comment: v/c's to push up from bed not pull up on walker  Ambulation/Gait Ambulation/Gait assistance: Min guard Ambulation Distance (Feet): 50 Feet Assistive device: Rolling walker (2 wheeled) Gait Pattern/deviations: Step-through pattern;Decreased stride length Gait velocity: decreased   General Gait Details: + SOB, pt reports this to be normal  Chief Strategy Officer    Modified Rankin (Stroke Patients Only)       Balance Overall balance assessment: Needs assistance         Standing balance support: No upper extremity supported;During functional activity Standing balance-Leahy Scale: Fair Standing balance comment: pt able to stand at sink without difficulty but fatigues quickly and uses unilateral UE support                             Pertinent Vitals/Pain Pain Assessment: No/denies pain    Home Living Family/patient expects to be discharged to:: Private residence Living Arrangements: Children Available Help at Discharge: Family;Available 24 hours/day (AIDE 8-1 m-f to assist with bathing/cleaning) Type of Home: House Home Access: Stairs to enter Entrance Stairs-Rails: None Entrance Stairs-Number of Steps: 1 Home Layout: One level Home Equipment: None;Walker - 2 wheels;Walker - 4 wheels;Cane - single point      Prior Function Level of Independence: Needs assistance   Gait / Transfers Assistance Needed: uses cane primarily  ADL's / Homemaking Assistance Needed: aide assists with bathing, cleaning and meal prep  Comments: pt uses cane primarily and has an aide 8-1 that walks with her     Hand Dominance   Dominant Hand: Right    Extremity/Trunk Assessment   Upper Extremity Assessment: Generalized weakness           Lower Extremity Assessment: Generalized weakness      Cervical / Trunk Assessment: Normal  Communication   Communication: No difficulties  Cognition Arousal/Alertness: Awake/alert Behavior During Therapy: WFL for tasks assessed/performed Overall Cognitive Status: Within Functional Limits for tasks assessed                      General Comments      Exercises        Assessment/Plan    PT Assessment Patient needs continued PT services  PT Diagnosis Generalized weakness   PT Problem List Decreased strength;Decreased activity tolerance;Decreased  balance;Decreased mobility  PT Treatment Interventions DME instruction;Gait training;Stair training;Functional mobility training;Therapeutic activities;Therapeutic exercise   PT Goals (Current goals can be found in the Care Plan section) Acute Rehab PT Goals Patient Stated Goal: home PT Goal Formulation: With patient Time For Goal Achievement: 12/27/14 Potential to Achieve Goals: Good    Frequency Min 3X/week   Barriers to discharge        Co-evaluation               End of Session Equipment Utilized During Treatment: Gait belt Activity Tolerance: Patient tolerated treatment well Patient left: in chair;with call bell/phone within reach Nurse Communication: Mobility status         Time: 6333-5456 PT Time Calculation (min) (ACUTE ONLY): 16 min   Charges:   PT Evaluation $Initial PT Evaluation Tier I: 1 Procedure     PT G CodesKingsley Callander 12/20/2014, 2:03 PM   Kittie Plater, PT, DPT Pager #: [phone] Office #: [phone]

## 2014-12-20 NOTE — Evaluation (Signed)
Clinical/Bedside Swallow Evaluation Patient Details  Name: Stacey Zamora MRN: [id number] Date of Birth: Apr 16, 1935  Today's Date: 12/20/2014 Time: SLP Start Time (ACUTE ONLY): 1525 SLP Stop Time (ACUTE ONLY): 1534 SLP Time Calculation (min) (ACUTE ONLY): 9 min  Past Medical History:  Past Medical History  Diagnosis Date  . Hypertension   . Asthma   . Shortness of breath   . Cancer ([place])     right breast  . GERD (gastroesophageal reflux disease)   . Arthritis   . Pneumonia   . Diabetes mellitus    Past Surgical History:  Past Surgical History  Procedure Laterality Date  . Wrist surgery    . Breast surgery    . Abdominal hysterectomy    . Cholecystectomy    . Carpel tunnel     HPI:  Stacey Zamora is a 79yo woman with PMH of HTN, Asthma, DM, GERD, arthritis, h/o Rt sided breast cancer who presents after a fall. Ms. Turrubiates reports that she was walking up 2 steps to the porch. She doesn't remember exactly what happened, but the next thing she knew she had fallen down. She was lightheaded prior to the event. She landed on her nose and wrist. She has a scrape on her nose.   Assessment / Plan / Recommendation Clinical Impression  Pt's oropharyngeal dysphagia appears WFL, although she does have subjective complaints of food getting "stuck", pointing to the level of her sternum. She says that she has been having increased coughing since 9/22, and she attributes some of this to her globus sensation. MD may wish to pursue esophageal w/u. SLP to sign off.    Aspiration Risk  Mild    Diet Recommendation Age appropriate regular solids;Thin   Medication Administration: Whole meds with liquid Compensations: Slow rate;Small sips/bites    Other  Recommendations Oral Care Recommendations: Oral care BID   Follow Up Recommendations       Frequency and Duration        Pertinent Vitals/Pain n/a    SLP Swallow Goals     Swallow Study Prior Functional Status  Type of Home:  House Available Help at Discharge: Family;Available 24 hours/day (AIDE 8-1 m-f to assist with bathing/cleaning)    General Other Pertinent Information: Stacey Zamora is a 79yo woman with PMH of HTN, Asthma, DM, GERD, arthritis, h/o Rt sided breast cancer who presents after a fall. [name] reports that she was walking up 2 steps to the porch. She doesn't remember exactly what happened, but the next thing she knew she had fallen down. She was lightheaded prior to the event. She landed on her nose and wrist. She has a scrape on her nose. Type of Study: Bedside swallow evaluation Previous Swallow Assessment: none in chart Diet Prior to this Study: Regular;Thin liquids Temperature Spikes Noted: No Respiratory Status: Room air History of Recent Intubation: No Behavior/Cognition: Alert;Cooperative;Pleasant mood Self-Feeding Abilities: Able to feed self Patient Positioning: Upright in chair/Tumbleform Baseline Vocal Quality: Normal    Oral/Motor/Sensory Function Overall Oral Motor/Sensory Function: Appears within functional limits for tasks assessed   Ice Chips Ice chips: Not tested   Thin Liquid Thin Liquid: Within functional limits Presentation: Self Fed;Straw    Nectar Thick Nectar Thick Liquid: Not tested   Honey Thick Honey Thick Liquid: Not tested   Puree Puree: Within functional limits Presentation: Self Fed;Spoon   Solid   Solid: Within functional limits Presentation: Self Fed      Germain Osgood, M.A. CCC-SLP [phone]  Germain Osgood  12/20/2014,4:08 PM

## 2014-12-21 ENCOUNTER — Inpatient Hospital Stay (HOSPITAL_COMMUNITY): Payer: Medicare Other

## 2014-12-21 DIAGNOSIS — N289 Disorder of kidney and ureter, unspecified: Secondary | ICD-10-CM

## 2014-12-21 LAB — BASIC METABOLIC PANEL
Anion gap: 9 (ref 5–15)
BUN: 19 mg/dL (ref 6–20)
CHLORIDE: 115 mmol/L — AB (ref 101–111)
CO2: 19 mmol/L — ABNORMAL LOW (ref 22–32)
CREATININE: 1.54 mg/dL — AB (ref 0.44–1.00)
Calcium: 9.4 mg/dL (ref 8.9–10.3)
GFR, EST AFRICAN AMERICAN: 36 mL/min — AB (ref >60)
GFR, EST NON AFRICAN AMERICAN: 31 mL/min — AB (ref >60)
Glucose, Bld: 97 mg/dL (ref 65–99)
POTASSIUM: 4 mmol/L (ref 3.5–5.1)
SODIUM: 143 mmol/L (ref 135–145)

## 2014-12-21 LAB — CBC
HEMATOCRIT: 25.4 % — AB (ref 36.0–46.0)
HEMOGLOBIN: 8.1 g/dL — AB (ref 12.0–15.0)
MCH: 29.2 pg (ref 26.0–34.0)
MCHC: 31.9 g/dL (ref 30.0–36.0)
MCV: 91.7 fL (ref 78.0–100.0)
Platelets: 207 10*3/uL (ref 150–400)
RBC: 2.77 MIL/uL — ABNORMAL LOW (ref 3.87–5.11)
RDW: 14.2 % (ref 11.5–15.5)
WBC: 9.3 10*3/uL (ref 4.0–10.5)

## 2014-12-21 LAB — GLUCOSE, CAPILLARY
GLUCOSE-CAPILLARY: 76 mg/dL (ref 65–99)
Glucose-Capillary: 103 mg/dL — ABNORMAL HIGH (ref 65–99)
Glucose-Capillary: 79 mg/dL (ref 65–99)

## 2014-12-21 LAB — BRAIN NATRIURETIC PEPTIDE: B NATRIURETIC PEPTIDE 5: 627.4 pg/mL — AB (ref 0.0–100.0)

## 2014-12-21 MED ORDER — GLUCERNA SHAKE PO LIQD: 237.0000 mL | Freq: Three times a day (TID) | ORAL | Status: AC

## 2014-12-21 MED ORDER — MEGESTROL ACETATE 400 MG/10ML PO SUSP: 200.0000 mg | Freq: Every day | ORAL | Status: AC

## 2014-12-21 MED ORDER — BENZONATATE 100 MG PO CAPS: 100.0000 mg | ORAL_CAPSULE | Freq: Three times a day (TID) | ORAL | Status: AC | PRN

## 2014-12-21 MED ORDER — MEGESTROL ACETATE 400 MG/10ML PO SUSP
200.0000 mg | Freq: Every day | ORAL | Status: DC
  Administered 2014-12-21: 200 mg via ORAL
  Filled 2014-12-21: qty 5

## 2014-12-21 MED ORDER — DOCUSATE SODIUM 100 MG PO CAPS: 100.0000 mg | ORAL_CAPSULE | Freq: Two times a day (BID) | ORAL | Status: AC

## 2014-12-21 MED ORDER — HYDRALAZINE HCL 20 MG/ML IJ SOLN: 10.0000 mg | Freq: Four times a day (QID) | INTRAMUSCULAR | Status: DC | PRN

## 2014-12-21 NOTE — Progress Notes (Signed)
  Echocardiogram 2D Echocardiogram has been performed.  [name] 12/21/2014, 2:49 PM

## 2014-12-21 NOTE — Discharge Summary (Signed)
Physician Discharge Summary  Stacey Zamora CVE:[id number] DOB: [birth year] DOA: 12/16/2014  PCP: Philis Fendt, MD  Admit date: 12/16/2014 Discharge date: 12/21/2014  Time spent: 45 minutes  Recommendations for Outpatient Follow-up:  Patient will be discharged to home with home health PT.  Patient will need to follow up with primary care provider within one week of discharge, discuss thyroid as well as appetite, repeat CBC and BMP.  Patient should continue medications as prescribed.  Patient should follow a heart healthy/carb modified diet.   Discharge Diagnoses:  Acute on chronic kidney disease, stage III Dyspnea with exertion Diabetes mellitus, type II Hypertension Hyperkalemia Normocytic anemia Cough Generalized weakness Abnormal TSH  Discharge Condition: Stable  Diet recommendation: heart healthy/carb modified  Filed Weights   12/18/14 2100 12/19/14 2059 12/20/14 2134  Weight: 96.843 kg (213 lb 8 oz) 96.48 kg (212 lb 11.2 oz) 94.666 kg (208 lb 11.2 oz)    History of present illness:  on 12/16/2014 by Dr. Gilles Chiquito Stacey Zamora is a 79yo woman with PMH of HTN, Asthma, DM, GERD, arthritis, h/o Rt sided breast cancer who presents after a fall. Stacey Zamora reports that she was walking up 2 steps to the porch. She doesn't remember exactly what happened, but the next thing she knew she had fallen down. She was lightheaded prior to the event. She landed on her nose and wrist. She has a scrape on her nose. Stacey Zamora reports that she has not been feeling well for about a month now. She went to see her PCP on 9/22 for a cough and SOB and was diagnosed with a sinus infection and treated with Abx for about 5 days. However, since that time, she has had progressive SOB, cough, productive of occasional white phlegm, decreased appetite and decreased PO intake, cramping in her stomach. She also notes a daily headache since that time, right frontal, sharp, comes and goes. She has been  taking one tylenol tablet about every four hours for this headache and it occasionally helps, but not always. The pain is always in the same place. She has no associated blurry vision, double vision, pain in her eyes, photophobia. She has chronic cramping in her hands and legs, chronic feeling of being cold. For the cramping, her PCP has placed her on KCl and mag oxide which she has continued to take. Further symptoms include dry mouth and swelling in the ankles, which are chronic. She reports no change in urinary habits, no melena or dark stools, no obvious loss of blood from any source.   In the ED, she was found to have an AKI (unknown baseline at this time), K of 6.6. This was treated with insulin, D50, calcium gluconate and kayexalate and came down to 6.0. EKG showed no peaking of the twaves. She had a CT of her head in the ED which did not show any acute findings. She also had a wrist xray which showed no acute injury.   Hospital Course:  Acute on chronic kidney disease, stage III -Upon admission, creatinine 2.67 -Creatinine improving, currently 1.54 -Upon review patient's chart, appears her baseline creatinine is approximately 1.1 -Suspect prerenal etiology, patient has improved with IV hydration  Dyspnea with exertion -BNP 627 -Echocardiogram: EF 60-65%, PA pressure 58mmHg -Patient wished to follow up with her PCP  Diabetes mellitus, type II -Was placed on insulin sliding scale with CBG monitoring during hospitalization -Hemoglobin A1c 6.1 -Continue pioglitazone upon discharge  Hypertension -Continue Bystolic -Hyzaar held due to AKI  Hyperkalemia -  Resolved, patient was given Kayexalate, calcium, D50 with insulin upon admission as her potassium was 6 -Continue to monitor BMP  Normocytic Anemia -Baseline hemoglobin approximately 10 (2012), currently 8.1 -Suspect secondary to chronic disease -Anemia panel: Iron 65, ferritin 173 -FOBT negative  Cough -Patient states  has improved -Continue antitussives  Generalized weakness -PT consulted and pending  Abnormal TSH -TSH 0.019, FT4 2.01 (labs do not coincide with weakness) -Discussed with daughter.  Would have repeat labs once patient is discharged.  Would not want to start treatment for hyperthyroidism at this time.  Decreased appetite -Nutrition consulted and recommended Glucerna shakes -Patient started on Megace, should follow-up with her primary care doctor regarding this. This was discussed with her daughter.  Procedures: Echocardiogram  Consultations: None  Discharge Exam: Filed Vitals:   12/21/14 1438  BP: 149/56  Pulse:   Temp:   Resp:    Exam  General: Well developed, well nourished, NAD  HEENT: NCAT,mucous membranes moist.   Cardiovascular: S1 S2 auscultated, RRR  Respiratory: Clear to auscultation bilaterally   Abdomen: Soft, nontender, nondistended, + bowel sounds  Extremities: warm dry without cyanosis clubbing. Trace LE edema.  Neuro: AAOx3, nonfocal  Psych: pleasant  Discharge Instructions      Discharge Instructions    Discharge instructions    Complete by:  As directed   Patient will be discharged to home with home health PT.  Patient will need to follow up with primary care provider within one week of discharge, discuss thyroid as well as appetite, repeat CBC and BMP.  Patient should continue medications as prescribed.  Patient should follow a heart healthy/carb modified diet.            Medication List    STOP taking these medications        losartan-hydrochlorothiazide 100-12.5 MG tablet  Commonly known as:  HYZAAR     potassium chloride 10 MEQ tablet  Commonly known as:  K-DUR      TAKE these medications        ADVAIR DISKUS 250-50 MCG/DOSE Aepb  Generic drug:  Fluticasone-Salmeterol  Inhale 1 puff into the lungs 2 (two) times daily.     amitriptyline 50 MG tablet  Commonly known as:  ELAVIL  Take 50 mg by mouth at bedtime.      benzonatate 100 MG capsule  Commonly known as:  TESSALON  Take 1 capsule (100 mg total) by mouth 3 (three) times daily as needed for cough.     BYSTOLIC 10 MG tablet  Generic drug:  nebivolol  Take 10 mg by mouth daily.     docusate sodium 100 MG capsule  Commonly known as:  COLACE  Take 1 capsule (100 mg total) by mouth 2 (two) times daily.     feeding supplement (GLUCERNA SHAKE) Liqd  Take 237 mLs by mouth 3 (three) times daily between meals.     fluticasone 50 MCG/ACT nasal spray  Commonly known as:  FLONASE  Place 2 sprays into the nose daily.     gabapentin 100 MG capsule  Commonly known as:  NEURONTIN  Take 100 mg by mouth 3 (three) times daily.     loratadine 10 MG tablet  Commonly known as:  CLARITIN  Take 10 mg by mouth daily.     magnesium oxide 400 (241.3 MG) MG tablet  Commonly known as:  MAG-OX  Take 1 tablet by mouth daily.     megestrol 400 MG/10ML suspension  Commonly known as:  MEGACE  Take 5 mLs (200 mg total) by mouth daily.     omeprazole 20 MG capsule  Commonly known as:  PRILOSEC  Take 40 mg by mouth daily.     pioglitazone 30 MG tablet  Commonly known as:  ACTOS  Take 30 mg by mouth daily.     traMADol 50 MG tablet  Commonly known as:  ULTRAM  Take 1 tablet (50 mg total) by mouth every 6 (six) hours as needed.     traZODone 50 MG tablet  Commonly known as:  DESYREL  Take 50 mg by mouth at bedtime as needed for sleep.     VENTOLIN HFA 108 (90 BASE) MCG/ACT inhaler  Generic drug:  albuterol  Inhale 2 puffs into the lungs daily as needed for shortness of breath.       Allergies  Allergen Reactions  . Eggs Or Egg-Derived Products Hives  . Morphine And Related Itching and Other (See Comments)    Crawling sensations  . Vicodin [Hydrocodone-Acetaminophen] Itching and Other (See Comments)    Crawling sensations   Follow-up Information    Follow up with [facility].   Why:  Home Health PT. You will receive a call 24  to 48 hours after you get home to schedule a visit.   Contact information:   [address] [place] [address] [phone]       Follow up with Nolene Ebbs A, MD. Schedule an appointment as soon as possible for a visit in 1 week.   Specialty:  Internal Medicine   Why:  Hospital follow-up   Contact information:   [place] [address] [phone]        The results of significant diagnostics from this hospitalization (including imaging, microbiology, ancillary and laboratory) are listed below for reference.    Significant Diagnostic Studies: X-ray Chest Pa And Lateral  12/17/2014  CLINICAL DATA:  Productive cough, SOB, and decreased oral intake x 3 weeks. EXAM: CHEST  2 VIEW COMPARISON:  01/28/2011 FINDINGS: Lateral view degraded by patient arm position. Right axillary node dissection. Midline trachea. Mild cardiomegaly with transverse aortic atherosclerosis. No pleural effusion or pneumothorax. Lower lobe predominant interstitial thickening. No congestive failure. No lobar consolidation. Surgical clips which project over the right axilla and right breast. IMPRESSION: No acute cardiopulmonary disease. Chronic pulmonary interstitial thickening. Cardiomegaly with aortic atherosclerosis. Electronically Signed   By: Abigail Miyamoto M.D.   On: 12/17/2014 09:02   Ct Head Wo Contrast  12/16/2014  CLINICAL DATA:  Status post fall. EXAM: CT HEAD WITHOUT CONTRAST TECHNIQUE: Contiguous axial images were obtained from the base of the skull through the vertex without intravenous contrast. COMPARISON:  None. FINDINGS: There is no evidence of mass effect, midline shift, or extra-axial fluid collections. There is no evidence of a space-occupying lesion or intracranial hemorrhage. There is no evidence of a cortical-based area of acute infarction. There is generalized cerebral atrophy. There is periventricular white matter low attenuation likely secondary to microangiopathy. The  ventricles and sulci are appropriate for the patient's age. The basal cisterns are patent. Visualized portions of the orbits are unremarkable. The visualized portions of the paranasal sinuses and mastoid air cells are unremarkable. The osseous structures are unremarkable. IMPRESSION: 1. No acute intracranial pathology. 2. Chronic microvascular disease and cerebral atrophy. Electronically Signed   By: Kathreen Devoid   On: 12/16/2014 19:06   Dg Hand Complete Right  12/16/2014  CLINICAL DATA:  Status post fall.  Right hand pain. EXAM:  RIGHT HAND - COMPLETE 3+ VIEW COMPARISON:  None. FINDINGS: No acute fracture or dislocation. Generalized osteopenia. There is fragmentation of the right trapezoid which appears partially resected. There is mild osteoarthritis of the second CMC joint. There is mild osteoarthritis of the first and second MCP joints. There is ulnar minus variance. There is no soft tissue abnormality. IMPRESSION: 1. No acute osseous injury of the right hand. Electronically Signed   By: Kathreen Devoid   On: 12/16/2014 19:09    Microbiology: No results found for this or any previous visit (from the past 240 hour(s)).   Labs: Basic Metabolic Panel:  Recent Labs Lab 12/16/14 2052 12/17/14 0146 12/17/14 2359 12/18/14 2336 12/20/14 0756 12/21/14 0600  NA 142 142 142 138 145 143  K 6.0* 5.6* 5.0 4.0 4.0 4.0  CL 113* 113* 115* 110 114* 115*  CO2 20* 16* 17* 18* 19* 19*  GLUCOSE 106* 121* 97 98 98 97  BUN 49* 47* 34* 28* 24* 19  CREATININE 2.64* 2.48* 2.05* 1.76* 1.71* 1.54*  CALCIUM 9.5 9.5 8.8* 8.3* 9.3 9.4  MG 2.0  --   --   --   --   --    Liver Function Tests:  Recent Labs Lab 12/16/14 2052  AST 22  ALT 13*  ALKPHOS 57  BILITOT 0.2*  PROT 6.8  ALBUMIN 3.6   No results for input(s): LIPASE, AMYLASE in the last 168 hours. No results for input(s): AMMONIA in the last 168 hours. CBC:  Recent Labs Lab 12/16/14 1924 12/17/14 0146 12/21/14 0600  WBC 5.9 8.0 9.3  HGB 8.1*  8.0* 8.1*  HCT 25.7* 25.5* 25.4*  MCV 92.4 93.1 91.7  PLT 198 193 207   Cardiac Enzymes: No results for input(s): CKTOTAL, CKMB, CKMBINDEX, TROPONINI in the last 168 hours. BNP: BNP (last 3 results)  Recent Labs  12/21/14 0910  BNP 627.4*    ProBNP (last 3 results) No results for input(s): PROBNP in the last 8760 hours.  CBG:  Recent Labs Lab 12/20/14 1121 12/20/14 1640 12/20/14 2131 12/21/14 0746 12/21/14 1200  GLUCAP 97 113* 96 76 103*       Signed:  Endia Moncur  [facility] 12/21/2014, 5:10 PM

## 2014-12-21 NOTE — Progress Notes (Signed)
Stacey Zamora to be D/C'd Home per MD order.  Discussed prescriptions and follow up appointments with the patient. Prescriptions given to patient, medication list explained in detail. Pt verbalized understanding.    Medication List    STOP taking these medications        losartan-hydrochlorothiazide 100-12.5 MG tablet  Commonly known as:  HYZAAR     potassium chloride 10 MEQ tablet  Commonly known as:  K-DUR      TAKE these medications        ADVAIR DISKUS 250-50 MCG/DOSE Aepb  Generic drug:  Fluticasone-Salmeterol  Inhale 1 puff into the lungs 2 (two) times daily.     amitriptyline 50 MG tablet  Commonly known as:  ELAVIL  Take 50 mg by mouth at bedtime.     benzonatate 100 MG capsule  Commonly known as:  TESSALON  Take 1 capsule (100 mg total) by mouth 3 (three) times daily as needed for cough.     BYSTOLIC 10 MG tablet  Generic drug:  nebivolol  Take 10 mg by mouth daily.     docusate sodium 100 MG capsule  Commonly known as:  COLACE  Take 1 capsule (100 mg total) by mouth 2 (two) times daily.     feeding supplement (GLUCERNA SHAKE) Liqd  Take 237 mLs by mouth 3 (three) times daily between meals.     fluticasone 50 MCG/ACT nasal spray  Commonly known as:  FLONASE  Place 2 sprays into the nose daily.     gabapentin 100 MG capsule  Commonly known as:  NEURONTIN  Take 100 mg by mouth 3 (three) times daily.     loratadine 10 MG tablet  Commonly known as:  CLARITIN  Take 10 mg by mouth daily.     magnesium oxide 400 (241.3 MG) MG tablet  Commonly known as:  MAG-OX  Take 1 tablet by mouth daily.     megestrol 400 MG/10ML suspension  Commonly known as:  MEGACE  Take 5 mLs (200 mg total) by mouth daily.     omeprazole 20 MG capsule  Commonly known as:  PRILOSEC  Take 40 mg by mouth daily.     pioglitazone 30 MG tablet  Commonly known as:  ACTOS  Take 30 mg by mouth daily.     traMADol 50 MG tablet  Commonly known as:  ULTRAM  Take 1 tablet (50 mg total)  by mouth every 6 (six) hours as needed.     traZODone 50 MG tablet  Commonly known as:  DESYREL  Take 50 mg by mouth at bedtime as needed for sleep.     VENTOLIN HFA 108 (90 BASE) MCG/ACT inhaler  Generic drug:  albuterol  Inhale 2 puffs into the lungs daily as needed for shortness of breath.        Filed Vitals:   12/21/14 1438  BP: 149/56  Pulse:   Temp:   Resp:     Skin clean, dry and intact without evidence of skin break down, no evidence of skin tears noted. IV catheter discontinued intact. Site without signs and symptoms of complications. Dressing and pressure applied. Pt denies pain at this time. No complaints noted.  An After Visit Summary was printed and given to the patient. Patient escorted via [place], and D/C home via private auto.  Retta Mac BSN, RN

## 2014-12-21 NOTE — Discharge Instructions (Signed)
Acute Kidney Injury °Acute kidney injury is any condition in which there is sudden (acute) damage to the kidneys. Acute kidney injury was previously known as acute kidney failure or acute renal failure. The kidneys are two organs that lie on either side of the spine between the middle of the back and the front of the abdomen. The kidneys: °· Remove wastes and extra water from the blood.   °· Produce important hormones. These help keep bones strong, regulate blood pressure, and help create red blood cells.   °· Balance the fluids and chemicals in the blood and tissues. °A small amount of kidney damage may not cause problems, but a large amount of damage may make it difficult or impossible for the kidneys to work the way they should. Acute kidney injury may develop into long-lasting (chronic) kidney disease. It may also develop into a life-threatening disease called end-stage kidney disease. Acute kidney injury can get worse very quickly, so it should be treated right away. Early treatment may prevent other kidney diseases from developing. °CAUSES  °· A problem with blood flow to the kidneys. This may be caused by:   °¨ Blood loss.   °¨ Heart disease.   °¨ Severe burns.   °¨ Liver disease. °· Direct damage to the kidneys. This may be caused by: °¨ Some medicines.   °¨ A kidney infection.   °¨ Poisoning or consuming toxic substances.   °¨ A surgical wound.   °¨ A blow to the kidney area.   °· A problem with urine flow. This may be caused by:   °¨ Cancer.   °¨ Kidney stones.   °¨ An enlarged prostate. °SIGNS AND SYMPTOMS  °· Swelling (edema) of the legs, ankles, or feet.   °· Tiredness (lethargy).   °· Nausea or vomiting.   °· Confusion.   °· Problems with urination, such as:   °¨ Painful or burning feeling during urination.   °¨ Decreased urine production.   °¨ Frequent accidents in children who are potty trained.   °¨ Bloody urine.   °· Muscle twitches and cramps.   °· Shortness of breath.   °· Seizures.   °· Chest  pain or pressure. °Sometimes, no symptoms are present.  °DIAGNOSIS °Acute kidney injury may be detected and diagnosed by tests, including blood, urine, imaging, or kidney biopsy tests.  °TREATMENT °Treatment of acute kidney injury varies depending on the cause and severity of the kidney damage. In mild cases, no treatment may be needed. The kidneys may heal on their own. If acute kidney injury is more severe, your health care provider will treat the cause of the kidney damage, help the kidneys heal, and prevent complications from occurring. Severe cases may require a procedure to remove toxic wastes from the body (dialysis) or surgery to repair kidney damage. Surgery may involve:  °· Repair of a torn kidney.   °· Removal of an obstruction. °HOME CARE INSTRUCTIONS °· Follow your prescribed diet. °· Take medicines only as directed by your health care provider.  °· Do not take any new medicines (prescription, over-the-counter, or nutritional supplements) unless approved by your health care provider. Many medicines can worsen your kidney damage or may need to have the dose adjusted.   °· Keep all follow-up visits as directed by your health care provider. This is important. °· Observe your condition to make sure you are healing as expected. °SEEK IMMEDIATE MEDICAL CARE IF: °· You are feeling ill or have severe pain in the back or side.   °· Your symptoms return or you have new symptoms. °· You have any symptoms of end-stage kidney disease. These include:   °¨ Persistent itchiness.   °¨   Loss of appetite.   °¨ Headaches.   °¨ Abnormally dark or light skin. °¨ Numbness in the hands or feet.   °¨ Easy bruising.   °¨ Frequent hiccups.   °¨ Menstruation stops.   °· You have a fever. °· You have increased urine production. °· You have pain or bleeding when urinating. °MAKE SURE YOU:  °· Understand these instructions. °· Will watch your condition. °· Will get help right away if you are not doing well or get worse. °  °This  information is not intended to replace advice given to you by your health care provider. Make sure you discuss any questions you have with your health care provider. °  °Document Released: 09/02/2010 Document Revised: 03/10/2014 Document Reviewed: 10/17/2011 °[employer]vier Interactive Patient Education ©2016 [employer] ° °

## 2015-03-15 ENCOUNTER — Emergency Department (HOSPITAL_COMMUNITY): Payer: Medicare Other

## 2015-03-15 ENCOUNTER — Inpatient Hospital Stay (HOSPITAL_COMMUNITY)
Admission: EM | Admit: 2015-03-15 | Discharge: 2015-04-04 | DRG: 853 | Disposition: E | Payer: Medicare Other | Attending: Internal Medicine | Admitting: Internal Medicine

## 2015-03-15 ENCOUNTER — Inpatient Hospital Stay: Admission: AD | Admit: 2015-03-15 | Payer: Self-pay | Source: Ambulatory Visit | Admitting: Internal Medicine

## 2015-03-15 ENCOUNTER — Encounter (HOSPITAL_COMMUNITY): Payer: Self-pay | Admitting: Emergency Medicine

## 2015-03-15 DIAGNOSIS — R6521 Severe sepsis with septic shock: Secondary | ICD-10-CM | POA: Diagnosis present

## 2015-03-15 DIAGNOSIS — Z978 Presence of other specified devices: Secondary | ICD-10-CM

## 2015-03-15 DIAGNOSIS — E876 Hypokalemia: Secondary | ICD-10-CM | POA: Diagnosis present

## 2015-03-15 DIAGNOSIS — J95821 Acute postprocedural respiratory failure: Secondary | ICD-10-CM | POA: Diagnosis not present

## 2015-03-15 DIAGNOSIS — K219 Gastro-esophageal reflux disease without esophagitis: Secondary | ICD-10-CM

## 2015-03-15 DIAGNOSIS — N183 Chronic kidney disease, stage 3 unspecified: Secondary | ICD-10-CM | POA: Diagnosis present

## 2015-03-15 DIAGNOSIS — E875 Hyperkalemia: Secondary | ICD-10-CM

## 2015-03-15 DIAGNOSIS — J9601 Acute respiratory failure with hypoxia: Secondary | ICD-10-CM

## 2015-03-15 DIAGNOSIS — Z79899 Other long term (current) drug therapy: Secondary | ICD-10-CM

## 2015-03-15 DIAGNOSIS — K649 Unspecified hemorrhoids: Secondary | ICD-10-CM | POA: Diagnosis present

## 2015-03-15 DIAGNOSIS — G9340 Encephalopathy, unspecified: Secondary | ICD-10-CM | POA: Insufficient documentation

## 2015-03-15 DIAGNOSIS — T378X5A Adverse effect of other specified systemic anti-infectives and antiparasitics, initial encounter: Secondary | ICD-10-CM | POA: Diagnosis present

## 2015-03-15 DIAGNOSIS — R443 Hallucinations, unspecified: Secondary | ICD-10-CM | POA: Diagnosis present

## 2015-03-15 DIAGNOSIS — I272 Other secondary pulmonary hypertension: Secondary | ICD-10-CM | POA: Diagnosis present

## 2015-03-15 DIAGNOSIS — J189 Pneumonia, unspecified organism: Secondary | ICD-10-CM

## 2015-03-15 DIAGNOSIS — Z419 Encounter for procedure for purposes other than remedying health state, unspecified: Secondary | ICD-10-CM

## 2015-03-15 DIAGNOSIS — N2581 Secondary hyperparathyroidism of renal origin: Secondary | ICD-10-CM | POA: Diagnosis present

## 2015-03-15 DIAGNOSIS — F17290 Nicotine dependence, other tobacco product, uncomplicated: Secondary | ICD-10-CM | POA: Diagnosis present

## 2015-03-15 DIAGNOSIS — K859 Acute pancreatitis without necrosis or infection, unspecified: Secondary | ICD-10-CM

## 2015-03-15 DIAGNOSIS — R0600 Dyspnea, unspecified: Secondary | ICD-10-CM

## 2015-03-15 DIAGNOSIS — Z885 Allergy status to narcotic agent status: Secondary | ICD-10-CM

## 2015-03-15 DIAGNOSIS — Z515 Encounter for palliative care: Secondary | ICD-10-CM | POA: Diagnosis not present

## 2015-03-15 DIAGNOSIS — R591 Generalized enlarged lymph nodes: Secondary | ICD-10-CM | POA: Diagnosis present

## 2015-03-15 DIAGNOSIS — Z66 Do not resuscitate: Secondary | ICD-10-CM | POA: Diagnosis not present

## 2015-03-15 DIAGNOSIS — A419 Sepsis, unspecified organism: Principal | ICD-10-CM | POA: Diagnosis present

## 2015-03-15 DIAGNOSIS — I120 Hypertensive chronic kidney disease with stage 5 chronic kidney disease or end stage renal disease: Secondary | ICD-10-CM | POA: Diagnosis present

## 2015-03-15 DIAGNOSIS — N186 End stage renal disease: Secondary | ICD-10-CM

## 2015-03-15 DIAGNOSIS — Z881 Allergy status to other antibiotic agents status: Secondary | ICD-10-CM

## 2015-03-15 DIAGNOSIS — R634 Abnormal weight loss: Secondary | ICD-10-CM | POA: Diagnosis present

## 2015-03-15 DIAGNOSIS — K5900 Constipation, unspecified: Secondary | ICD-10-CM | POA: Diagnosis present

## 2015-03-15 DIAGNOSIS — Z91013 Allergy to seafood: Secondary | ICD-10-CM

## 2015-03-15 DIAGNOSIS — J9600 Acute respiratory failure, unspecified whether with hypoxia or hypercapnia: Secondary | ICD-10-CM

## 2015-03-15 DIAGNOSIS — Z809 Family history of malignant neoplasm, unspecified: Secondary | ICD-10-CM

## 2015-03-15 DIAGNOSIS — Z01818 Encounter for other preprocedural examination: Secondary | ICD-10-CM

## 2015-03-15 DIAGNOSIS — Z452 Encounter for adjustment and management of vascular access device: Secondary | ICD-10-CM

## 2015-03-15 DIAGNOSIS — N179 Acute kidney failure, unspecified: Secondary | ICD-10-CM

## 2015-03-15 DIAGNOSIS — E860 Dehydration: Secondary | ICD-10-CM | POA: Diagnosis present

## 2015-03-15 DIAGNOSIS — I4891 Unspecified atrial fibrillation: Secondary | ICD-10-CM | POA: Diagnosis not present

## 2015-03-15 DIAGNOSIS — N19 Unspecified kidney failure: Secondary | ICD-10-CM

## 2015-03-15 DIAGNOSIS — Z91018 Allergy to other foods: Secondary | ICD-10-CM

## 2015-03-15 DIAGNOSIS — J45909 Unspecified asthma, uncomplicated: Secondary | ICD-10-CM | POA: Diagnosis present

## 2015-03-15 DIAGNOSIS — E785 Hyperlipidemia, unspecified: Secondary | ICD-10-CM | POA: Diagnosis present

## 2015-03-15 DIAGNOSIS — Z992 Dependence on renal dialysis: Secondary | ICD-10-CM

## 2015-03-15 DIAGNOSIS — J811 Chronic pulmonary edema: Secondary | ICD-10-CM

## 2015-03-15 DIAGNOSIS — F17200 Nicotine dependence, unspecified, uncomplicated: Secondary | ICD-10-CM | POA: Diagnosis present

## 2015-03-15 DIAGNOSIS — K769 Liver disease, unspecified: Secondary | ICD-10-CM | POA: Diagnosis present

## 2015-03-15 DIAGNOSIS — Z09 Encounter for follow-up examination after completed treatment for conditions other than malignant neoplasm: Secondary | ICD-10-CM

## 2015-03-15 DIAGNOSIS — G92 Toxic encephalopathy: Secondary | ICD-10-CM | POA: Diagnosis present

## 2015-03-15 DIAGNOSIS — I9581 Postprocedural hypotension: Secondary | ICD-10-CM | POA: Diagnosis not present

## 2015-03-15 DIAGNOSIS — E119 Type 2 diabetes mellitus without complications: Secondary | ICD-10-CM

## 2015-03-15 DIAGNOSIS — Y95 Nosocomial condition: Secondary | ICD-10-CM | POA: Diagnosis present

## 2015-03-15 DIAGNOSIS — J810 Acute pulmonary edema: Secondary | ICD-10-CM | POA: Diagnosis present

## 2015-03-15 DIAGNOSIS — J90 Pleural effusion, not elsewhere classified: Secondary | ICD-10-CM

## 2015-03-15 DIAGNOSIS — Z853 Personal history of malignant neoplasm of breast: Secondary | ICD-10-CM

## 2015-03-15 DIAGNOSIS — R109 Unspecified abdominal pain: Secondary | ICD-10-CM

## 2015-03-15 DIAGNOSIS — D631 Anemia in chronic kidney disease: Secondary | ICD-10-CM | POA: Diagnosis present

## 2015-03-15 DIAGNOSIS — I10 Essential (primary) hypertension: Secondary | ICD-10-CM | POA: Diagnosis present

## 2015-03-15 DIAGNOSIS — E1122 Type 2 diabetes mellitus with diabetic chronic kidney disease: Secondary | ICD-10-CM | POA: Diagnosis present

## 2015-03-15 DIAGNOSIS — I471 Supraventricular tachycardia: Secondary | ICD-10-CM | POA: Diagnosis present

## 2015-03-15 DIAGNOSIS — Z4659 Encounter for fitting and adjustment of other gastrointestinal appliance and device: Secondary | ICD-10-CM

## 2015-03-15 DIAGNOSIS — E872 Acidosis: Secondary | ICD-10-CM | POA: Diagnosis present

## 2015-03-15 DIAGNOSIS — E871 Hypo-osmolality and hyponatremia: Secondary | ICD-10-CM | POA: Diagnosis not present

## 2015-03-15 DIAGNOSIS — R0602 Shortness of breath: Secondary | ICD-10-CM

## 2015-03-15 DIAGNOSIS — Z833 Family history of diabetes mellitus: Secondary | ICD-10-CM

## 2015-03-15 HISTORY — DX: Hyperlipidemia, unspecified: E78.5

## 2015-03-15 HISTORY — DX: Chronic kidney disease, stage 3 unspecified: N18.30

## 2015-03-15 HISTORY — DX: Chronic kidney disease, stage 3 (moderate): N18.3

## 2015-03-15 LAB — CBC WITH DIFFERENTIAL/PLATELET
BASOS ABS: 0.1 10*3/uL (ref 0.0–0.1)
Basophils Relative: 1 %
EOS ABS: 0.1 10*3/uL (ref 0.0–0.7)
Eosinophils Relative: 1 %
HCT: 30.7 % — ABNORMAL LOW (ref 36.0–46.0)
HEMOGLOBIN: 9.8 g/dL — AB (ref 12.0–15.0)
LYMPHS ABS: 1.9 10*3/uL (ref 0.7–4.0)
Lymphocytes Relative: 17 %
MCH: 28.6 pg (ref 26.0–34.0)
MCHC: 31.9 g/dL (ref 30.0–36.0)
MCV: 89.5 fL (ref 78.0–100.0)
Monocytes Absolute: 0.6 10*3/uL (ref 0.1–1.0)
Monocytes Relative: 5 %
NEUTROS PCT: 76 %
Neutro Abs: 8.5 10*3/uL — ABNORMAL HIGH (ref 1.7–7.7)
Platelets: 279 10*3/uL (ref 150–400)
RBC: 3.43 MIL/uL — AB (ref 3.87–5.11)
RDW: 15.1 % (ref 11.5–15.5)
WBC: 11.1 10*3/uL — AB (ref 4.0–10.5)

## 2015-03-15 LAB — CBG MONITORING, ED
GLUCOSE-CAPILLARY: 97 mg/dL (ref 65–99)
Glucose-Capillary: 72 mg/dL (ref 65–99)
Glucose-Capillary: 78 mg/dL (ref 65–99)

## 2015-03-15 MED ORDER — GUAIFENESIN-DM 100-10 MG/5ML PO SYRP
5.0000 mL | ORAL_SOLUTION | Freq: Four times a day (QID) | ORAL | Status: DC | PRN
  Administered 2015-03-15: 5 mL via ORAL
  Filled 2015-03-15: qty 10

## 2015-03-15 NOTE — ED Notes (Signed)
Pt c/o feeling hot and flushed; family concerned that her Blood Sugar may be dropping; CBG ordered

## 2015-03-15 NOTE — ED Provider Notes (Signed)
CSN: [id number]     Arrival date & time 03/23/2015  1455 History   First MD Initiated Contact with Patient 03/16/2015 2239     Chief Complaint  Patient presents with  . Cough  . Shortness of Breath     Patient is a 80 y.o. female presenting with cough and shortness of breath.  Cough Associated symptoms: shortness of breath   Associated symptoms: no chest pain and no headaches   Shortness of Breath Associated symptoms: cough   Associated symptoms: no abdominal pain, no chest pain and no headaches    patient presents with shortness of breath and fatigue. Has had occasional cough. Saw her primary care doctor Dr. Jeanie Cooks had an x-ray that showed worsening pneumonia. She has been on Levaquin. She took all but the last 2 doses. States it up and making her hallucinate. Reportedly sent in the hospital for an admission. Per patient's family and they were told to go to admission but a new nothing about her. Then came to the ER. Reportedly had blood pressure 98 at the office. Has had pneumonia for the last 2 months. Decreased oral intake. Had pulse ox of 85% in triage.   Past Medical History  Diagnosis Date  . Hypertension   . Asthma   . Shortness of breath   . Cancer ([facility])     right breast  . GERD (gastroesophageal reflux disease)   . Arthritis   . Pneumonia   . Diabetes mellitus    Past Surgical History  Procedure Laterality Date  . Wrist surgery    . Breast surgery    . Abdominal hysterectomy    . Cholecystectomy    . Carpel tunnel     Family History  Problem Relation Age of Onset  . Diabetes Mellitus II Daughter   . Cancer Mother   . Dementia Mother    Social History  Substance Use Topics  . Smoking status: Current Every Day Smoker    Types: E-cigarettes  . Smokeless tobacco: Former Systems developer  . Alcohol Use: No   OB History    No data available     Review of Systems  Constitutional: Positive for appetite change and fatigue.  Respiratory: Positive for cough and shortness of  breath.   Cardiovascular: Negative for chest pain.  Gastrointestinal: Negative for abdominal pain.  Genitourinary: Negative for enuresis.  Musculoskeletal: Negative for back pain.  Skin: Negative for color change.  Neurological: Negative for headaches.  Hematological: Negative for adenopathy.      Allergies  Eggs or egg-derived products; Morphine and related; Vicodin; Banana; Levaquin; and Shellfish allergy  Home Medications   Prior to Admission medications   Medication Sig Start Date End Date Taking? Authorizing Provider  ADVAIR DISKUS 250-50 MCG/DOSE AEPB Inhale 1 puff into the lungs 2 (two) times daily. 11/23/14  Yes Historical Provider, MD  amitriptyline (ELAVIL) 50 MG tablet Take 50 mg by mouth at bedtime.     Yes Historical Provider, MD  benzonatate (TESSALON) 100 MG capsule Take 1 capsule (100 mg total) by mouth 3 (three) times daily as needed for cough. 12/21/14  Yes Maryann Mikhail, DO  BYSTOLIC 10 MG tablet Take 10 mg by mouth daily. 12/07/14  Yes Historical Provider, MD  docusate sodium (COLACE) 100 MG capsule Take 1 capsule (100 mg total) by mouth 2 (two) times daily. Patient taking differently: Take 100 mg by mouth 2 (two) times daily as needed for moderate constipation.  12/21/14  Yes Rendon Mikhail, DO  feeding supplement,  GLUCERNA SHAKE, (GLUCERNA SHAKE) LIQD Take 237 mLs by mouth 3 (three) times daily between meals. 12/21/14  Yes Maryann Mikhail, DO  fluticasone (FLONASE) 50 MCG/ACT nasal spray Place 2 sprays into the nose daily as needed for allergies.    Yes Historical Provider, MD  gabapentin (NEURONTIN) 100 MG capsule Take 100 mg by mouth 3 (three) times daily. 11/23/14  Yes Historical Provider, MD  hydrOXYzine (ATARAX/VISTARIL) 25 MG tablet Take 25 mg by mouth daily as needed. Itching, anxiety 02/15/15  Yes Historical Provider, MD  ipratropium (ATROVENT) 0.03 % nasal spray Place 1 spray into both nostrils daily as needed. allergies 02/24/15  Yes Historical Provider,  MD  loratadine (CLARITIN) 10 MG tablet Take 10 mg by mouth daily.     Yes Historical Provider, MD  losartan-hydrochlorothiazide (HYZAAR) 100-12.5 MG tablet Take 1 tablet by mouth daily. 02/24/15  Yes Historical Provider, MD  magnesium oxide (MAG-OX) 400 (241.3 MG) MG tablet Take 1 tablet by mouth daily. 11/23/14  Yes Historical Provider, MD  megestrol (MEGACE) 400 MG/10ML suspension Take 5 mLs (200 mg total) by mouth daily. 12/21/14  Yes Maryann Mikhail, DO  omeprazole (PRILOSEC) 20 MG capsule Take 40 mg by mouth daily.     Yes Historical Provider, MD  oxyCODONE-acetaminophen (PERCOCET/ROXICET) 5-325 MG tablet Take 1 tablet by mouth every 4 (four) hours as needed. pain 01/23/15  Yes Historical Provider, MD  pioglitazone (ACTOS) 30 MG tablet Take 30 mg by mouth daily. 11/01/14  Yes Historical Provider, MD  potassium chloride (K-DUR) 10 MEQ tablet Take 10 mEq by mouth daily. 12/28/14  Yes Historical Provider, MD  simvastatin (ZOCOR) 20 MG tablet Take 20 mg by mouth daily. 02/21/15  Yes Historical Provider, MD  traMADol (ULTRAM) 50 MG tablet Take 1 tablet (50 mg total) by mouth every 6 (six) hours as needed. Patient taking differently: Take 50 mg by mouth every 6 (six) hours as needed for moderate pain.  09/25/13  Yes Gregor Hams, MD  traZODone (DESYREL) 50 MG tablet Take 50 mg by mouth at bedtime as needed for sleep.  11/23/14  Yes Historical Provider, MD  VENTOLIN HFA 108 (90 BASE) MCG/ACT inhaler Inhale 2 puffs into the lungs every 4 (four) hours as needed for wheezing or shortness of breath.  11/23/14  Yes Historical Provider, MD  levofloxacin (LEVAQUIN) 500 MG tablet Take 500 mg by mouth daily. Reported on 03/26/2015 03/07/15   Historical Provider, MD   BP 103/42 mmHg  Pulse 77  Temp(Src) 97.4 F (36.3 C) (Oral)  Resp 21  Ht 5\' 2"  (1.575 m)  Wt 172 lb (78.019 kg)  BMI 31.45 kg/m2  SpO2 95% Physical Exam  Constitutional: She appears well-developed.  HENT:  Head: Atraumatic.  Mucus membranes  are dry.  Neck: Neck supple.  Cardiovascular: Normal rate.   Pulmonary/Chest:  Harsh breath sounds on right. Mild tachypnea.  Abdominal: Soft. There is no tenderness.  Musculoskeletal: Normal range of motion. She exhibits no edema.  Neurological: She is alert.  Skin: Skin is warm.    ED Course  Procedures (including critical care time) Labs Review Labs Reviewed  COMPREHENSIVE METABOLIC PANEL - Abnormal; Notable for the following:    Potassium 6.9 (*)    Chloride 112 (*)    CO2 13 (*)    Glucose, Bld 105 (*)    BUN 66 (*)    Creatinine, Ser 6.41 (*)    ALT 11 (*)    GFR calc non Af Amer 6 (*)    GFR  calc Af Amer 6 (*)    Anion gap 18 (*)    All other components within normal limits  CBC WITH DIFFERENTIAL/PLATELET - Abnormal; Notable for the following:    WBC 11.1 (*)    RBC 3.43 (*)    Hemoglobin 9.8 (*)    HCT 30.7 (*)    Neutro Abs 8.5 (*)    All other components within normal limits  CBG MONITORING, ED  CBG MONITORING, ED  CBG MONITORING, ED    Imaging Review Dg Chest 2 View  04/02/2015  CLINICAL DATA:  Productive cough for 3 months. EXAM: CHEST - 2 VIEW COMPARISON:  Two-view chest x-ray 12/17/2014. FINDINGS: The heart is mildly enlarged. Right lower and likely middle lobe airspace disease is present. Interstitial and airspace opacities extend into the right upper lobe. The left lung is clear apart from minimal left basilar atelectasis. The visualized soft tissues and bony thorax are unremarkable. IMPRESSION: 1. Progressive right lower lobe and middle lobe airspace disease compatible with pneumonia. 2. Interstitial and airspace disease in the right upper lobe compatible with infection or edema. 3. Cardiomegaly. 4. Minimal atelectasis at the left base. Electronically Signed   By: San Morelle M.D.   On: 03/04/2015 15:47   I have personally reviewed and evaluated these images and lab results as part of my medical decision-making.   EKG  Interpretation   Date/Time:  Thursday March 15 2015 15:30:17 EST Ventricular Rate:  76 PR Interval:  153 QRS Duration: 95 QT Interval:  380 QTC Calculation: 427 R Axis:   5 Text Interpretation:  Sinus rhythm Borderline low voltage, extremity leads  Confirmed by Alvino Chapel  MD, Ovid Curd [phone]) on 04/01/2015 11:58:34 PM Also  confirmed by Alvino Chapel  MD, Ovid Curd [phone])  on 03/16/2015 12:16:17 AM      MDM   Final diagnoses:  Hyperkalemia  Renal failure  HCAP (healthcare-associated pneumonia)    Patient with cough and shortness of breath. Has really had pneumonia for the last couple months. Has been on Levaquin. Now has acute on chronic renal failure also. Potassium is 6.5 but reassuring EKG. Will admit to hospitalist. Given insulin and glucose, bicarbonate and albuterol.  CRITICAL CARE Performed by: Mackie Pai Total critical care time: 30 minutes Critical care time was exclusive of separately billable procedures and treating other patients. Critical care was necessary to treat or prevent imminent or life-threatening deterioration. Critical care was time spent personally by me on the following activities: development of treatment plan with patient and/or surrogate as well as nursing, discussions with consultants, evaluation of patient's response to treatment, examination of patient, obtaining history from patient or surrogate, ordering and performing treatments and interventions, ordering and review of laboratory studies, ordering and review of radiographic studies, pulse oximetry and re-evaluation of patient's condition.    Davonna Belling, MD 03/16/15 [phone]

## 2015-03-15 NOTE — ED Notes (Signed)
Patient states she has had a productive cough for over 3 months (phlegm is light brown in color). Patient been diagnosed with pneumonia, patient took all but 2 of her antibiotic pills because they were "making her hallucinate." Patient was sent here by her PCP "to be admitted" Patient's blood pressure at her PCP was 98/62

## 2015-03-15 NOTE — ED Notes (Signed)
RN Minna Merritts attempting IV insertion

## 2015-03-16 ENCOUNTER — Inpatient Hospital Stay (HOSPITAL_COMMUNITY): Payer: Medicare Other

## 2015-03-16 ENCOUNTER — Encounter (HOSPITAL_COMMUNITY): Payer: Self-pay | Admitting: Internal Medicine

## 2015-03-16 DIAGNOSIS — F17290 Nicotine dependence, other tobacco product, uncomplicated: Secondary | ICD-10-CM | POA: Diagnosis present

## 2015-03-16 DIAGNOSIS — E785 Hyperlipidemia, unspecified: Secondary | ICD-10-CM | POA: Diagnosis present

## 2015-03-16 DIAGNOSIS — Z91013 Allergy to seafood: Secondary | ICD-10-CM | POA: Diagnosis not present

## 2015-03-16 DIAGNOSIS — I4891 Unspecified atrial fibrillation: Secondary | ICD-10-CM | POA: Diagnosis not present

## 2015-03-16 DIAGNOSIS — J95821 Acute postprocedural respiratory failure: Secondary | ICD-10-CM | POA: Diagnosis not present

## 2015-03-16 DIAGNOSIS — I471 Supraventricular tachycardia: Secondary | ICD-10-CM | POA: Diagnosis present

## 2015-03-16 DIAGNOSIS — J189 Pneumonia, unspecified organism: Secondary | ICD-10-CM | POA: Diagnosis present

## 2015-03-16 DIAGNOSIS — A419 Sepsis, unspecified organism: Secondary | ICD-10-CM | POA: Diagnosis present

## 2015-03-16 DIAGNOSIS — N186 End stage renal disease: Secondary | ICD-10-CM | POA: Diagnosis not present

## 2015-03-16 DIAGNOSIS — J9602 Acute respiratory failure with hypercapnia: Secondary | ICD-10-CM | POA: Diagnosis not present

## 2015-03-16 DIAGNOSIS — N179 Acute kidney failure, unspecified: Secondary | ICD-10-CM | POA: Diagnosis present

## 2015-03-16 DIAGNOSIS — I120 Hypertensive chronic kidney disease with stage 5 chronic kidney disease or end stage renal disease: Secondary | ICD-10-CM | POA: Diagnosis present

## 2015-03-16 DIAGNOSIS — K219 Gastro-esophageal reflux disease without esophagitis: Secondary | ICD-10-CM | POA: Diagnosis present

## 2015-03-16 DIAGNOSIS — Z833 Family history of diabetes mellitus: Secondary | ICD-10-CM | POA: Diagnosis not present

## 2015-03-16 DIAGNOSIS — I9581 Postprocedural hypotension: Secondary | ICD-10-CM | POA: Diagnosis not present

## 2015-03-16 DIAGNOSIS — Z515 Encounter for palliative care: Secondary | ICD-10-CM | POA: Diagnosis not present

## 2015-03-16 DIAGNOSIS — E871 Hypo-osmolality and hyponatremia: Secondary | ICD-10-CM | POA: Diagnosis not present

## 2015-03-16 DIAGNOSIS — Z809 Family history of malignant neoplasm, unspecified: Secondary | ICD-10-CM | POA: Diagnosis not present

## 2015-03-16 DIAGNOSIS — I10 Essential (primary) hypertension: Secondary | ICD-10-CM

## 2015-03-16 DIAGNOSIS — Z853 Personal history of malignant neoplasm of breast: Secondary | ICD-10-CM | POA: Diagnosis not present

## 2015-03-16 DIAGNOSIS — R591 Generalized enlarged lymph nodes: Secondary | ICD-10-CM | POA: Diagnosis present

## 2015-03-16 DIAGNOSIS — K769 Liver disease, unspecified: Secondary | ICD-10-CM | POA: Diagnosis present

## 2015-03-16 DIAGNOSIS — E119 Type 2 diabetes mellitus without complications: Secondary | ICD-10-CM | POA: Diagnosis not present

## 2015-03-16 DIAGNOSIS — K859 Acute pancreatitis without necrosis or infection, unspecified: Secondary | ICD-10-CM | POA: Diagnosis present

## 2015-03-16 DIAGNOSIS — E860 Dehydration: Secondary | ICD-10-CM | POA: Diagnosis present

## 2015-03-16 DIAGNOSIS — Z881 Allergy status to other antibiotic agents status: Secondary | ICD-10-CM | POA: Diagnosis not present

## 2015-03-16 DIAGNOSIS — R6521 Severe sepsis with septic shock: Secondary | ICD-10-CM | POA: Diagnosis present

## 2015-03-16 DIAGNOSIS — J45909 Unspecified asthma, uncomplicated: Secondary | ICD-10-CM | POA: Diagnosis present

## 2015-03-16 DIAGNOSIS — E875 Hyperkalemia: Secondary | ICD-10-CM | POA: Diagnosis present

## 2015-03-16 DIAGNOSIS — Z91018 Allergy to other foods: Secondary | ICD-10-CM | POA: Diagnosis not present

## 2015-03-16 DIAGNOSIS — R634 Abnormal weight loss: Secondary | ICD-10-CM | POA: Diagnosis present

## 2015-03-16 DIAGNOSIS — Z79899 Other long term (current) drug therapy: Secondary | ICD-10-CM | POA: Diagnosis not present

## 2015-03-16 DIAGNOSIS — Z66 Do not resuscitate: Secondary | ICD-10-CM | POA: Diagnosis not present

## 2015-03-16 DIAGNOSIS — E872 Acidosis: Secondary | ICD-10-CM | POA: Diagnosis present

## 2015-03-16 DIAGNOSIS — E1122 Type 2 diabetes mellitus with diabetic chronic kidney disease: Secondary | ICD-10-CM | POA: Diagnosis present

## 2015-03-16 DIAGNOSIS — D631 Anemia in chronic kidney disease: Secondary | ICD-10-CM | POA: Diagnosis present

## 2015-03-16 DIAGNOSIS — I272 Other secondary pulmonary hypertension: Secondary | ICD-10-CM | POA: Diagnosis present

## 2015-03-16 DIAGNOSIS — Y95 Nosocomial condition: Secondary | ICD-10-CM | POA: Diagnosis present

## 2015-03-16 DIAGNOSIS — J810 Acute pulmonary edema: Secondary | ICD-10-CM | POA: Diagnosis not present

## 2015-03-16 DIAGNOSIS — G9340 Encephalopathy, unspecified: Secondary | ICD-10-CM | POA: Diagnosis not present

## 2015-03-16 DIAGNOSIS — N2581 Secondary hyperparathyroidism of renal origin: Secondary | ICD-10-CM | POA: Diagnosis present

## 2015-03-16 DIAGNOSIS — G92 Toxic encephalopathy: Secondary | ICD-10-CM | POA: Diagnosis present

## 2015-03-16 DIAGNOSIS — J9601 Acute respiratory failure with hypoxia: Secondary | ICD-10-CM | POA: Diagnosis not present

## 2015-03-16 DIAGNOSIS — Z885 Allergy status to narcotic agent status: Secondary | ICD-10-CM | POA: Diagnosis not present

## 2015-03-16 DIAGNOSIS — N183 Chronic kidney disease, stage 3 (moderate): Secondary | ICD-10-CM

## 2015-03-16 DIAGNOSIS — T378X5A Adverse effect of other specified systemic anti-infectives and antiparasitics, initial encounter: Secondary | ICD-10-CM | POA: Diagnosis present

## 2015-03-16 DIAGNOSIS — K5900 Constipation, unspecified: Secondary | ICD-10-CM

## 2015-03-16 DIAGNOSIS — F17200 Nicotine dependence, unspecified, uncomplicated: Secondary | ICD-10-CM | POA: Diagnosis present

## 2015-03-16 DIAGNOSIS — E876 Hypokalemia: Secondary | ICD-10-CM | POA: Diagnosis present

## 2015-03-16 DIAGNOSIS — K649 Unspecified hemorrhoids: Secondary | ICD-10-CM | POA: Diagnosis present

## 2015-03-16 DIAGNOSIS — R443 Hallucinations, unspecified: Secondary | ICD-10-CM | POA: Diagnosis present

## 2015-03-16 LAB — GLUCOSE, CAPILLARY
GLUCOSE-CAPILLARY: 110 mg/dL — AB (ref 65–99)
GLUCOSE-CAPILLARY: 138 mg/dL — AB (ref 65–99)

## 2015-03-16 LAB — COMPREHENSIVE METABOLIC PANEL
ALBUMIN: 3.6 g/dL (ref 3.5–5.0)
ALT: 11 U/L — ABNORMAL LOW (ref 14–54)
ANION GAP: 18 — AB (ref 5–15)
AST: 31 U/L (ref 15–41)
Alkaline Phosphatase: 52 U/L (ref 38–126)
BUN: 66 mg/dL — ABNORMAL HIGH (ref 6–20)
CHLORIDE: 112 mmol/L — AB (ref 101–111)
CO2: 13 mmol/L — AB (ref 22–32)
Calcium: 9.5 mg/dL (ref 8.9–10.3)
Creatinine, Ser: 6.41 mg/dL — ABNORMAL HIGH (ref 0.44–1.00)
GFR calc non Af Amer: 6 mL/min — ABNORMAL LOW (ref >60)
GFR, EST AFRICAN AMERICAN: 6 mL/min — AB (ref >60)
Glucose, Bld: 105 mg/dL — ABNORMAL HIGH (ref 65–99)
POTASSIUM: 6.9 mmol/L — AB (ref 3.5–5.1)
SODIUM: 143 mmol/L (ref 135–145)
Total Bilirubin: 0.7 mg/dL (ref 0.3–1.2)
Total Protein: 7.3 g/dL (ref 6.5–8.1)

## 2015-03-16 LAB — BASIC METABOLIC PANEL
ANION GAP: 14 (ref 5–15)
BUN: 64 mg/dL — AB (ref 6–20)
CO2: 16 mmol/L — AB (ref 22–32)
Calcium: 8.7 mg/dL — ABNORMAL LOW (ref 8.9–10.3)
Chloride: 117 mmol/L — ABNORMAL HIGH (ref 101–111)
Creatinine, Ser: 5.98 mg/dL — ABNORMAL HIGH (ref 0.44–1.00)
GFR calc Af Amer: 7 mL/min — ABNORMAL LOW (ref >60)
GFR, EST NON AFRICAN AMERICAN: 6 mL/min — AB (ref >60)
GLUCOSE: 139 mg/dL — AB (ref 65–99)
POTASSIUM: 5.3 mmol/L — AB (ref 3.5–5.1)
Sodium: 147 mmol/L — ABNORMAL HIGH (ref 135–145)

## 2015-03-16 LAB — PROTIME-INR
INR: 1.26 (ref 0.00–1.49)
PROTHROMBIN TIME: 15.9 s — AB (ref 11.6–15.2)

## 2015-03-16 LAB — LACTIC ACID, PLASMA
Lactic Acid, Venous: 2.7 mmol/L (ref 0.5–2.0)
Lactic Acid, Venous: 2.4 mmol/L (ref 0.5–2.0)
Lactic Acid, Venous: 2.5 mmol/L (ref 0.5–2.0)

## 2015-03-16 LAB — CBG MONITORING, ED
GLUCOSE-CAPILLARY: 85 mg/dL (ref 65–99)
GLUCOSE-CAPILLARY: 119 mg/dL — AB (ref 65–99)

## 2015-03-16 LAB — PROCALCITONIN: PROCALCITONIN: 3.69 ng/mL

## 2015-03-16 LAB — APTT: aPTT: 29 seconds (ref 24–37)

## 2015-03-16 MED ORDER — INSULIN ASPART 100 UNIT/ML IV SOLN
4.0000 [IU] | Freq: Once | INTRAVENOUS | Status: AC
  Administered 2015-03-16: 4 [IU] via INTRAVENOUS
  Filled 2015-03-16: qty 0.04

## 2015-03-16 MED ORDER — LORATADINE 10 MG PO TABS
5.0000 mg | ORAL_TABLET | Freq: Every day | ORAL | Status: DC
  Filled 2015-03-16: qty 0.5

## 2015-03-16 MED ORDER — ALBUTEROL SULFATE (2.5 MG/3ML) 0.083% IN NEBU
INHALATION_SOLUTION | RESPIRATORY_TRACT | Status: AC
  Filled 2015-03-16: qty 3

## 2015-03-16 MED ORDER — SODIUM CHLORIDE 0.9 % IV BOLUS (SEPSIS)
500.0000 mL | Freq: Once | INTRAVENOUS | Status: AC
  Administered 2015-03-16: 500 mL via INTRAVENOUS

## 2015-03-16 MED ORDER — PIPERACILLIN-TAZOBACTAM IN DEX 2-0.25 GM/50ML IV SOLN
2.2500 g | Freq: Four times a day (QID) | INTRAVENOUS | Status: DC
  Administered 2015-03-16: 2.25 g via INTRAVENOUS
  Filled 2015-03-16 (×2): qty 50

## 2015-03-16 MED ORDER — DOCUSATE SODIUM 100 MG PO CAPS: 100.0000 mg | ORAL_CAPSULE | Freq: Two times a day (BID) | ORAL | Status: DC | PRN

## 2015-03-16 MED ORDER — SIMVASTATIN 10 MG PO TABS
20.0000 mg | ORAL_TABLET | Freq: Every day | ORAL | Status: DC
  Administered 2015-03-16: 20 mg via ORAL
  Filled 2015-03-16 (×3): qty 1

## 2015-03-16 MED ORDER — SODIUM CHLORIDE 0.9 % IV BOLUS (SEPSIS): 500.0000 mL | Freq: Once | INTRAVENOUS | Status: DC

## 2015-03-16 MED ORDER — SODIUM CHLORIDE 0.9 % IV BOLUS (SEPSIS)
1000.0000 mL | Freq: Once | INTRAVENOUS | Status: AC
Start: 2015-03-16 — End: 2015-03-16
  Administered 2015-03-16: 1000 mL via INTRAVENOUS

## 2015-03-16 MED ORDER — HEPARIN SODIUM (PORCINE) 5000 UNIT/ML IJ SOLN
5000.0000 [IU] | Freq: Three times a day (TID) | INTRAMUSCULAR | Status: DC
  Administered 2015-03-16 – 2015-03-17 (×4): 5000 [IU] via SUBCUTANEOUS
  Filled 2015-03-16 (×5): qty 1

## 2015-03-16 MED ORDER — ALBUTEROL SULFATE (2.5 MG/3ML) 0.083% IN NEBU
2.5000 mg | INHALATION_SOLUTION | RESPIRATORY_TRACT | Status: DC | PRN
  Administered 2015-03-18: 2.5 mg via RESPIRATORY_TRACT
  Filled 2015-03-16 (×2): qty 3

## 2015-03-16 MED ORDER — SODIUM CHLORIDE 0.9 % IV SOLN
INTRAVENOUS | Status: AC
  Administered 2015-03-16: 04:00:00 via INTRAVENOUS

## 2015-03-16 MED ORDER — SODIUM BICARBONATE 8.4 % IV SOLN
50.0000 meq | Freq: Once | INTRAVENOUS | Status: AC
  Administered 2015-03-16: 50 meq via INTRAVENOUS
  Filled 2015-03-16: qty 50

## 2015-03-16 MED ORDER — DEXTROSE-NACL 5-0.2 % IV SOLN
INTRAVENOUS | Status: DC
  Administered 2015-03-16: 19:00:00 via INTRAVENOUS

## 2015-03-16 MED ORDER — VANCOMYCIN HCL IN DEXTROSE 1-5 GM/200ML-% IV SOLN
1000.0000 mg | Freq: Once | INTRAVENOUS | Status: AC
  Administered 2015-03-16: 1000 mg via INTRAVENOUS
  Filled 2015-03-16: qty 200

## 2015-03-16 MED ORDER — PIPERACILLIN-TAZOBACTAM IN DEX 2-0.25 GM/50ML IV SOLN
2.2500 g | Freq: Three times a day (TID) | INTRAVENOUS | Status: DC
Start: 2015-03-16 — End: 2015-03-24
  Administered 2015-03-16 – 2015-03-24 (×25): 2.25 g via INTRAVENOUS
  Filled 2015-03-16 (×31): qty 50

## 2015-03-16 MED ORDER — POLYETHYLENE GLYCOL 3350 17 G PO PACK
17.0000 g | PACK | Freq: Every day | ORAL | Status: DC
  Filled 2015-03-16 (×2): qty 1

## 2015-03-16 MED ORDER — TRAZODONE HCL 50 MG PO TABS
50.0000 mg | ORAL_TABLET | Freq: Every evening | ORAL | Status: DC | PRN
  Administered 2015-03-16: 50 mg via ORAL
  Filled 2015-03-16: qty 1

## 2015-03-16 MED ORDER — MEGESTROL ACETATE 400 MG/10ML PO SUSP
200.0000 mg | Freq: Every day | ORAL | Status: DC
  Administered 2015-03-16: 200 mg via ORAL
  Filled 2015-03-16 (×2): qty 5

## 2015-03-16 MED ORDER — OXYCODONE-ACETAMINOPHEN 5-325 MG PO TABS
1.0000 | ORAL_TABLET | ORAL | Status: DC | PRN
  Administered 2015-03-16 (×3): 1 via ORAL
  Filled 2015-03-16 (×3): qty 1

## 2015-03-16 MED ORDER — GLUCERNA SHAKE PO LIQD
237.0000 mL | Freq: Three times a day (TID) | ORAL | Status: DC
  Administered 2015-03-16 (×2): 237 mL via ORAL
  Filled 2015-03-16 (×6): qty 237

## 2015-03-16 MED ORDER — DEXTROSE 50 % IV SOLN
1.0000 | Freq: Once | INTRAVENOUS | Status: AC
  Administered 2015-03-16: 50 mL via INTRAVENOUS
  Filled 2015-03-16: qty 50

## 2015-03-16 MED ORDER — PANTOPRAZOLE SODIUM 40 MG PO TBEC
40.0000 mg | DELAYED_RELEASE_TABLET | Freq: Every day | ORAL | Status: DC
  Administered 2015-03-16: 40 mg via ORAL
  Filled 2015-03-16 (×2): qty 1

## 2015-03-16 MED ORDER — SODIUM CHLORIDE 0.9 % IV SOLN
INTRAVENOUS | Status: DC
  Administered 2015-03-16: 14:00:00 via INTRAVENOUS

## 2015-03-16 MED ORDER — HYDROXYZINE HCL 25 MG PO TABS: 25.0000 mg | ORAL_TABLET | Freq: Every day | ORAL | Status: DC | PRN

## 2015-03-16 MED ORDER — AMITRIPTYLINE HCL 25 MG PO TABS
50.0000 mg | ORAL_TABLET | Freq: Every day | ORAL | Status: DC
  Administered 2015-03-16 (×2): 50 mg via ORAL
  Filled 2015-03-16: qty 2
  Filled 2015-03-16 (×2): qty 1

## 2015-03-16 MED ORDER — FLUTICASONE PROPIONATE 50 MCG/ACT NA SUSP
2.0000 | Freq: Every day | NASAL | Status: DC | PRN
  Filled 2015-03-16: qty 16

## 2015-03-16 MED ORDER — LORATADINE 10 MG PO TABS
10.0000 mg | ORAL_TABLET | Freq: Every day | ORAL | Status: DC
  Administered 2015-03-16: 10 mg via ORAL
  Filled 2015-03-16: qty 1

## 2015-03-16 MED ORDER — DM-GUAIFENESIN ER 30-600 MG PO TB12
1.0000 | ORAL_TABLET | Freq: Two times a day (BID) | ORAL | Status: DC | PRN
  Filled 2015-03-16: qty 1

## 2015-03-16 MED ORDER — SODIUM CHLORIDE 0.9 % IV BOLUS (SEPSIS)
250.0000 mL | Freq: Once | INTRAVENOUS | Status: AC
  Administered 2015-03-16: 250 mL via INTRAVENOUS

## 2015-03-16 MED ORDER — NEBIVOLOL HCL 10 MG PO TABS
10.0000 mg | ORAL_TABLET | Freq: Every day | ORAL | Status: DC
  Filled 2015-03-16 (×2): qty 1

## 2015-03-16 MED ORDER — SODIUM POLYSTYRENE SULFONATE 15 GM/60ML PO SUSP
45.0000 g | Freq: Once | ORAL | Status: AC
  Administered 2015-03-16: 45 g via ORAL

## 2015-03-16 MED ORDER — SODIUM CHLORIDE 0.9 % IV BOLUS (SEPSIS)
1000.0000 mL | Freq: Once | INTRAVENOUS | Status: AC
  Administered 2015-03-16: 1000 mL via INTRAVENOUS

## 2015-03-16 MED ORDER — IPRATROPIUM-ALBUTEROL 0.5-2.5 (3) MG/3ML IN SOLN
3.0000 mL | RESPIRATORY_TRACT | Status: DC
  Administered 2015-03-16 – 2015-03-17 (×8): 3 mL via RESPIRATORY_TRACT
  Filled 2015-03-16 (×8): qty 3

## 2015-03-16 MED ORDER — MAGNESIUM OXIDE 400 (241.3 MG) MG PO TABS
400.0000 mg | ORAL_TABLET | Freq: Every day | ORAL | Status: DC
  Administered 2015-03-16: 400 mg via ORAL
  Filled 2015-03-16 (×2): qty 1

## 2015-03-16 MED ORDER — GABAPENTIN 100 MG PO CAPS
100.0000 mg | ORAL_CAPSULE | Freq: Three times a day (TID) | ORAL | Status: DC
  Administered 2015-03-16 (×2): 100 mg via ORAL
  Filled 2015-03-16 (×5): qty 1

## 2015-03-16 MED ORDER — INSULIN ASPART 100 UNIT/ML ~~LOC~~ SOLN: 0.0000 [IU] | Freq: Three times a day (TID) | SUBCUTANEOUS | Status: DC

## 2015-03-16 MED ORDER — ALBUTEROL SULFATE (2.5 MG/3ML) 0.083% IN NEBU
10.0000 mg | INHALATION_SOLUTION | Freq: Once | RESPIRATORY_TRACT | Status: AC
  Administered 2015-03-16: 10 mg via RESPIRATORY_TRACT
  Filled 2015-03-16: qty 12

## 2015-03-16 NOTE — Progress Notes (Signed)
Utilization Review completed.  Ahnya Akre RN CM  

## 2015-03-16 NOTE — Progress Notes (Signed)
CRITICAL VALUE ALERT  Critical value received:  Lactic Acid 2.5  Date of notification:  03/16/2015  Time of notification:  2245  Critical value read back:Yes.    Nurse who received alert:  Raynelle Jan  MD notified (1st page):  Fredirick Maudlin, NP  Time of first page:  2303  MD notified (2nd page):  Time of second page:  Responding MD:  Fredirick Maudlin, NP  Time MD responded:  [phone]

## 2015-03-16 NOTE — ED Notes (Signed)
Pt had four large episodes of diarrhea.

## 2015-03-16 NOTE — ED Notes (Signed)
Pt c/o headache

## 2015-03-16 NOTE — ED Notes (Signed)
Ultrasound at bedside

## 2015-03-16 NOTE — Progress Notes (Signed)
ANTIBIOTIC CONSULT NOTE - INITIAL  Pharmacy Consult for Zosyn/Vancomycin Indication: HCAP  Allergies  Allergen Reactions  . Eggs Or Egg-Derived Products Hives  . Morphine And Related Itching and Other (See Comments)    Crawling sensations  . Vicodin [Hydrocodone-Acetaminophen] Itching and Other (See Comments)    Crawling sensations  . Banana Other (See Comments)    Pt does not eat bananas  . Levaquin [Levofloxacin] Other (See Comments)    Hallucinations: Pt was to take 10 day course, but began to hallucinate after day 8.  . Shellfish Allergy Other (See Comments)    Pt doesn't eat SeaFood at all. No Fish, no Shrimp, etc    Patient Measurements: Height: 5\' 2"  (157.5 cm) Weight: 172 lb (78.019 kg) IBW/kg (Calculated) : 50.1   Vital Signs: Temp: 97.4 F (36.3 C) (01/12 1512) Temp Source: Oral (01/12 1512) BP: 103/42 mmHg (01/12 2330) Pulse Rate: 77 (01/12 2330) Intake/Output from previous day:   Intake/Output from this shift:    Labs:  Recent Labs  03/29/2015 2321  WBC 11.1*  HGB 9.8*  PLT 279  CREATININE 6.41*   Estimated Creatinine Clearance: 7.1 mL/min (by C-G formula based on Cr of 6.41). No results for input(s): VANCOTROUGH, VANCOPEAK, VANCORANDOM, GENTTROUGH, GENTPEAK, GENTRANDOM, TOBRATROUGH, TOBRAPEAK, TOBRARND, AMIKACINPEAK, AMIKACINTROU, AMIKACIN in the last 72 hours.   Microbiology: No results found for this or any previous visit (from the past 720 hour(s)).  Medical History: Past Medical History  Diagnosis Date  . Hypertension   . Asthma   . Shortness of breath   . Cancer ([place])     right breast  . GERD (gastroesophageal reflux disease)   . Arthritis   . Pneumonia   . Diabetes mellitus     Medications:   (Not in a hospital admission) Scheduled:   Infusions:  . piperacillin-tazobactam (ZOSYN)  IV    . sodium chloride 1,000 mL (03/16/15 0035)  . vancomycin     Assessment: 5 yoF presenting with cough and SOB.  Zosyn/Vancomycin per Rx for  HCAP.   Goal of Therapy:  Vancomycin trough level 15-20 mcg/ml  Plan:   Zosyn 2.25 Gm IV q8h  Vancomycin 1Gm x1 then check Random vanc level on 1/15 ~ 48 hrs   F/u Scr/cutures/levels  Dorrene German 03/16/2015,12:47 AM

## 2015-03-16 NOTE — ED Notes (Signed)
Pt tried using the bedpan but was unsuccessful. Pt is aware a specimen is needed.

## 2015-03-16 NOTE — Progress Notes (Signed)
03/16/15 1850     Resulting lab: SUNQUEST   Reference range: 0.5 - 2.0 mmol/L   Value: 2.4 (High Panic   MD notified.Bonna Gains, RN

## 2015-03-16 NOTE — H&P (Addendum)
[facility] History and Physical  Stacey Zamora [id number] DOB: 09/17/1935 DOA: 03/23/2015  Referring physician: ED physician PCP: Philis Fendt, MD  Specialists:   Chief Complaint: Shortness of breath, productive cough  HPI: Stacey Zamora is a 80 y.o. female with PMH of hypertension, hyperlipidemia, diabetes mellitus, asthma, GERD, depression, breast cancer, arthritis, chronic kidney disease-3, who presents with productive cough and shortness of breath.  Patient reports that he has been having cough and shortness of breath for almost 3 months. She was recently diagnosed and treated for PNA with Levaquin. She states that she took all but 2 of her antibiotic pills because they were "making her hallucinate." She still has productive cough with brownish colored sputum production and shortness of breath. She does not have chest pain. She reports that she is constipated, but no abdominal pain, fever or chills. Patient denies symptoms of UTI. She does not have rashes, unilateral weakness.  In ED, patient was found to have WBC 11.1, potassium 6.9, bicarbonate 13, worsening renal function with creatinine 6.41, BUN 66, temperature normal, no tachycardia. Chest x-ray showed infiltration in right lower lobe and right middle lobe. Patient's admitted to inpatient for further evaluation and treatment. Renal was consulted of AED.  EKG: Independently reviewed. QTC 427, poor R-wave progression, low voltage, no T-wave peaking.  Where does patient live?   At home   Can patient participate in ADLs? Little  Review of Systems:   General: no fevers, chills, no changes in body weight, has poor appetite, has fatigue HEENT: no blurry vision, hearing changes or sore throat Pulm: has dyspnea, coughing, wheezing CV: no chest pain, palpitations Abd: no nausea, vomiting, abdominal pain, diarrhea, has constipation GU: no dysuria, burning on urination, increased urinary frequency, hematuria  Ext: no leg  edema Neuro: no unilateral weakness, numbness, or tingling, no vision change or hearing loss Skin: no rash MSK: No muscle spasm, no deformity, no limitation of range of movement in spin Heme: No easy bruising.  Travel history: No recent long distant travel.  Allergy:  Allergies  Allergen Reactions  . Eggs Or Egg-Derived Products Hives  . Morphine And Related Itching and Other (See Comments)    Crawling sensations  . Vicodin [Hydrocodone-Acetaminophen] Itching and Other (See Comments)    Crawling sensations  . Banana Other (See Comments)    Pt does not eat bananas  . Levaquin [Levofloxacin] Other (See Comments)    Hallucinations: Pt was to take 10 day course, but began to hallucinate after day 8.  . Shellfish Allergy Other (See Comments)    Pt doesn't eat SeaFood at all. No Fish, no Shrimp, etc    Past Medical History  Diagnosis Date  . Hypertension   . Asthma   . Shortness of breath   . Cancer ([place])     right breast  . GERD (gastroesophageal reflux disease)   . Arthritis   . Pneumonia   . Diabetes mellitus   . HLD (hyperlipidemia)   . CKD (chronic kidney disease), stage III   . GERD (gastroesophageal reflux disease)     Past Surgical History  Procedure Laterality Date  . Wrist surgery    . Breast surgery    . Abdominal hysterectomy    . Cholecystectomy    . Carpel tunnel      Social History:  reports that she has been smoking E-cigarettes.  She has quit using smokeless tobacco. She reports that she does not drink alcohol or use illicit drugs.  Family History:  Family History  Problem Relation Age of Onset  . Diabetes Mellitus II Daughter   . Cancer Mother   . Dementia Mother      Prior to Admission medications   Medication Sig Start Date End Date Taking? Authorizing Provider  ADVAIR DISKUS 250-50 MCG/DOSE AEPB Inhale 1 puff into the lungs 2 (two) times daily. 11/23/14  Yes Historical Provider, MD  amitriptyline (ELAVIL) 50 MG tablet Take 50 mg by mouth at  bedtime.     Yes Historical Provider, MD  benzonatate (TESSALON) 100 MG capsule Take 1 capsule (100 mg total) by mouth 3 (three) times daily as needed for cough. 12/21/14  Yes Maryann Mikhail, DO  BYSTOLIC 10 MG tablet Take 10 mg by mouth daily. 12/07/14  Yes Historical Provider, MD  docusate sodium (COLACE) 100 MG capsule Take 1 capsule (100 mg total) by mouth 2 (two) times daily. Patient taking differently: Take 100 mg by mouth 2 (two) times daily as needed for moderate constipation.  12/21/14  Yes Maryann Mikhail, DO  feeding supplement, GLUCERNA SHAKE, (GLUCERNA SHAKE) LIQD Take 237 mLs by mouth 3 (three) times daily between meals. 12/21/14  Yes Maryann Mikhail, DO  fluticasone (FLONASE) 50 MCG/ACT nasal spray Place 2 sprays into the nose daily as needed for allergies.    Yes Historical Provider, MD  gabapentin (NEURONTIN) 100 MG capsule Take 100 mg by mouth 3 (three) times daily. 11/23/14  Yes Historical Provider, MD  hydrOXYzine (ATARAX/VISTARIL) 25 MG tablet Take 25 mg by mouth daily as needed. Itching, anxiety 02/15/15  Yes Historical Provider, MD  ipratropium (ATROVENT) 0.03 % nasal spray Place 1 spray into both nostrils daily as needed. allergies 02/24/15  Yes Historical Provider, MD  loratadine (CLARITIN) 10 MG tablet Take 10 mg by mouth daily.     Yes Historical Provider, MD  losartan-hydrochlorothiazide (HYZAAR) 100-12.5 MG tablet Take 1 tablet by mouth daily. 02/24/15  Yes Historical Provider, MD  magnesium oxide (MAG-OX) 400 (241.3 MG) MG tablet Take 1 tablet by mouth daily. 11/23/14  Yes Historical Provider, MD  megestrol (MEGACE) 400 MG/10ML suspension Take 5 mLs (200 mg total) by mouth daily. 12/21/14  Yes Maryann Mikhail, DO  omeprazole (PRILOSEC) 20 MG capsule Take 40 mg by mouth daily.     Yes Historical Provider, MD  oxyCODONE-acetaminophen (PERCOCET/ROXICET) 5-325 MG tablet Take 1 tablet by mouth every 4 (four) hours as needed. pain 01/23/15  Yes Historical Provider, MD   pioglitazone (ACTOS) 30 MG tablet Take 30 mg by mouth daily. 11/01/14  Yes Historical Provider, MD  potassium chloride (K-DUR) 10 MEQ tablet Take 10 mEq by mouth daily. 12/28/14  Yes Historical Provider, MD  simvastatin (ZOCOR) 20 MG tablet Take 20 mg by mouth daily. 02/21/15  Yes Historical Provider, MD  traMADol (ULTRAM) 50 MG tablet Take 1 tablet (50 mg total) by mouth every 6 (six) hours as needed. Patient taking differently: Take 50 mg by mouth every 6 (six) hours as needed for moderate pain.  09/25/13  Yes Gregor Hams, MD  traZODone (DESYREL) 50 MG tablet Take 50 mg by mouth at bedtime as needed for sleep.  11/23/14  Yes Historical Provider, MD  VENTOLIN HFA 108 (90 BASE) MCG/ACT inhaler Inhale 2 puffs into the lungs every 4 (four) hours as needed for wheezing or shortness of breath.  11/23/14  Yes Historical Provider, MD  levofloxacin (LEVAQUIN) 500 MG tablet Take 500 mg by mouth daily. Reported on 03/09/2015 03/07/15   Historical Provider, MD    Physical Exam:  Filed Vitals:   03/09/2015 2325 04/02/2015 2330 03/16/15 0100 03/16/15 0130  BP: 129/58 103/42 115/53 108/58  Pulse: 78 77 87 98  Temp:      TempSrc:      Resp: 21 21 20 19   Height:      Weight:      SpO2: 97% 95% 100% 99%   General: Not in acute distress HEENT:       Eyes: PERRL, EOMI, no scleral icterus.       ENT: No discharge from the ears and nose, no pharynx injection, no tonsillar enlargement.        Neck: No JVD, no bruit, no mass felt. Heme: No neck lymph node enlargement. Cardiac: S1/S2, RRR, No murmurs, No gallops or rubs. Pulm: Has mild wheezing bilaterally. No rales or rubs. Abd: Soft, nondistended, nontender, no rebound pain, no organomegaly, BS present. Ext: No pitting leg edema bilaterally. 2+DP/PT pulse bilaterally. Musculoskeletal: No joint deformities, No joint redness or warmth, no limitation of ROM in spin. Skin: No rashes.  Neuro: Alert, oriented X3, cranial nerves II-XII grossly intact, moves all  extremities Psych: Patient is not psychotic, no suicidal or hemocidal ideation.  Labs on Admission:  Basic Metabolic Panel:  Recent Labs Lab 03/12/2015 2321  NA 143  K 6.9*  CL 112*  CO2 13*  GLUCOSE 105*  BUN 66*  CREATININE 6.41*  CALCIUM 9.5   Liver Function Tests:  Recent Labs Lab 03/09/2015 2321  AST 31  ALT 11*  ALKPHOS 52  BILITOT 0.7  PROT 7.3  ALBUMIN 3.6   No results for input(s): LIPASE, AMYLASE in the last 168 hours. No results for input(s): AMMONIA in the last 168 hours. CBC:  Recent Labs Lab 03/11/2015 2321  WBC 11.1*  NEUTROABS 8.5*  HGB 9.8*  HCT 30.7*  MCV 89.5  PLT 279   Cardiac Enzymes: No results for input(s): CKTOTAL, CKMB, CKMBINDEX, TROPONINI in the last 168 hours.  BNP (last 3 results)  Recent Labs  12/21/14 0910  BNP 627.4*    ProBNP (last 3 results) No results for input(s): PROBNP in the last 8760 hours.  CBG:  Recent Labs Lab 03/08/2015 1710 03/19/2015 2101 03/14/2015 2255  GLUCAP 72 78 97    Radiological Exams on Admission: Dg Chest 2 View  03/05/2015  CLINICAL DATA:  Productive cough for 3 months. EXAM: CHEST - 2 VIEW COMPARISON:  Two-view chest x-ray 12/17/2014. FINDINGS: The heart is mildly enlarged. Right lower and likely middle lobe airspace disease is present. Interstitial and airspace opacities extend into the right upper lobe. The left lung is clear apart from minimal left basilar atelectasis. The visualized soft tissues and bony thorax are unremarkable. IMPRESSION: 1. Progressive right lower lobe and middle lobe airspace disease compatible with pneumonia. 2. Interstitial and airspace disease in the right upper lobe compatible with infection or edema. 3. Cardiomegaly. 4. Minimal atelectasis at the left base. Electronically Signed   By: San Morelle M.D.   On: 03/21/2015 15:47    Assessment/Plan Principal Problem:   HCAP (healthcare-associated pneumonia) Active Problems:   Dehydration   Diabetes type 2,  controlled ([place])   HTN (hypertension)   Hyperkalemia   Sepsis ([place])   Acute renal failure superimposed on stage 3 chronic kidney disease (HCC)   HLD (hyperlipidemia)   GERD (gastroesophageal reflux disease)   Constipation  Sepsis due to HCAP: Patient has pneumonia as evidenced by chest x-ray. She is septic with soft blood pressure, leukocytosis, tachypnea. She is hemodynamically stable on  admission.  - Will admit to Telemetry Bed - IV Vancomycin and Zosyn - Mucinex for cough  - DuoNeb Neb prn for SOB - Urine legionella and S. pneumococcal antigen - Follow up blood culture x2, sputum culture,plus Flu pcr - will get Procalcitonin and trend lactic acid level per sepsis protocol - IVF: 3L of NS bolus in ED, followed by 100 mL per hour of NS  AoCKD-III: Baseline Cre is 1.54, her Cre 6.41 and a BUN 66 is on admission. Likely due to prerenal secondary to dehydration and continuation of ACEI, diruetics. Renal was consulted by EDP - IVF as above - Check FeUrea - US-renal - Follow up renal function by BMP - Hold Hyzaar - Follow up renal recommendations  Hyperkalemia: Potassium 6.9 without EKG change. Most likely due to worsening renal function. -IV fluids as above -Patient was treated with insulin, bicarbonate by EDP -Kayexalate 45 g 1  HTN: Blood pressure is soft -Hold Hyzaar -Continue bystolic  DM-II: Last 123456 6.1, well controled. Patient is taking Actos at home -SSI  HLD: Last LDL was 35 on 02/19/15 -Continue home medications: Zocor  GERD: -Protonix  DVT ppx: SQ Heparin  Code Status: Full code Family Communication: None at bed side.   Disposition Plan: Admit to inpatient   Date of Service 03/16/2015    Ivor Costa [facility] Pager [phone]  If 7PM-7AM, please contact night-coverage [email or web address] Password [email or web address] 03/16/2015, 3:06 AM

## 2015-03-16 NOTE — Consult Note (Signed)
Renal Service Consult Note [facility]  Stacey Zamora 03/16/2015 [place] Requesting Physician:  Dr Broadus [name]  Reason for Consult:  Acute on CRF HPI: The patient is a 80 y.o. year-old with hx of HTN, DM and asthma, R BRCA, PNA, HL and CKD.  She has not been doing well according to the daughter since last Oct when she was admitted for PNA here. She has not been eating well and has had a lot of fatigue. Presents now with prod cough x 3 m os, taking op abx for "PNA" and not getting better.  Sent to ED by PCP. SOB +. CXR showing RLL infilrate which is large.   Patient gives little history, daughter provides hx. No CP, no fevers, no abd pain, no n/v/d.  No abd pain.  K was high in ED at 6.9 with repeat 5.5.  EKG no peaked T's. NSR.    Past Medical History  Past Medical History  Diagnosis Date  . Hypertension   . Asthma   . Shortness of breath   . Cancer ([place])     right breast  . GERD (gastroesophageal reflux disease)   . Arthritis   . Pneumonia   . Diabetes mellitus   . HLD (hyperlipidemia)   . CKD (chronic kidney disease), stage III   . GERD (gastroesophageal reflux disease)    Past Surgical History  Past Surgical History  Procedure Laterality Date  . Wrist surgery    . Breast surgery    . Abdominal hysterectomy    . Cholecystectomy    . Carpel tunnel     Family History  Family History  Problem Relation Age of Onset  . Diabetes Mellitus II Daughter   . Cancer Mother   . Dementia Mother    Social History  reports that she has been smoking E-cigarettes.  She has quit using smokeless tobacco. She reports that she does not drink alcohol or use illicit drugs. Allergies  Allergies  Allergen Reactions  . Eggs Or Egg-Derived Products Hives  . Morphine And Related Itching and Other (See Comments)    Crawling sensations  . Vicodin [Hydrocodone-Acetaminophen] Itching and Other (See Comments)    Crawling sensations  . Banana Other (See Comments)    Pt does  not eat bananas  . Levaquin [Levofloxacin] Other (See Comments)    Hallucinations: Pt was to take 10 day course, but began to hallucinate after day 8.  . Shellfish Allergy Other (See Comments)    Pt doesn't eat SeaFood at all. No Fish, no Shrimp, etc   Home medications Prior to Admission medications   Medication Sig Start Date End Date Taking? Authorizing Provider  ADVAIR DISKUS 250-50 MCG/DOSE AEPB Inhale 1 puff into the lungs 2 (two) times daily. 11/23/14  Yes Historical Provider, MD  amitriptyline (ELAVIL) 50 MG tablet Take 50 mg by mouth at bedtime.     Yes Historical Provider, MD  benzonatate (TESSALON) 100 MG capsule Take 1 capsule (100 mg total) by mouth 3 (three) times daily as needed for cough. 12/21/14  Yes Maryann Mikhail, DO  BYSTOLIC 10 MG tablet Take 10 mg by mouth daily. 12/07/14  Yes Historical Provider, MD  docusate sodium (COLACE) 100 MG capsule Take 1 capsule (100 mg total) by mouth 2 (two) times daily. Patient taking differently: Take 100 mg by mouth 2 (two) times daily as needed for moderate constipation.  12/21/14  Yes Maryann Mikhail, DO  feeding supplement, GLUCERNA SHAKE, (GLUCERNA SHAKE) LIQD Take 237 mLs by  mouth 3 (three) times daily between meals. 12/21/14  Yes Maryann Mikhail, DO  fluticasone (FLONASE) 50 MCG/ACT nasal spray Place 2 sprays into the nose daily as needed for allergies.    Yes Historical Provider, MD  gabapentin (NEURONTIN) 100 MG capsule Take 100 mg by mouth 3 (three) times daily. 11/23/14  Yes Historical Provider, MD  hydrOXYzine (ATARAX/VISTARIL) 25 MG tablet Take 25 mg by mouth daily as needed. Itching, anxiety 02/15/15  Yes Historical Provider, MD  ipratropium (ATROVENT) 0.03 % nasal spray Place 1 spray into both nostrils daily as needed. allergies 02/24/15  Yes Historical Provider, MD  loratadine (CLARITIN) 10 MG tablet Take 10 mg by mouth daily.     Yes Historical Provider, MD  losartan-hydrochlorothiazide (HYZAAR) 100-12.5 MG tablet Take 1 tablet  by mouth daily. 02/24/15  Yes Historical Provider, MD  magnesium oxide (MAG-OX) 400 (241.3 MG) MG tablet Take 1 tablet by mouth daily. 11/23/14  Yes Historical Provider, MD  megestrol (MEGACE) 400 MG/10ML suspension Take 5 mLs (200 mg total) by mouth daily. 12/21/14  Yes Maryann Mikhail, DO  omeprazole (PRILOSEC) 20 MG capsule Take 40 mg by mouth daily.     Yes Historical Provider, MD  oxyCODONE-acetaminophen (PERCOCET/ROXICET) 5-325 MG tablet Take 1 tablet by mouth every 4 (four) hours as needed. pain 01/23/15  Yes Historical Provider, MD  pioglitazone (ACTOS) 30 MG tablet Take 30 mg by mouth daily. 11/01/14  Yes Historical Provider, MD  potassium chloride (K-DUR) 10 MEQ tablet Take 10 mEq by mouth daily. 12/28/14  Yes Historical Provider, MD  simvastatin (ZOCOR) 20 MG tablet Take 20 mg by mouth daily. 02/21/15  Yes Historical Provider, MD  traMADol (ULTRAM) 50 MG tablet Take 1 tablet (50 mg total) by mouth every 6 (six) hours as needed. Patient taking differently: Take 50 mg by mouth every 6 (six) hours as needed for moderate pain.  09/25/13  Yes Gregor Hams, MD  traZODone (DESYREL) 50 MG tablet Take 50 mg by mouth at bedtime as needed for sleep.  11/23/14  Yes Historical Provider, MD  VENTOLIN HFA 108 (90 BASE) MCG/ACT inhaler Inhale 2 puffs into the lungs every 4 (four) hours as needed for wheezing or shortness of breath.  11/23/14  Yes Historical Provider, MD  levofloxacin (LEVAQUIN) 500 MG tablet Take 500 mg by mouth daily. Reported on 03/26/2015 03/07/15   Historical Provider, MD   Liver Function Tests  Recent Labs Lab 03/31/2015 2321  AST 31  ALT 11*  ALKPHOS 52  BILITOT 0.7  PROT 7.3  ALBUMIN 3.6   No results for input(s): LIPASE, AMYLASE in the last 168 hours. CBC  Recent Labs Lab 03/20/2015 2321  WBC 11.1*  NEUTROABS 8.5*  HGB 9.8*  HCT 30.7*  MCV 89.5  PLT 937   Basic Metabolic Panel  Recent Labs Lab 03/14/2015 2321 03/16/15 1323  NA 143 147*  K 6.9* 5.3*  CL 112* 117*   CO2 13* 16*  GLUCOSE 105* 139*  BUN 66* 64*  CREATININE 6.41* 5.98*  CALCIUM 9.5 8.7*    Filed Vitals:   03/16/15 1430 03/16/15 1445 03/16/15 1545 03/16/15 1702  BP: 97/66 86/48 90/55   Pulse: 103 102 100   Temp:      TempSrc:      Resp: _0 Height:      Weight:      SpO2: 85% 99% 97% 96%   Exam Alert, no distress, dry cough No rash, cyanosis or gangrene Sclera anicteric, throat clear  JVP up 10-15 cm, no bruits Chest rales R base 1/4 up, L clear no wheezes RRR soft SEM no rg Abd soft nontender nondist +BS no ascites or HSM no scars GU deferred MS no joint effusions Ext no LE or UE edema, no sacral edema Neuro slight tremor, no asterixis, nonfocal, Ox 3   Date  Creat   eGFR  CKD 2008-2010 1.10- 1.40 37- 47  Stage III 2012  0.9- 2.32 20- 60  Stage III- IV Oct 2016 1.54- 2.64 16- 31  Stage IV Jan 12  6.41  6 Mar 15 2014 5.98  6   UA not sent yet Na 143 K 6.9 CL 112 CO2 13  BUN 66 Creat 6.41  Ca 9.5  Glu 105  Alb 3.6 LFT's ok   LA 2.7, PC 3.69  WBC 11 Hb 9.8 plt 279 CXR dense RLL changes Renal US 7.6 and 7.8 cm kidneys, normal echo no hydro  Assessment: 1 Acute on CKD 4 - see above, she has progressive renal failure. Kidneys small on [place]. Concerned she may be ESRD. Not a real good HD candidate at first glance, have d/w family briefly , will plan to see what we can accomplish with medical Rx , which in this case is IVF's ,  abx for PNA.  Get UA. 2 PNA RLL - IV abx 3 DM2 4 HTN holding hyzaar/HCT 5 Hx BRCA 6 Hyperkalemia 7 Vol no vol excess on exam, poss dry   Plan - as above. Changed IVF to hypotonic solution.   Kelly Splinter MD [employer] pager [phone]    cell [phone] 03/16/2015, 5:09 PM

## 2015-03-16 NOTE — Progress Notes (Signed)
Pt seen and examined, 79/F with CKD3, DM, HTN, Asthma, breast CA admitted with DoE, cough, found to have CAP, AKi with hyperkalemia and metabolic acidosis Aki on CKD 3 with hyperkalemia -baseline creatinine 1.5 -received, Iv Insulin, kayexalate -add NS at 100cc/hr, Renal US without hydronephrosis, repeat Bmet, Renal consulted on admission -was taking NSAIDs, Hyzaar and poor Po intake recently-suspect all contributing -repeat Bmet now CAP -was started on Vanc/Zosyn, de-escalate Abx quickly Repeat labs this am  D/w daughter at [place], [place]

## 2015-03-16 NOTE — ED Notes (Signed)
Pt can go to floor at 16:18

## 2015-03-16 NOTE — ED Notes (Signed)
[facility] RN informed Provider of critical lactic

## 2015-03-17 ENCOUNTER — Inpatient Hospital Stay (HOSPITAL_COMMUNITY): Payer: Medicare Other

## 2015-03-17 ENCOUNTER — Encounter (HOSPITAL_COMMUNITY): Payer: Self-pay | Admitting: Pulmonary Disease

## 2015-03-17 DIAGNOSIS — E875 Hyperkalemia: Secondary | ICD-10-CM

## 2015-03-17 DIAGNOSIS — J189 Pneumonia, unspecified organism: Secondary | ICD-10-CM

## 2015-03-17 DIAGNOSIS — N183 Chronic kidney disease, stage 3 (moderate): Secondary | ICD-10-CM

## 2015-03-17 DIAGNOSIS — J9601 Acute respiratory failure with hypoxia: Secondary | ICD-10-CM

## 2015-03-17 DIAGNOSIS — N179 Acute kidney failure, unspecified: Secondary | ICD-10-CM

## 2015-03-17 DIAGNOSIS — J810 Acute pulmonary edema: Secondary | ICD-10-CM | POA: Diagnosis present

## 2015-03-17 LAB — BLOOD GAS, ARTERIAL
Acid-base deficit: 11.7 mmol/L — ABNORMAL HIGH (ref 0.0–2.0)
Acid-base deficit: 9.8 mmol/L — ABNORMAL HIGH (ref 0.0–2.0)
Bicarbonate: 13.1 mEq/L — ABNORMAL LOW (ref 20.0–24.0)
Bicarbonate: 14.5 mEq/L — ABNORMAL LOW (ref 20.0–24.0)
Drawn by: 307971
O2 Content: 4 L/min
O2 Content: 2 L/min
O2 Saturation: 87.8 %
O2 Saturation: 92.2 %
PCO2 ART: 27.2 mmHg — AB (ref 35.0–45.0)
PH ART: 7.362 (ref 7.350–7.450)
PO2 ART: 60.1 mmHg — AB (ref 80.0–100.0)
Patient temperature: 37
Patient temperature: 98.6
TCO2: 12.5 mmol/L (ref 0–100)
TCO2: 15.4 mmol/L (ref 0–100)
pCO2 arterial: 26.3 mmHg — ABNORMAL LOW (ref 35.0–45.0)
pH, Arterial: 7.304 — ABNORMAL LOW (ref 7.350–7.450)
pO2, Arterial: 65.2 mmHg — ABNORMAL LOW (ref 80.0–100.0)

## 2015-03-17 LAB — URINALYSIS, ROUTINE W REFLEX MICROSCOPIC
Bilirubin Urine: NEGATIVE
GLUCOSE, UA: NEGATIVE mg/dL
HGB URINE DIPSTICK: NEGATIVE
Ketones, ur: NEGATIVE mg/dL
Leukocytes, UA: NEGATIVE
Nitrite: NEGATIVE
Protein, ur: NEGATIVE mg/dL
SPECIFIC GRAVITY, URINE: 1.019 (ref 1.005–1.030)
pH: 5 (ref 5.0–8.0)

## 2015-03-17 LAB — GLUCOSE, CAPILLARY
GLUCOSE-CAPILLARY: 96 mg/dL (ref 65–99)
GLUCOSE-CAPILLARY: 96 mg/dL (ref 65–99)
GLUCOSE-CAPILLARY: 114 mg/dL — AB (ref 65–99)
GLUCOSE-CAPILLARY: 112 mg/dL — AB (ref 65–99)
Glucose-Capillary: 117 mg/dL — ABNORMAL HIGH (ref 65–99)

## 2015-03-17 LAB — BASIC METABOLIC PANEL
Anion gap: 11 (ref 5–15)
BUN: 53 mg/dL — AB (ref 6–20)
CHLORIDE: 115 mmol/L — AB (ref 101–111)
CO2: 17 mmol/L — AB (ref 22–32)
Calcium: 8.1 mg/dL — ABNORMAL LOW (ref 8.9–10.3)
Creatinine, Ser: 5.55 mg/dL — ABNORMAL HIGH (ref 0.44–1.00)
GFR calc Af Amer: 8 mL/min — ABNORMAL LOW (ref >60)
GFR, EST NON AFRICAN AMERICAN: 7 mL/min — AB (ref >60)
GLUCOSE: 114 mg/dL — AB (ref 65–99)
POTASSIUM: 4.7 mmol/L (ref 3.5–5.1)
Sodium: 143 mmol/L (ref 135–145)

## 2015-03-17 LAB — CBC
HCT: 27.9 % — ABNORMAL LOW (ref 36.0–46.0)
Hemoglobin: 8.8 g/dL — ABNORMAL LOW (ref 12.0–15.0)
MCH: 29.2 pg (ref 26.0–34.0)
MCHC: 31.5 g/dL (ref 30.0–36.0)
MCV: 92.7 fL (ref 78.0–100.0)
PLATELETS: 221 10*3/uL (ref 150–400)
RBC: 3.01 MIL/uL — AB (ref 3.87–5.11)
RDW: 15.6 % — ABNORMAL HIGH (ref 11.5–15.5)
WBC: 11.3 10*3/uL — ABNORMAL HIGH (ref 4.0–10.5)

## 2015-03-17 LAB — MRSA PCR SCREENING: MRSA by PCR: NEGATIVE

## 2015-03-17 LAB — INFLUENZA PANEL BY PCR (TYPE A & B)
H1N1 flu by pcr: NOT DETECTED
Influenza A By PCR: NEGATIVE
Influenza B By PCR: NEGATIVE

## 2015-03-17 LAB — STREP PNEUMONIAE URINARY ANTIGEN: Strep Pneumo Urinary Antigen: NEGATIVE

## 2015-03-17 LAB — LACTIC ACID, PLASMA: Lactic Acid, Venous: 3.1 mmol/L (ref 0.5–2.0)

## 2015-03-17 LAB — CREATININE, URINE, RANDOM: Creatinine, Urine: 189.01 mg/dL

## 2015-03-17 MED ORDER — HEPARIN SODIUM (PORCINE) 5000 UNIT/ML IJ SOLN
5000.0000 [IU] | Freq: Three times a day (TID) | INTRAMUSCULAR | Status: DC
  Administered 2015-03-17 – 2015-03-18 (×2): 5000 [IU] via SUBCUTANEOUS
  Filled 2015-03-17 (×2): qty 1

## 2015-03-17 MED ORDER — WHITE PETROLATUM GEL
Status: AC
  Administered 2015-03-17: 0.2
  Filled 2015-03-17: qty 1

## 2015-03-17 MED ORDER — SODIUM BICARBONATE 8.4 % IV SOLN
INTRAVENOUS | Status: DC
  Administered 2015-03-17: 13:00:00 via INTRAVENOUS
  Filled 2015-03-17: qty 150

## 2015-03-17 MED ORDER — IPRATROPIUM-ALBUTEROL 0.5-2.5 (3) MG/3ML IN SOLN
3.0000 mL | Freq: Four times a day (QID) | RESPIRATORY_TRACT | Status: DC
  Administered 2015-03-17 – 2015-03-29 (×47): 3 mL via RESPIRATORY_TRACT
  Filled 2015-03-17 (×47): qty 3

## 2015-03-17 MED ORDER — RENA-VITE PO TABS
1.0000 | ORAL_TABLET | Freq: Two times a day (BID) | ORAL | Status: DC
  Administered 2015-03-17 – 2015-03-27 (×8): 1 via ORAL
  Filled 2015-03-17 (×10): qty 1

## 2015-03-17 MED ORDER — SODIUM CHLORIDE 0.9 % IV BOLUS (SEPSIS)
250.0000 mL | Freq: Once | INTRAVENOUS | Status: AC
  Administered 2015-03-17: 250 mL via INTRAVENOUS

## 2015-03-17 MED ORDER — FUROSEMIDE 10 MG/ML IJ SOLN
100.0000 mg | Freq: Three times a day (TID) | INTRAVENOUS | Status: DC
  Administered 2015-03-17 – 2015-03-18 (×2): 100 mg via INTRAVENOUS
  Filled 2015-03-17 (×5): qty 10

## 2015-03-17 MED ORDER — FUROSEMIDE 10 MG/ML IJ SOLN
100.0000 mg | Freq: Once | INTRAVENOUS | Status: AC
  Administered 2015-03-17: 100 mg via INTRAVENOUS
  Filled 2015-03-17: qty 10

## 2015-03-17 MED ORDER — INSULIN ASPART 100 UNIT/ML ~~LOC~~ SOLN
0.0000 [IU] | SUBCUTANEOUS | Status: DC
  Administered 2015-03-18 – 2015-03-27 (×5): 1 [IU] via SUBCUTANEOUS

## 2015-03-17 MED ORDER — PANTOPRAZOLE SODIUM 40 MG IV SOLR
40.0000 mg | INTRAVENOUS | Status: DC
  Administered 2015-03-17 – 2015-03-25 (×8): 40 mg via INTRAVENOUS
  Filled 2015-03-17 (×10): qty 40

## 2015-03-17 NOTE — Consult Note (Signed)
PULMONARY / CRITICAL CARE MEDICINE   Name: Stacey Zamora MRN: [id number] DOB: [birth year]    ADMISSION DATE:  03/25/2015 CONSULTATION DATE:  1/14/207  REFERRING MD:  Dr. Broadus [name], [facility]  CHIEF COMPLAINT:  Hallucinations  HISTORY OF PRESENT ILLNESS:   80 yo female was treated for pneumonia as an outpatient.  She started getting hallucinations from he antibiotics, and her PCP advised she be admitted.  She was taking levaquin.  She was still having cough with brown sputum and feeling short of breath.  She was noted to have worsening renal function and nephrology consulted.  she was taking NSAIDs and hyzaar as outpatient.  She was started on vancomycin and zosyn for pneumonia.  Her breathing got worse, and she was noted to have wheezing.  PAST MEDICAL HISTORY :  She  has a past medical history of Hypertension; Asthma; Shortness of breath; Cancer [name]); GERD (gastroesophageal reflux disease); Arthritis; Pneumonia; Diabetes mellitus; HLD (hyperlipidemia); CKD (chronic kidney disease), stage III; and GERD (gastroesophageal reflux disease).  PAST SURGICAL HISTORY: She  has past surgical history that includes Wrist surgery; Breast surgery; Abdominal hysterectomy; Cholecystectomy; and carpel tunnel.  Allergies  Allergen Reactions  . Eggs Or Egg-Derived Products Hives  . Morphine And Related Itching and Other (See Comments)    Crawling sensations  . Vicodin [Hydrocodone-Acetaminophen] Itching and Other (See Comments)    Crawling sensations  . Banana Other (See Comments)    Pt does not eat bananas  . Levaquin [Levofloxacin] Other (See Comments)    Hallucinations: Pt was to take 10 day course, but began to hallucinate after day 8.  . Shellfish Allergy Other (See Comments)    Pt doesn't eat SeaFood at all. No Fish, no Shrimp, etc    No current facility-administered medications on file prior to encounter.   Current Outpatient Prescriptions on File Prior to Encounter  Medication Sig  . ADVAIR DISKUS  250-50 MCG/DOSE AEPB Inhale 1 puff into the lungs 2 (two) times daily.  Marland Kitchen amitriptyline (ELAVIL) 50 MG tablet Take 50 mg by mouth at bedtime.    . benzonatate (TESSALON) 100 MG capsule Take 1 capsule (100 mg total) by mouth 3 (three) times daily as needed for cough.  . BYSTOLIC 10 MG tablet Take 10 mg by mouth daily.  Marland Kitchen docusate sodium (COLACE) 100 MG capsule Take 1 capsule (100 mg total) by mouth 2 (two) times daily. (Patient taking differently: Take 100 mg by mouth 2 (two) times daily as needed for moderate constipation. )  . feeding supplement, GLUCERNA SHAKE, (GLUCERNA SHAKE) LIQD Take 237 mLs by mouth 3 (three) times daily between meals.  . fluticasone (FLONASE) 50 MCG/ACT nasal spray Place 2 sprays into the nose daily as needed for allergies.   Marland Kitchen gabapentin (NEURONTIN) 100 MG capsule Take 100 mg by mouth 3 (three) times daily.  Marland Kitchen loratadine (CLARITIN) 10 MG tablet Take 10 mg by mouth daily.    . magnesium oxide (MAG-OX) 400 (241.3 MG) MG tablet Take 1 tablet by mouth daily.  . megestrol (MEGACE) 400 MG/10ML suspension Take 5 mLs (200 mg total) by mouth daily.  Marland Kitchen omeprazole (PRILOSEC) 20 MG capsule Take 40 mg by mouth daily.    . pioglitazone (ACTOS) 30 MG tablet Take 30 mg by mouth daily.  . traMADol (ULTRAM) 50 MG tablet Take 1 tablet (50 mg total) by mouth every 6 (six) hours as needed. (Patient taking differently: Take 50 mg by mouth every 6 (six) hours as needed for moderate pain. )  .  traZODone (DESYREL) 50 MG tablet Take 50 mg by mouth at bedtime as needed for sleep.   . VENTOLIN HFA 108 (90 BASE) MCG/ACT inhaler Inhale 2 puffs into the lungs every 4 (four) hours as needed for wheezing or shortness of breath.     FAMILY HISTORY:  Her family history is negative for CJD.  SOCIAL HISTORY: She  reports that she has been smoking E-cigarettes.  She has quit using smokeless tobacco. She reports that she does not drink alcohol or use illicit drugs.  REVIEW OF SYSTEMS:   Denies chest  pain.  Feels sleepy.  Denies abdominal pain.  SUBJECTIVE:  Wants to sleep.  VITAL SIGNS: BP 106/37 mmHg  Pulse 102  Temp(Src) 98.6 F (37 C) (Axillary)  Resp 18  Ht 5\' 2"  (1.575 m)  Wt 172 lb (78.019 kg)  BMI 31.45 kg/m2  SpO2 95%  HEMODYNAMICS:    VENTILATOR SETTINGS:    INTAKE / OUTPUT: I/O last 3 completed shifts: In: 2800 [I.V.:1150; IV Piggyback:1650] Out: -   PHYSICAL EXAMINATION: General:  somnolent Neuro:  Wakes up easily, follows commands, moves extremities HEENT:  Pupils reactive, dry mucosa Cardiovascular:  Regular, tachycardic Lungs:  Decreased BS on Rt > Lt with basilar crackles, no wheeze Abdomen:  Soft, non tender Musculoskeletal:  1+ edema Skin:  No rashes  LABS:  BMET  Recent Labs Lab 03/13/2015 2321 03/16/15 1323 03/17/15 0454  NA 143 147* 143  K 6.9* 5.3* 4.7  CL 112* 117* 115*  CO2 13* 16* 17*  BUN 66* 64* 53*  CREATININE 6.41* 5.98* 5.55*  GLUCOSE 105* 139* 114*    Electrolytes  Recent Labs Lab 03/16/2015 2321 03/16/15 1323 03/17/15 0454  CALCIUM 9.5 8.7* 8.1*    CBC  Recent Labs Lab 03/22/2015 2321 03/17/15 0454  WBC 11.1* 11.3*  HGB 9.8* 8.8*  HCT 30.7* 27.9*  PLT 279 221    Coag's  Recent Labs Lab 03/16/15 0255  APTT 29  INR 1.26    Sepsis Markers  Recent Labs Lab 03/16/15 0255  03/16/15 1850 03/16/15 2130 03/16/15 2352  LATICACIDVEN  --   < > 2.4* 2.5* 3.1*  PROCALCITON 3.69  --   --   --   --   < > = values in this interval not displayed.  ABG  Recent Labs Lab 03/17/15 1020  PHART 7.304*  PCO2ART 27.2*  PO2ART 60.1*    Liver Enzymes  Recent Labs Lab 03/06/2015 2321  AST 31  ALT 11*  ALKPHOS 52  BILITOT 0.7  ALBUMIN 3.6    Cardiac Enzymes No results for input(s): TROPONINI, PROBNP in the last 168 hours.  Glucose  Recent Labs Lab 04/02/2015 2255 03/16/15 0856 03/16/15 1241 03/16/15 1808 03/16/15 2249 03/17/15 0714  GLUCAP 97 85 119* 110* 138* 96    Imaging Dg Chest 2  View  03/17/2015  CLINICAL DATA:  Wheezing and shortness of breath today. History of hypertension and asthma. EXAM: CHEST  2 VIEW COMPARISON:  03/06/2015 FINDINGS: Extensive right lung consolidation, most confluent the right lung base, is similar to the previous day's study. Left lung is essentially clear. No pneumothorax. Cardiac silhouette is normal in size. No mediastinal or left hilar mass. Right hilum is obscured. No convincing pleural effusion. IMPRESSION: Extensive right lung consolidation most evident in the right lower lobe, consistent with pneumonia. No significant change from the prior chest radiograph. Left lung remains clear. Electronically Signed   By: Lajean Manes M.D.   On: 03/17/2015 10:18  STUDIES:  1/13 Renal u/s >> no hydronephrosis  CULTURES: 1/13 Legionella >> 1/13 Pneumococcal >>  1/13 Blood >>  1/13 Influenza >>   ANTIBIOTICS: 1/13 Zosyn >> 1/13 Vancomycin >>  SIGNIFICANT EVENTS: 1/12 Admit 1/13 Nephrology consulted 1/14 To ICU  LINES/TUBES:  DISCUSSION: 80 yo female with dyspnea, productive cough from pneumonia.  Admitted with hallucination from outpatient levaquin therapy.  Now with acute on chronic renal failure.  ASSESSMENT / PLAN:  PULMONARY A: Acute hypoxic respiratory failure from PNA. Hx of Asthma. P:   Oxygen to keep SpO2 > 92% BiPAP prn F/u CXR Prn BDs  CARDIOVASCULAR A:  Sepsis from PNA. Hx of HTN, HLD. P:  Monitor hemodynamics Hold zocor, bystolic, hyzaar  RENAL A:   AKI >> baseline creatinine 1.54 from October 2016. CKD. Non gap metabolic acidosis. Lactic acidosis. P:   Place foley Add HCO3 to IV fluid Continue lasix per nephrology Might need HD >> can put HD cath in when she goes to [facility] unless she needs CRRT, and then can be done at [place]:   Nutrition. Hx of GERD. P:   NPO  Protonix for SUP  HEMATOLOGIC A:   Anemia of chronic disease and critical illness. P:  F/u CBC SQ heparin for DVT  prophylaxis  INFECTIOUS A:   Sepsis from pneumonia. P:   Day 2 vancomycin, zosyn  ENDOCRINE A:   Hx of DM type II with renal complications. P:   SSI Hold outpt actos  NEUROLOGIC A:   Acute encephalopathy 2nd to pneumonia, sepsis, renal failure, respiratory failure. P:   Monitor mental status D/c elavil Hold outpt neurontin, tramadol, trazodone  D/w Dr. Broadus [name]  CC time 42 minutes.  Chesley Mires, MD [facility] Pulmonary/Critical Care 03/17/2015, 11:44 AM Pager:  [phone] After 3pm call: [phone]

## 2015-03-17 NOTE — Progress Notes (Signed)
[facility] PROGRESS NOTE  Stacey Zamora [id number] DOB: 01/17/1936 DOA: 03/11/2015 PCP: Philis Fendt, MD  Assessment/Plan: 1. Acute Hypoxic resp failure -due to Pulm edema and CAP, over 3L positive since yesterday, very poor urine output, 150cc this am -was given IVF for 24hours since admission for AKI -Stopped IVF, IV lasix 100mg  given -BIPAP, ABG, PCCM consult need urgent HD catheter and move to [facility] -D/w Renal Dr.Schertz following, d/w family regarding HD, they want aggressive full scope of treatment -currently on broad spectrum Abx, de-escalate when able -NPO now  2. AKI on CKD3 -baseline creatinine 1.5, admission creatinine yesterday was 6.4, it is 5.5 today -but today she is volume overloaded, IV lasix, BIPAP -Renal US without hydronephrosis, Renal following -was taking NSAIDs, Hyzaar and poor Po intake recently-suspect all contributing -Urgent HD per Dr.Schertz today pending Tx to [facility]  3. Hyperkalemia: Potassium 6.9 on admission, due to AKI -improved, treated with insulin, bicarbonate, kayexalate  4. HTN: Blood pressure was soft, improved now with with pulm edema -Hold Hyzaar -Continue bystolic  5. DM-II: Last A1c 6.1, well controled on Actos at home -SSI for now  6. HLD:  -Continue Zocor  GERD: -Protonix  DVT ppx: SQ Heparin-hold for now, due to need for HD catheter  Code Status: [place] Family Communication: daughter and grand daughter at bedside, discussed overall poor prognosis and Renal felt that she wouldn't be a good long term Hd candidate, but they are adamant abt wanting full aggressive scope of RX, HD, mechanical ventilation etc as needed [place]position Plan: [place] [facility] availability   Consultants:  Renal '  PCCM  HPI/Subjective: Poor urine output, made 150cc overnight  Objective: Filed Vitals:   03/17/15 0120 03/17/15 0558  BP:  106/37  Pulse:  102  Temp: 98.5 F (36.9 C) 98.6 F (37 C)  Resp:  18    Intake/Output  Summary (Last 24 hours) at 03/17/15 1109 Last data filed at 03/17/15 0900  Gross per 24 hour  Intake   2810 ml  Output      0 ml  Net   2810 ml   Filed Weights   03/28/2015 1512  Weight: 78.019 kg (172 lb)    Exam:   General:  Somnolent, arousable, oriented to self only  Cardiovascular: S1S2/RRR  Respiratory: crackles in both lungs R>L  Abdomen: soft, NT, BS present  Musculoskeletal: no edema c/c   Data Reviewed: Basic Metabolic Panel:  Recent Labs Lab 04/03/2015 2321 03/16/15 1323 03/17/15 0454  NA 143 147* 143  K 6.9* 5.3* 4.7  CL 112* 117* 115*  CO2 13* 16* 17*  GLUCOSE 105* 139* 114*  BUN 66* 64* 53*  CREATININE 6.41* 5.98* 5.55*  CALCIUM 9.5 8.7* 8.1*   Liver Function Tests:  Recent Labs Lab 03/20/2015 2321  AST 31  ALT 11*  ALKPHOS 52  BILITOT 0.7  PROT 7.3  ALBUMIN 3.6   No results for input(s): LIPASE, AMYLASE in the last 168 hours. No results for input(s): AMMONIA in the last 168 hours. CBC:  Recent Labs Lab 03/29/2015 2321 03/17/15 0454  WBC 11.1* 11.3*  NEUTROABS 8.5*  --   HGB 9.8* 8.8*  HCT 30.7* 27.9*  MCV 89.5 92.7  PLT 279 221   Cardiac Enzymes: No results for input(s): CKTOTAL, CKMB, CKMBINDEX, TROPONINI in the last 168 hours. BNP (last 3 results)  Recent Labs  12/21/14 0910  BNP 627.4*    ProBNP (last 3 results) No results for input(s): PROBNP in the  last 8760 hours.  CBG:  Recent Labs Lab 03/16/15 0856 03/16/15 1241 03/16/15 1808 03/16/15 2249 03/17/15 0714  GLUCAP 85 119* 110* 138* 96    No results found for this or any previous visit (from the past 240 hour(s)).   Studies: Dg Chest 2 View  03/17/2015  CLINICAL DATA:  Wheezing and shortness of breath today. History of hypertension and asthma. EXAM: CHEST  2 VIEW COMPARISON:  04/02/2015 FINDINGS: Extensive right lung consolidation, most confluent the right lung base, is similar to the previous day's study. Left lung is essentially clear. No pneumothorax.  Cardiac silhouette is normal in size. No mediastinal or left hilar mass. Right hilum is obscured. No convincing pleural effusion. IMPRESSION: Extensive right lung consolidation most evident in the right lower lobe, consistent with pneumonia. No significant change from the prior chest radiograph. Left lung remains clear. Electronically Signed   By: Lajean Manes M.D.   On: 03/17/2015 10:18   Dg Chest 2 View  03/20/2015  CLINICAL DATA:  Productive cough for 3 months. EXAM: CHEST - 2 VIEW COMPARISON:  Two-view chest x-ray 12/17/2014. FINDINGS: The heart is mildly enlarged. Right lower and likely middle lobe airspace disease is present. Interstitial and airspace opacities extend into the right upper lobe. The left lung is clear apart from minimal left basilar atelectasis. The visualized soft tissues and bony thorax are unremarkable. IMPRESSION: 1. Progressive right lower lobe and middle lobe airspace disease compatible with pneumonia. 2. Interstitial and airspace disease in the right upper lobe compatible with infection or edema. 3. Cardiomegaly. 4. Minimal atelectasis at the left base. Electronically Signed   By: San Morelle M.D.   On: 03/06/2015 15:47   US Renal  03/16/2015  CLINICAL DATA:  Initial evaluation for acute kidney injury. EXAM: RENAL / URINARY TRACT ULTRASOUND COMPLETE COMPARISON:  Prior study from 01/29/2011. FINDINGS: Right Kidney: Length: 7.8 cm. Echogenicity within normal limits. No mass or hydronephrosis visualized. Left Kidney: Length: 7.6 cm. Echogenicity within normal limits. No solid mass or hydronephrosis visualized. 3.2 x 3.0 x 3.0 cyst noted at the lower pole. This is similar to prior. Bladder: Appears normal for degree of bladder distention. IMPRESSION: No acute findings.  No hydronephrosis. Electronically Signed   By: Jeannine Boga M.D.   On: 03/16/2015 04:00    Scheduled Meds: . amitriptyline  50 mg Oral QHS  . feeding supplement (GLUCERNA SHAKE)  237 mL Oral TID  BM  . furosemide  100 mg Intravenous Once  . gabapentin  100 mg Oral TID  . heparin  5,000 Units Subcutaneous 3 times per day  . insulin aspart  0-9 Units Subcutaneous TID WC  . ipratropium-albuterol  3 mL Nebulization Q4H  . loratadine  5 mg Oral Daily  . magnesium oxide  400 mg Oral Daily  . megestrol  200 mg Oral Daily  . pantoprazole  40 mg Oral Daily  . piperacillin-tazobactam (ZOSYN)  IV  2.25 g Intravenous 3 times per day  . polyethylene glycol  17 g Oral Daily  . simvastatin  20 mg Oral Daily  . sodium chloride  500 mL Intravenous Once   Continuous Infusions:  Antibiotics Given (last 72 hours)    None      Principal Problem:   HCAP (healthcare-associated pneumonia) Active Problems:   Dehydration   Diabetes type 2, controlled (HCC)   HTN (hypertension)   Hyperkalemia   Sepsis ([place])   Acute renal failure superimposed on stage 3 chronic kidney disease ([place])   HLD (  hyperlipidemia)   GERD (gastroesophageal reflux disease)   Constipation    Time spent: 50min    Jia Mohamed  [facility] Pager [phone]. If 7PM-7AM, please contact night-coverage at [email or web address], password [facility] 03/17/2015, 11:09 AM  LOS: 1 day

## 2015-03-17 NOTE — Progress Notes (Signed)
RT and MD discussed the possibility for BiPAP PRN. RT has not placed PT on BiPAP at this point. PT has not appeared to be in respiratory distress- RN aware. RT called RT at [facility] and gave report.

## 2015-03-17 NOTE — Progress Notes (Signed)
Spoke with Dr. Augustin Coupe concerning decrease urine output, recommend to discontinue IV bolus to prevent protential overload and monitor pt. Report given to T. Rogue Bussing NP, order received to decrease IVF to 50cc/hr and monitor pt. SRP,RN

## 2015-03-17 NOTE — Progress Notes (Signed)
Patient transferred to room 1229 at [facility] for monitoring; awaiting bed availability at [facility]. Report given to receiving nurse.

## 2015-03-17 NOTE — Progress Notes (Signed)
  [facility] Progress Note   Subjective: resp distress this am, responded good to IV lasix and is breathing better now. Says she does want dialysis if necessary at this point.   Filed Vitals:   03/17/15 1600 03/17/15 1700 03/17/15 1900 03/17/15 1943  BP:  124/56  126/57  Pulse:  103  107  Temp: 97.8 F (36.6 C)   97.7 F (36.5 C)  TempSrc: Oral   Oral  Resp:  21  21  Height:      Weight: 85.1 kg (187 lb 9.8 oz)     SpO2:  97% 93% 95%    Inpatient medications: . furosemide  100 mg Intravenous 3 times per day  . heparin subcutaneous  5,000 Units Subcutaneous 3 times per day  . insulin aspart  0-9 Units Subcutaneous 6 times per day  . ipratropium-albuterol  3 mL Nebulization Q6H  . pantoprazole (PROTONIX) IV  40 mg Intravenous Q24H  . piperacillin-tazobactam (ZOSYN)  IV  2.25 g Intravenous 3 times per day   .  sodium bicarbonate  infusion 1000 mL 40 mL/hr at 03/17/15 1800   albuterol, docusate sodium  Exam: Lethargic , pleasant, responsive +JVD Chest absent BS R base 1/2 up, L rales at the base only RRR no RG ABd obese soft ntnd no ascites Ext 1+ edema Neuro sleepy, arouses easily, answers questions, mostly oriented  DateCreat eGFRCKD 2008-20101.10- 1.4037- 47Stage III 20120.9- 2.3220- 60Stage III- IV Oct 20161.54- 2.6416- 31Stage IV Jan 126.416 Jan 13 20165.986   UA neg protein, hazy, yellow, no rbc/ wbc CXR dense RLL changes and today's xray w new superimposed pulm edema Renal US 7.6 and 7.8 cm kidneys, normal echo no hydro      Assessment: 1 Advanced CKD stage 5 - atrophic kidneys, no proteinuria so this is something other than diab disease most likely.  She is uremic and has been prob for months. 60 lbs wt loss, confusion and debility which has been  progressive. She wants everything done at this point and so does family. Plan consult for tunneled HD cath and get HD started this week.   2 SOB due to PNA and new pulm edema, better w lasix and off IVF 3 PNA 4 HTN hyzaar on hold 5 Hx breast cancer 6 Vol exceses 7 Hyperkalemia   Plan - cont IV lasix 100 q 8 hrs. HD to start this week.    Kelly Splinter MD [facility] pager [phone]    cell [phone] 03/17/2015, 8:26 PM    Recent Labs Lab 03/21/2015 2321 03/16/15 1323 03/17/15 0454  NA 143 147* 143  K 6.9* 5.3* 4.7  CL 112* 117* 115*  CO2 13* 16* 17*  GLUCOSE 105* 139* 114*  BUN 66* 64* 53*  CREATININE 6.41* 5.98* 5.55*  CALCIUM 9.5 8.7* 8.1*    Recent Labs Lab 03/17/2015 2321  AST 31  ALT 11*  ALKPHOS 52  BILITOT 0.7  PROT 7.3  ALBUMIN 3.6    Recent Labs Lab 03/16/2015 2321 03/17/15 0454  WBC 11.1* 11.3*  NEUTROABS 8.5*  --   HGB 9.8* 8.8*  HCT 30.7* 27.9*  MCV 89.5 92.7  PLT 279 221

## 2015-03-17 NOTE — Progress Notes (Signed)
Spoke with Dr. Broadus [name] regarding patient's respiratory status and bloody stool. Per MD, new orders entered, patient will be transferred to ICU at [facility], and MD on way to [facility] to speak with family who is currently in room with patient.

## 2015-03-17 NOTE — Progress Notes (Deleted)
Decreased UOP--bladder scanned noted, pt with only 88-94 cc noted in bladder. SRP, RN

## 2015-03-17 NOTE — Progress Notes (Signed)
Follow up from Critical value alert, orders received and completed. Additional lab ordered.

## 2015-03-17 NOTE — Progress Notes (Signed)
Patient with BP in [id number] and [id number] systolically.  Dr. Lamonte Sakai notified of BP and updated Dr. Lamonte Sakai on the events of the day with the patient.  Dr. Lamonte Sakai stated to send the patient to [facility] 3S 08C-01 as is currently.  Patient did have a small bloody liquid stool in the bedpan before the patient moved to the stretcher.  Continue to monitor patient closely.  NaHCO3 drip continues to infuse with Zosyn infusing as ordered.  Heparin held due to blood in stool currently.  Shiori Adcox Roselie Awkward RN

## 2015-03-17 NOTE — Progress Notes (Signed)
Decreased UOP--bladder scanned noted, pt with only 88-94 cc noted in bladder. SRP, RN

## 2015-03-17 NOTE — Evaluation (Signed)
Physical Therapy Evaluation (PT order discontinued after evaluation performed) Patient Details Name: Stacey Zamora MRN: [id number] DOB: [birth year] Today's Date: 03/17/2015   History of Present Illness  80 y.o. year-old with hx of HTN, DM and asthma, R breast CA, PNA, HL and CKD and admitted with acute on CKD 4 and pneumonia.  Plan for transfer to [facility] for HD.  Clinical Impression  Pt admitted with above diagnosis. Pt currently with functional limitations due to the deficits listed below (see PT Problem List).  Pt will benefit from skilled PT to increase their independence and safety with mobility to allow discharge to the venue listed below.   Pt somnolent however able to sit EOB, upon sitting requested using bathroom so daughter assisted with safe stand pivot to [facility].  Pt remained on 4L O2 [place].  Daughter reports pt lives with her other daughter and plan is for pt to d/c back home.   ** Pt transferred to SDU and PT order discontinued after evaluation performed.     Follow Up Recommendations Home health PT;Supervision/Assistance - 24 hour    Equipment Recommendations  None recommended by PT (daughter believes no DME needs however plans to check)    Recommendations for Other Services       Precautions / Restrictions Precautions Precautions: Fall Precaution Comments: monitor sats Restrictions Weight Bearing Restrictions: No      Mobility  Bed Mobility Overal bed mobility: Needs Assistance Bed Mobility: Supine to Sit     Supine to sit: Min guard;HOB elevated     General bed mobility comments: increased time, upon sitting pt reports need to use bathroom, daughter insistent on pt urgency and need to be up to [facility]  Transfers Overall transfer level: Needs assistance Equipment used: 1 person hand held assist Transfers: Sit to/from [facility] Sit to Stand: Min assist Stand pivot transfers: Min assist       General transfer comment: assist to steady, daughter also  assisted with transfer to [facility], removed BSC and pulled recliner behind pt, remained on 4L O2 and SpO2 94%, HR 106 bpm  Ambulation/Gait                Stairs            Wheelchair Mobility    Modified Rankin (Stroke Patients Only)       Balance                                             Pertinent Vitals/Pain Pain Assessment: No/denies pain    Home Living Family/patient expects to be discharged to:: Private residence Living Arrangements: Children Available Help at Discharge: Family;Available 24 hours/day Type of Home: House Home Access: Stairs to enter Entrance Stairs-Rails: None Entrance Stairs-Number of Steps: 1 Home Layout: One level Home Equipment: Walker - 2 wheels;Bedside commode Additional Comments: family rotates staying with pt    Prior Function Level of Independence: Needs assistance   Gait / Transfers Assistance Needed: has needed more help recently however daughter reports pt ambulatory           Hand Dominance        Extremity/Trunk Assessment               Lower Extremity Assessment: Generalized weakness         Communication   Communication: No difficulties  Cognition Arousal/Alertness: Awake/alert   Overall  Cognitive Status: Impaired/Different from baseline Area of Impairment: Orientation Orientation Level: Disoriented to;Situation;Time             General Comments: pt more confused per daughter however following simple commands    General Comments      Exercises        Assessment/Plan    PT Assessment Patient needs continued PT services  PT Diagnosis Generalized weakness   PT Problem List Decreased strength;Decreased activity tolerance;Decreased mobility;Decreased cognition;Decreased safety awareness  PT Treatment Interventions DME instruction;Gait training;Patient/family education;Functional mobility training;Therapeutic exercise;Therapeutic activities   PT Goals (Current goals can  be found in the Care Plan section) Acute Rehab PT Goals Patient Stated Goal: family plans to take pt home upon d/c PT Goal Formulation: With patient/family Time For Goal Achievement: 03/24/15 Potential to Achieve Goals: Fair    Frequency Min 3X/week   Barriers to discharge        Co-evaluation               End of Session Equipment Utilized During Treatment: Oxygen Activity Tolerance: Patient limited by fatigue Patient left: in chair;with call bell/phone within reach;with chair alarm set;with family/visitor present Nurse Communication: Mobility status         Time: [id number] PT Time Calculation (min) (ACUTE ONLY): 19 min   Charges:   PT Evaluation $PT Eval Moderate Complexity: 1 Procedure     PT G Codes:        Kirk Basquez,KATHrine E 03/17/2015, 12:01 PM Carmelia Bake, PT, DPT 03/17/2015 Pager: [phone]

## 2015-03-17 NOTE — Progress Notes (Signed)
Received called from lab--Lactic acid currently 3.1. NP notified. SRP, RN

## 2015-03-17 NOTE — Progress Notes (Signed)
Occupational Therapy  Patient Details Name: Stacey Zamora MRN: [id number] DOB: 12-01-1935 Today's Date: 03/17/2015    Patient discharged from OT services secondary to order discontinued.   [place]     GO     Payton Mccallum D 03/17/2015, 12:17 PM

## 2015-03-17 NOTE — Progress Notes (Signed)
Pt up to the BSC--voided ??150CC clear yellow urine mixed with BM. SRP, RN

## 2015-03-17 NOTE — Progress Notes (Signed)
Patient's breathing appears labored with substernal retractions. Mild Wheezing positive fluid balance. Discussed with RN who paged MD

## 2015-03-17 NOTE — Progress Notes (Signed)
Pt up to bedside commode; approx 32mL loose stool with blood noted. MD notified.

## 2015-03-18 ENCOUNTER — Inpatient Hospital Stay (HOSPITAL_COMMUNITY): Payer: Medicare Other

## 2015-03-18 DIAGNOSIS — N186 End stage renal disease: Secondary | ICD-10-CM

## 2015-03-18 DIAGNOSIS — A419 Sepsis, unspecified organism: Principal | ICD-10-CM

## 2015-03-18 DIAGNOSIS — E119 Type 2 diabetes mellitus without complications: Secondary | ICD-10-CM

## 2015-03-18 DIAGNOSIS — N183 Chronic kidney disease, stage 3 (moderate): Secondary | ICD-10-CM

## 2015-03-18 LAB — GLUCOSE, CAPILLARY
GLUCOSE-CAPILLARY: 116 mg/dL — AB (ref 65–99)
Glucose-Capillary: 118 mg/dL — ABNORMAL HIGH (ref 65–99)
Glucose-Capillary: 118 mg/dL — ABNORMAL HIGH (ref 65–99)
Glucose-Capillary: 108 mg/dL — ABNORMAL HIGH (ref 65–99)
Glucose-Capillary: 123 mg/dL — ABNORMAL HIGH (ref 65–99)

## 2015-03-18 LAB — BLOOD GAS, ARTERIAL
Acid-base deficit: 7.4 mmol/L — ABNORMAL HIGH (ref 0.0–2.0)
Bicarbonate: 16.7 mEq/L — ABNORMAL LOW (ref 20.0–24.0)
Drawn by: 236041
O2 CONTENT: 3 L/min
O2 Saturation: 91.2 %
PCO2 ART: 28.6 mmHg — AB (ref 35.0–45.0)
PH ART: 7.384 (ref 7.350–7.450)
PO2 ART: 62.6 mmHg — AB (ref 80.0–100.0)
Patient temperature: 98.6
TCO2: 17.6 mmol/L (ref 0–100)

## 2015-03-18 LAB — CBC
HCT: 26.7 % — ABNORMAL LOW (ref 36.0–46.0)
HEMOGLOBIN: 8.9 g/dL — AB (ref 12.0–15.0)
MCH: 29.2 pg (ref 26.0–34.0)
MCHC: 33.3 g/dL (ref 30.0–36.0)
MCV: 87.5 fL (ref 78.0–100.0)
PLATELETS: 200 10*3/uL (ref 150–400)
RBC: 3.05 MIL/uL — ABNORMAL LOW (ref 3.87–5.11)
RDW: 15.1 % (ref 11.5–15.5)
WBC: 15.2 10*3/uL — ABNORMAL HIGH (ref 4.0–10.5)

## 2015-03-18 LAB — RENAL FUNCTION PANEL
ALBUMIN: 2.4 g/dL — AB (ref 3.5–5.0)
ANION GAP: 14 (ref 5–15)
BUN: 53 mg/dL — ABNORMAL HIGH (ref 6–20)
CALCIUM: 8.2 mg/dL — AB (ref 8.9–10.3)
CO2: 18 mmol/L — ABNORMAL LOW (ref 22–32)
Chloride: 112 mmol/L — ABNORMAL HIGH (ref 101–111)
Creatinine, Ser: 5.17 mg/dL — ABNORMAL HIGH (ref 0.44–1.00)
GFR calc non Af Amer: 7 mL/min — ABNORMAL LOW (ref >60)
GFR, EST AFRICAN AMERICAN: 8 mL/min — AB (ref >60)
Glucose, Bld: 110 mg/dL — ABNORMAL HIGH (ref 65–99)
PHOSPHORUS: 4.9 mg/dL — AB (ref 2.5–4.6)
Potassium: 4.4 mmol/L (ref 3.5–5.1)
Sodium: 144 mmol/L (ref 135–145)

## 2015-03-18 LAB — MAGNESIUM: MAGNESIUM: 1.1 mg/dL — AB (ref 1.7–2.4)

## 2015-03-18 LAB — BASIC METABOLIC PANEL
ANION GAP: 13 (ref 5–15)
BUN: 53 mg/dL — AB (ref 6–20)
CALCIUM: 7.9 mg/dL — AB (ref 8.9–10.3)
CO2: 21 mmol/L — AB (ref 22–32)
CREATININE: 4.96 mg/dL — AB (ref 0.44–1.00)
Chloride: 110 mmol/L (ref 101–111)
GFR calc non Af Amer: 8 mL/min — ABNORMAL LOW (ref >60)
GFR, EST AFRICAN AMERICAN: 9 mL/min — AB (ref >60)
Glucose, Bld: 125 mg/dL — ABNORMAL HIGH (ref 65–99)
POTASSIUM: 4.6 mmol/L (ref 3.5–5.1)
Sodium: 144 mmol/L (ref 135–145)

## 2015-03-18 LAB — VANCOMYCIN, RANDOM: VANCOMYCIN RM: 11 ug/mL

## 2015-03-18 LAB — TROPONIN I: Troponin I: 0.12 ng/mL — ABNORMAL HIGH (ref <0.031)

## 2015-03-18 LAB — UREA NITROGEN, URINE: Urea Nitrogen, Ur: 304 mg/dL

## 2015-03-18 MED ORDER — AMIODARONE HCL IN DEXTROSE 360-4.14 MG/200ML-% IV SOLN
INTRAVENOUS | Status: AC
  Administered 2015-03-18: 60 mg/h via INTRAVENOUS
  Filled 2015-03-18: qty 200

## 2015-03-18 MED ORDER — AMIODARONE HCL IN DEXTROSE 360-4.14 MG/200ML-% IV SOLN
60.0000 mg/h | INTRAVENOUS | Status: AC
  Administered 2015-03-18 (×2): 60 mg/h via INTRAVENOUS
  Filled 2015-03-18: qty 200

## 2015-03-18 MED ORDER — FUROSEMIDE 10 MG/ML IJ SOLN
160.0000 mg | Freq: Four times a day (QID) | INTRAMUSCULAR | Status: DC
Start: 2015-03-18 — End: 2015-03-20
  Administered 2015-03-18 – 2015-03-20 (×5): 160 mg via INTRAVENOUS
  Filled 2015-03-18 (×9): qty 16

## 2015-03-18 MED ORDER — CETYLPYRIDINIUM CHLORIDE 0.05 % MT LIQD
7.0000 mL | Freq: Two times a day (BID) | OROMUCOSAL | Status: DC
  Administered 2015-03-18 – 2015-03-25 (×8): 7 mL via OROMUCOSAL

## 2015-03-18 MED ORDER — AMIODARONE HCL IN DEXTROSE 360-4.14 MG/200ML-% IV SOLN
30.0000 mg/h | INTRAVENOUS | Status: DC
  Administered 2015-03-19 – 2015-03-25 (×11): 30 mg/h via INTRAVENOUS
  Filled 2015-03-18 (×13): qty 200

## 2015-03-18 MED ORDER — VANCOMYCIN HCL 10 G IV SOLR
1500.0000 mg | INTRAVENOUS | Status: DC
  Administered 2015-03-18: 1500 mg via INTRAVENOUS
  Filled 2015-03-18 (×2): qty 1500

## 2015-03-18 MED ORDER — ACETAMINOPHEN 325 MG PO TABS
650.0000 mg | ORAL_TABLET | ORAL | Status: DC | PRN
  Administered 2015-03-18: 650 mg via ORAL
  Filled 2015-03-18: qty 2

## 2015-03-18 MED ORDER — NEBIVOLOL HCL 5 MG PO TABS
5.0000 mg | ORAL_TABLET | Freq: Every day | ORAL | Status: DC
  Administered 2015-03-18: 5 mg via ORAL
  Filled 2015-03-18 (×3): qty 1

## 2015-03-18 MED ORDER — AMIODARONE LOAD VIA INFUSION
150.0000 mg | Freq: Once | INTRAVENOUS | Status: AC
  Administered 2015-03-18: 150 mg via INTRAVENOUS
  Filled 2015-03-18: qty 83.34

## 2015-03-18 NOTE — Progress Notes (Signed)
Patient c/o shortness of breath PRN neb given, and already received lasix. I notified RT and  MD order for ABG waiting to be completed and will follow up results with MD.

## 2015-03-18 NOTE — Progress Notes (Signed)
Pt heart rate increasing above 150. Notified CCM, received order for amiodarone. Consuelo Pandy RN

## 2015-03-18 NOTE — Progress Notes (Addendum)
S:  Called by RN regarding intermittent runs SVT,  pt reports headache.  Denies chest pain / SOB.   O: Blood pressure 126/64, pulse 113, temperature 98.6 F (37 C), temperature source Oral, resp. rate 22, height 5\' 2"  (1.575 m), weight 192 lb 7.4 oz (87.3 kg), SpO2 97 %.  A:  PSVT - intermittent short runs noted per RN   P:  Obtain EKG  Re-assess electrolytes  Restart home Bystolic at reduced dose  Tylenol for headache   Noe Gens, NP-C [facility] Pgr: [phone] or if no answer [phone] 03/18/2015, 12:10 PM

## 2015-03-18 NOTE — Progress Notes (Signed)
Pharmacy Antibiotic Follow-up Note  Stacey Zamora is a 80 y.o. year-old female admitted on 03/14/2015.  The patient is currently on day 3 of vancomycin for PNA.  Assessment/Plan: Random vancomycin level 11 after one dose, below goal of 15-20.  Per renal pt isn't good candidate for HD.  For now will assume HD will not begin.  Will start vancomycin 1500mg  IV Q48H for calculated trough of ~15 though initial level was not at steady state and may accumulate if vanc needed >4 doses.  Will continue to monitor.  Temp (24hrs), Avg:98 F (36.7 C), Min:97.4 F (36.3 C), Max:98.6 F (37 C)   Recent Labs Lab 03/07/2015 2321 03/17/15 0454 03/18/15 0340  WBC 11.1* 11.3* 15.2*    Recent Labs Lab 03/09/2015 2321 03/16/15 1323 03/17/15 0454 03/18/15 0340  CREATININE 6.41* 5.98* 5.55* 5.17*   Estimated Creatinine Clearance: 9.4 mL/min (by C-G formula based on Cr of 5.17).    Allergies  Allergen Reactions  . Eggs Or Egg-Derived Products Hives  . Morphine And Related Itching and Other (See Comments)    Crawling sensations  . Vicodin [Hydrocodone-Acetaminophen] Itching and Other (See Comments)    Crawling sensations  . Banana Other (See Comments)    Pt does not eat bananas  . Levaquin [Levofloxacin] Other (See Comments)    Hallucinations: Pt was to take 10 day course, but began to hallucinate after day 8.  . Shellfish Allergy Other (See Comments)    Pt doesn't eat SeaFood at all. No Fish, no Shrimp, etc      Thank you for allowing pharmacy to be a part of this patient's care.  Wynona Neat, PharmD, BCPS   03/18/2015 5:06 AM

## 2015-03-18 NOTE — Progress Notes (Signed)
S:  Called by RN regarding bright red blod per rectum - approx 60 ml / loose.  On sq heparin.  RN reports hemodynamically stable.    O: Blood pressure 135/113, pulse 138, temperature 98.6 F (37 C), temperature source Oral, resp. rate 23, height 5\' 2"  (1.575 m), weight 192 lb 7.4 oz (87.3 kg), SpO2 95 %.   CBC    Component Value Date/Time   WBC 15.2* 03/18/2015 0340   RBC 3.05* 03/18/2015 0340   HGB 8.9* 03/18/2015 0340   HCT 26.7* 03/18/2015 0340   PLT 200 03/18/2015 0340   MCV 87.5 03/18/2015 0340   MCH 29.2 03/18/2015 0340   MCHC 33.3 03/18/2015 0340   RDW 15.1 03/18/2015 0340   LYMPHSABS 1.9 04/03/2015 2321   MONOABS 0.6 03/05/2015 2321   EOSABS 0.1 03/23/2015 2321   BASOSABS 0.1 03/12/2015 2321     A:  Bloody BM - suspect hemorrhoids   P: Hemoccult stool with next BM Hold heparin  SCD's for dvt prophylaxis Repeat CBC in am   Noe Gens, NP-C [facility] Pgr: [phone] or if no answer [phone] 03/18/2015, 1:37 PM

## 2015-03-18 NOTE — Progress Notes (Signed)
eLink Physician-Brief Progress Note Patient Name: Stacey Zamora DOB: Nov 06, 1935 MRN: [id number]   Date of Service  03/18/2015  HPI/Events of Note  AFIB with RVR. Ventricular rate = 150's to 160's. K+ = 4.6 today. New onset?  eICU Interventions  Will order: 1. Magnesium level now.  2. Amiodarone IV bolus and infusion. 3. Cycle Troponin.      Intervention Category Major Interventions: Arrhythmia - evaluation and management  Zarina Pe Eugene 03/18/2015, 5:15 PM

## 2015-03-18 NOTE — Progress Notes (Signed)
Pt had bloody bowel movement, Notified Noe Gens NP. Consuelo Pandy RN

## 2015-03-18 NOTE — Progress Notes (Signed)
[facility] Progress Note   Subjective: diuresing, 1700 out yest and 850 so far today. Breathing stable, no c/o's. Had SVT and CCM resumed bystolic. Had bloody BM just now per RN.  Is on SQ hep for DVT proph.   Filed Vitals:   03/18/15 0328 03/18/15 0747 03/18/15 0834 03/18/15 1203  BP: 100/50 126/64  135/113  Pulse: 121 113  138  Temp: 98.1 F (36.7 C) 98.6 F (37 C)    TempSrc: Oral Oral    Resp: '25 22  23  '$ Height:      Weight: 87.3 kg (192 lb 7.4 oz)     SpO2: 94% 98% 97% 95%    Inpatient medications: . antiseptic oral rinse  7 mL Mouth Rinse BID  . furosemide  160 mg Intravenous Q6H  . heparin subcutaneous  5,000 Units Subcutaneous 3 times per day  . insulin aspart  0-9 Units Subcutaneous 6 times per day  . ipratropium-albuterol  3 mL Nebulization Q6H  . multivitamin  1 tablet Oral BID  . nebivolol  5 mg Oral Daily  . pantoprazole (PROTONIX) IV  40 mg Intravenous Q24H  . piperacillin-tazobactam (ZOSYN)  IV  2.25 g Intravenous 3 times per day  . vancomycin  1,500 mg Intravenous Q48H   .  sodium bicarbonate  infusion 1000 mL 40 mL/hr at 03/18/15 1203   acetaminophen, albuterol, docusate sodium  Exam: Lethargic , pleasant, responsive +JVD Chest absent BS R base 1/2 up, L rales at the base only RRR no RG ABd obese soft ntnd no ascites Ext 1+ edema Neuro sleepy, arouses easily, answers questions, mostly oriented  DateCreat eGFRCKD 2008-20101.10- 1.4037- 47Stage III 20120.9- 2.3220- 60Stage III- IV Oct 20161.54- 2.6416- 31Stage IV Jan 126.416 Jan 13 20165.986   UA neg protein, hazy, yellow, no rbc/ wbc CXR dense RLL changes and today's xray w new superimposed pulm edema Renal US 7.6 and 7.8 cm kidneys, normal echo no hydro      Assessment: 1 Advanced  CKD stage 5 - atrophic kidneys < 8cm, long hx HTN, never saw nephrologist. Last [place] in 2012 kidneys were 10 cm. Is uremic, needs dialysis.  Plan TDC per [facility] tomorrow and initiation of HD.  2 SOB - better, cont lasix IV 3 PNA on vanc/ zosyn 4 HTN restarted Bystolic 5 Hx breast cancer 6 Vol excess diuresing 7 Hyperkalemia resolved 8 DM2 on oral meds only at home 9 SVT per CCM 10 Bloody stool - just now. I dc'd SQ hep   Plan - [facility] consulted for [facility] tomorrow, and then perm access later when more stable. Plan first HD tomorrow.    Kelly Splinter MD [facility] pager [phone]    cell [phone] 03/18/2015, 1:19 PM    Recent Labs Lab 03/16/15 1323 03/17/15 0454 03/18/15 0340  NA 147* 143 144  K 5.3* 4.7 4.4  CL 117* 115* 112*  CO2 16* 17* 18*  GLUCOSE 139* 114* 110*  BUN 64* 53* 53*  CREATININE 5.98* 5.55* 5.17*  CALCIUM 8.7* 8.1* 8.2*  PHOS  --   --  4.9*    Recent Labs Lab 03/11/2015 2321 03/18/15 0340  AST 31  --   ALT 11*  --   ALKPHOS 52  --   BILITOT 0.7  --   PROT 7.3  --   ALBUMIN 3.6 2.4*    Recent Labs Lab 03/27/2015 2321 03/17/15 0454 03/18/15 0340  WBC 11.1* 11.3* 15.2*  NEUTROABS 8.5*  --   --  HGB 9.8* 8.8* 8.9*  HCT 30.7* 27.9* 26.7*  MCV 89.5 92.7 87.5  PLT 279 221 200

## 2015-03-18 NOTE — Progress Notes (Signed)
PULMONARY / CRITICAL CARE MEDICINE   Name: Stacey Zamora MRN: [id number] DOB: [birth year]    ADMISSION DATE:  03/29/2015 CONSULTATION DATE:  1/14/207  REFERRING MD:  Dr. Broadus [name], [facility]  CHIEF COMPLAINT:  Hallucinations  HISTORY OF PRESENT ILLNESS:   80 yo female was treated for pneumonia as an outpatient.  She started getting hallucinations from he antibiotics, and her PCP advised she be admitted.  She was taking levaquin.  She was still having cough with brown sputum and feeling short of breath.  She was noted to have worsening renal function and nephrology consulted.  she was taking NSAIDs and hyzaar as outpatient.  She was started on vancomycin and zosyn for pneumonia.  Her breathing got worse, and she was noted to have wheezing.  SUBJECTIVE: Family and nurse in room. Nurse notes chest clearer than arrival- no longer wheezing. Pt says hungry, denies dyspnea, pain, only light cough.   VITAL SIGNS: BP 126/64 mmHg  Pulse 113  Temp(Src) 98.6 F (37 C) (Oral)  Resp 22  Ht 5\' 2"  (1.575 m)  Wt 87.3 kg (192 lb 7.4 oz)  BMI 35.19 kg/m2  SpO2 97%  HEMODYNAMICS:    VENTILATOR SETTINGS:    INTAKE / OUTPUT: I/O last 3 completed shifts: In: 3520 [P.O.:10; I.V.:1750; IV Piggyback:1760] Out: 1710 [Urine:1710]  PHYSICAL EXAMINATION: General:  Somnolent, woke to voice, obese Neuro:  Wakes up easily, follows commands, moves extremities HEENT:  Pupils reactive, dry mucosa Cardiovascular:  Regular, tachycardic Lungs:  Decreased BS on Rt > Lt , no crackles, no wheeze Abdomen:  Soft, non tender Musculoskeletal:  trace+ edema Skin:  No rashes  LABS:  BMET  Recent Labs Lab 03/16/15 1323 03/17/15 0454 03/18/15 0340  NA 147* 143 144  K 5.3* 4.7 4.4  CL 117* 115* 112*  CO2 16* 17* 18*  BUN 64* 53* 53*  CREATININE 5.98* 5.55* 5.17*  GLUCOSE 139* 114* 110*    Electrolytes  Recent Labs Lab 03/16/15 1323 03/17/15 0454 03/18/15 0340  CALCIUM 8.7* 8.1* 8.2*  PHOS  --   --   4.9*    CBC  Recent Labs Lab 03/27/2015 2321 03/17/15 0454 03/18/15 0340  WBC 11.1* 11.3* 15.2*  HGB 9.8* 8.8* 8.9*  HCT 30.7* 27.9* 26.7*  PLT 279 221 200    Coag's  Recent Labs Lab 03/16/15 0255  APTT 29  INR 1.26    Sepsis Markers  Recent Labs Lab 03/16/15 0255  03/16/15 1850 03/16/15 2130 03/16/15 2352  LATICACIDVEN  --   < > 2.4* 2.5* 3.1*  PROCALCITON 3.69  --   --   --   --   < > = values in this interval not displayed.  ABG  Recent Labs Lab 03/17/15 1020 03/17/15 1800 03/18/15 0134  PHART 7.304* 7.362 7.384  PCO2ART 27.2* 26.3* 28.6*  PO2ART 60.1* 65.2* 62.6*    Liver Enzymes  Recent Labs Lab 03/13/2015 2321 03/18/15 0340  AST 31  --   ALT 11*  --   ALKPHOS 52  --   BILITOT 0.7  --   ALBUMIN 3.6 2.4*    Cardiac Enzymes No results for input(s): TROPONINI, PROBNP in the last 168 hours.  Glucose  Recent Labs Lab 03/17/15 1225 03/17/15 1742 03/17/15 1941 03/17/15 2306 03/18/15 0327 03/18/15 0816  GLUCAP 96 114* 112* 117* 118* 116*    Imaging I reviewed CXR image from 1/14- R pneumonia  STUDIES:  1/13 Renal u/s >> no hydronephrosis  CULTURES: 1/13 Legionella >> 1/13 Pneumococcal >>  1/13 Blood >>  1/13 Influenza >>   ANTIBIOTICS: 1/13 Zosyn >> 1/13 Vancomycin >>  SIGNIFICANT EVENTS: 1/12 Admit 1/13 Nephrology consulted 1/14 To ICU  LINES/TUBES:  DISCUSSION: 80 yo female with dyspnea, productive cough from pneumonia.  Admitted with hallucination from outpatient levaquin therapy.  Now with acute on chronic renal failure.  ASSESSMENT / PLAN:  PULMONARY A: CAP on vanc/zosyn 1/13>> Acute hypoxic respiratory failure from PNA. Hx of Asthma. P:   Oxygen to keep SpO2 > 92% BiPAP prn F/u CXR Prn BDs Continue current abx pending cultures  CARDIOVASCULAR A:  Sepsis from PNA. Hx of HTN, HLD. P:  Monitor hemodynamics Hold zocor, bystolic, hyzaar  RENAL A:   AKI >> baseline creatinine 1.54 from October  2016. Creat slightly improved overnight. Nephrology consulted and will decide about diet, HD. Does not have HD access line yet. CKD. Non gap metabolic acidosis. Lactic acidosis. P:   Place foley Add HCO3 to IV fluid Continue lasix per nephrology Might need HD >> can put HD cath in when she goes to [facility] unless she needs CRRT, and then can be done at [place]:   Nutrition. Hx of GERD. P:   NPO pending Nephrology decision Protonix for SUP  HEMATOLOGIC A:   Anemia of chronic disease and critical illness. P:  F/u CBC SQ heparin for DVT prophylaxis  INFECTIOUS A:   Sepsis from pneumonia. P:   Day 3 vancomycin, zosyn  ENDOCRINE A:   Hx of DM type II with renal complications. P:   SSI Hold outpt actos  NEUROLOGIC A:   Acute encephalopathy 2nd to pneumonia, sepsis, renal failure, respiratory failure. P:   Monitor mental status D/c elavil Hold outpt neurontin, tramadol, trazodone    CD Young, MD [place] 03/18/2015, 10:05 AM Pager:  [phone] After 3pm call: [phone]

## 2015-03-18 NOTE — Consult Note (Signed)
Referred by:  Dr. Jonnie Finner (Nephrology)  Reason for referral: New access  History of Present Illness  Stacey Zamora is a 80 y.o. (1935/08/12) female who presents for evaluation for permanent access.  The patient is right hand dominant.  The patient has not had previous access procedures.  Previous central venous cannulation procedures include: none.  The patient has never had a PPM placed.  The patient is uncertain the etiology of her renal failure.  She was a known CKD Stage 3 that acutely presented as ESRD this admission.  Past Medical History  Diagnosis Date  . Hypertension   . Asthma   . Shortness of breath   . Cancer ([place])     right breast  . GERD (gastroesophageal reflux disease)   . Arthritis   . Pneumonia   . Diabetes mellitus   . HLD (hyperlipidemia)   . CKD (chronic kidney disease), stage III     Past Surgical History  Procedure Laterality Date  . Wrist surgery    . Breast surgery    . Abdominal hysterectomy    . Cholecystectomy    . Carpel tunnel      Social History   Social History  . Marital Status: Divorced    Spouse Name: N/A  . Number of Children: N/A  . Years of Education: N/A   Occupational History  . Not on file.   Social History Main Topics  . Smoking status: Current Every Day Smoker    Types: E-cigarettes  . Smokeless tobacco: Former Systems developer  . Alcohol Use: No  . Drug Use: No  . Sexual Activity: Not Currently    Birth Control/ Protection: Post-menopausal   Other Topics Concern  . Not on file   Social History Narrative    Family History  Problem Relation Age of Onset  . Diabetes Mellitus II Daughter   . Cancer Mother   . Dementia Mother     Current Facility-Administered Medications  Medication Dose Route Frequency Provider Last Rate Last Dose  . acetaminophen (TYLENOL) tablet 650 mg  650 mg Oral Q4H PRN Donita Brooks, NP   650 mg at 03/18/15 1252  . albuterol (PROVENTIL) (2.5 MG/3ML) 0.083% nebulizer solution 2.5 mg  2.5 mg  Nebulization Q4H PRN Ivor Costa, MD   2.5 mg at 03/18/15 0055  . antiseptic oral rinse (CPC / CETYLPYRIDINIUM CHLORIDE 0.05%) solution 7 mL  7 mL Mouth Rinse BID Chesley Mires, MD   7 mL at 03/18/15 1116  . docusate sodium (COLACE) capsule 100 mg  100 mg Oral BID PRN Ivor Costa, MD      . furosemide (LASIX) 160 mg in dextrose 5 % 50 mL IVPB  160 mg Intravenous Q6H Roney Jaffe, MD   160 mg at 03/18/15 1121  . insulin aspart (novoLOG) injection 0-9 Units  0-9 Units Subcutaneous 6 times per day Chesley Mires, MD   0 Units at 03/17/15 1200  . ipratropium-albuterol (DUONEB) 0.5-2.5 (3) MG/3ML nebulizer solution 3 mL  3 mL Nebulization Q6H Chesley Mires, MD   3 mL at 03/18/15 0832  . multivitamin (RENA-VIT) tablet 1 tablet  1 tablet Oral BID Roney Jaffe, MD   1 tablet at 03/17/15 2124  . nebivolol (BYSTOLIC) tablet 5 mg  5 mg Oral Daily Donita Brooks, NP   5 mg at 03/18/15 1339  . pantoprazole (PROTONIX) injection 40 mg  40 mg Intravenous Q24H Chesley Mires, MD   40 mg at 03/18/15 1252  . piperacillin-tazobactam (ZOSYN)  IVPB 2.25 g  2.25 g Intravenous 3 times per day Ivor Costa, MD 100 mL/hr at 03/18/15 1339 2.25 g at 03/18/15 1339  . vancomycin (VANCOCIN) 1,500 mg in sodium chloride 0.9 % 500 mL IVPB  1,500 mg Intravenous Q48H Veronda P Bryk, RPH   1,500 mg at 03/18/15 [id number]    Allergies  Allergen Reactions  . Eggs Or Egg-Derived Products Hives  . Morphine And Related Itching and Other (See Comments)    Crawling sensations  . Vicodin [Hydrocodone-Acetaminophen] Itching and Other (See Comments)    Crawling sensations  . Banana Other (See Comments)    Pt does not eat bananas  . Levaquin [Levofloxacin] Other (See Comments)    Hallucinations: Pt was to take 10 day course, but began to hallucinate after day 8.  . Shellfish Allergy Other (See Comments)    Pt doesn't eat SeaFood at all. No Fish, no Shrimp, etc    REVIEW OF SYSTEMS:  (Positives checked otherwise negative)  CARDIOVASCULAR:   [ ]  chest  pain,  [ ]  chest pressure,  [ ]  palpitations,  [x]  shortness of breath when laying flat,  [x]  shortness of breath with exertion,   [ ]  pain in feet when walking,  [ ]  pain in feet when laying flat, [ ]  history of blood clot in veins (DVT),  [ ]  history of phlebitis,  [ ]  swelling in legs,  [ ]  varicose veins  PULMONARY:   [x]  productive cough,  [ ]  asthma,  [ ]  wheezing  NEUROLOGIC:   [ ]  weakness in arms or legs,  [ ]  numbness in arms or legs,  [ ]  difficulty speaking or slurred speech,  [ ]  temporary loss of vision in one eye,  [ ]  dizziness  HEMATOLOGIC:   [ ]  bleeding problems,  [ ]  problems with blood clotting too easily  MUSCULOSKEL:   [ ]  joint pain, [ ]  joint swelling  GASTROINTEST:   [ ]  vomiting blood,  [ ]  blood in stool     GENITOURINARY:   [ ]  burning with urination,  [ ]  blood in urine  PSYCHIATRIC:   [ ]  history of major depression  INTEGUMENTARY:   [ ]  rashes,  [ ]  ulcers  CONSTITUTIONAL:   [ ]  fever,  [ ]  chills   Physical Examination  Filed Vitals:   03/18/15 0328 03/18/15 0747 03/18/15 0834 03/18/15 1203  BP: 100/50 126/64  135/113  Pulse: 121 113  138  Temp: 98.1 F (36.7 C) 98.6 F (37 C)    TempSrc: Oral Oral    Resp: 25 22  23   Height:      Weight: 192 lb 7.4 oz (87.3 kg)     SpO2: 94% 98% 97% 95%   Body mass index is 35.19 kg/(m^2).  General: A&O x 3, WD, Mildly Obese,   Head: [place]/AT  Ear/Nose/Throat: Hearing grossly intact, nares w/o erythema or drainage, oropharynx w/o Erythema/Exudate, Mallampati score: 3  Eyes: PERRL, EOMI  Neck: Supple, no nuchal rigidity, no palpable LAD  Pulmonary: Sym exp, muffle breath sounds, some upper airway sounds, no rales, rhonchi, & wheezing  Cardiac: RRR, Nl S1, S2, no Murmurs, rubs or gallops  Vascular: Vessel Right Left  Radial Palpable Palpable  Ulnar Not Palpable Not Palpable  Brachial Palpable Palpable  Carotid Palpable, without bruit Palpable, without bruit  Aorta  Not palpable N/A  Femoral Faintly Palpable Faintly Palpable  Popliteal Not palpable Not palpable  PT Not Palpable Not Palpable  DP Not  Palpable Not Palpable   Gastrointestinal: soft, NTND, -G/R, - HSM, - masses, - CVAT B  Musculoskeletal: M/S 5/5 throughout , Extremities without ischemic changes , bilateral feet warm without palpable pedal pulses  Neurologic: CN 2-12 intact  Grossly, Pain and light touch intact in extremities , Motor exam as listed above  Psychiatric: Judgment intact, Mood & affect appropriate for pt's clinical situation  Dermatologic: See M/S exam for extremity exam, no rashes otherwise noted  Lymph : No Cervical, Axillary, or Inguinal lymphadenopathy    Laboratory: CBC:    Component Value Date/Time   WBC 15.2* 03/18/2015 0340   RBC 3.05* 03/18/2015 0340   HGB 8.9* 03/18/2015 0340   HCT 26.7* 03/18/2015 0340   PLT 200 03/18/2015 0340   MCV 87.5 03/18/2015 0340   MCH 29.2 03/18/2015 0340   MCHC 33.3 03/18/2015 0340   RDW 15.1 03/18/2015 0340   LYMPHSABS 1.9 04/01/2015 2321   MONOABS 0.6 03/10/2015 2321   EOSABS 0.1 03/19/2015 2321   BASOSABS 0.1 03/14/2015 2321    BMP:    Component Value Date/Time   NA 144 03/18/2015 0340   K 4.4 03/18/2015 0340   CL 112* 03/18/2015 0340   CO2 18* 03/18/2015 0340   GLUCOSE 110* 03/18/2015 0340   BUN 53* 03/18/2015 0340   CREATININE 5.17* 03/18/2015 0340   CALCIUM 8.2* 03/18/2015 0340   GFRNONAA 7* 03/18/2015 0340   GFRAA 8* 03/18/2015 0340    Coagulation: Lab Results  Component Value Date   INR 1.26 03/16/2015   No results found for: PTT  Lipids:    Component Value Date/Time   CHOL  02/18/2009 0042    107        ATP III CLASSIFICATION:  <200     mg/dL   Desirable  200-239  mg/dL   Borderline High  >=240    mg/dL   High          TRIG 117 02/18/2009 0042   HDL 49 02/18/2009 0042   CHOLHDL 2.2 02/18/2009 0042   VLDL 23 02/18/2009 0042   LDLCALC  02/18/2009 0042    35        Total  Cholesterol/HDL:CHD Risk Coronary Heart Disease Risk Table                     Men   Women  1/2 Average Risk   3.4   3.3  Average Risk       5.0   4.4  2 X Average Risk   9.6   7.1  3 X Average Risk  23.4   11.0        Use the calculated Patient Ratio above and the CHD Risk Table to determine the patient's CHD Risk.        ATP III CLASSIFICATION (LDL):  <100     mg/dL   Optimal  100-129  mg/dL   Near or Above                    Optimal  130-159  mg/dL   Borderline  160-189  mg/dL   High  >190     mg/dL   Very High   Radiology: Dg Chest 2 View  03/17/2015  CLINICAL DATA:  Wheezing and shortness of breath today. History of hypertension and asthma. EXAM: CHEST  2 VIEW COMPARISON:  03/29/2015 FINDINGS: Extensive right lung consolidation, most confluent the right lung base, is similar to the previous day's study. Left lung is  essentially clear. No pneumothorax. Cardiac silhouette is normal in size. No mediastinal or left hilar mass. Right hilum is obscured. No convincing pleural effusion. IMPRESSION: Extensive right lung consolidation most evident in the right lower lobe, consistent with pneumonia. No significant change from the prior chest radiograph. Left lung remains clear. Electronically Signed   By: Lajean Manes M.D.   On: 03/17/2015 10:18   03/16/15  US Renal: No acute findings. No hydronephrosis.   Medical Decision Making  MICHEA CREES is a 80 y.o. female who presents with Acute renal failure with baseline chronic kidney disease stage 3, R sided Pneumonia   I have reviewed labs and renal ultrasound, both which are consistent with ESRD.  Agree pt unlikely to recover renal function given extensive renal atrophy.  Will arrange for [facility] placement tomorrow.  Pt still considering permanent access placement.  Awaiting vein mapping to determine options.  I had an extensive discussion with this patient in regards to the nature of access surgery, including risk, benefits, and  alternatives.    The patient is aware the risks of tunneled dialysis catheter placement include but are not limited to: bleeding, infection, central venous injury, pneumothorax, possible venous stenosis, possible malpositioning in the venous system, and possible infections related to long-term catheter presence.   The patient has agreed to proceed with the above procedure which will be scheduled tomorrow with Dr. Oneida Alar.Adele Barthel, MD [facility] Office: [phone] Pager: [phone]  03/18/2015, 1:46 PM

## 2015-03-18 NOTE — Progress Notes (Signed)
PRELIMINARY RESULTS  Upper Extremity Vein Map    Right Cephalic  Segment Diameter Depth Comment  1. Axilla 2.94mm 8.76mm   2. Mid upper arm 3.35mm 10.43mm branch  3. Above AC 3.73mm 7.46mm   4. In [facility] 4.43mm 4.8mm branch  5. Below AC 3.40mm 9.77mm branch  6. Mid forearm 2.66mm 7.65mm branch  7. Wrist 2.22mm mm    mm mm    mm mm    mm mm    Left Cephalic  Segment Diameter Depth Comment  1. Axilla 2.45mm 15.62mm   2. Mid upper arm 4.75mm 8.8mm   3. Above AC 3.41mm 8.8mm branch  4. In [facility] 3.49mm mm thrombus  5. Below AC 2.76mm 14.53mm   6. Mid forearm 2.3mm 7.28mm branch  7. Wrist 2.35mm mm    mm mm    mm mm    mm mm     Landry Mellow, RDMS, RVT 03/18/2015

## 2015-03-18 NOTE — Progress Notes (Signed)
Called Dr Annamaria Boots to report pt having runs of V-tach. He stated he would consult critical care to consult. Consuelo Pandy RN

## 2015-03-19 ENCOUNTER — Encounter (HOSPITAL_COMMUNITY): Payer: Self-pay | Admitting: Certified Registered Nurse Anesthetist

## 2015-03-19 ENCOUNTER — Inpatient Hospital Stay (HOSPITAL_COMMUNITY): Payer: Medicare Other | Admitting: Certified Registered Nurse Anesthetist

## 2015-03-19 ENCOUNTER — Other Ambulatory Visit: Payer: Self-pay | Admitting: *Deleted

## 2015-03-19 ENCOUNTER — Encounter (HOSPITAL_COMMUNITY): Admission: EM | Disposition: E | Payer: Self-pay | Source: Home / Self Care | Attending: Internal Medicine

## 2015-03-19 ENCOUNTER — Inpatient Hospital Stay (HOSPITAL_COMMUNITY): Payer: Medicare Other

## 2015-03-19 DIAGNOSIS — G9340 Encephalopathy, unspecified: Secondary | ICD-10-CM | POA: Insufficient documentation

## 2015-03-19 DIAGNOSIS — N179 Acute kidney failure, unspecified: Secondary | ICD-10-CM | POA: Insufficient documentation

## 2015-03-19 DIAGNOSIS — N186 End stage renal disease: Secondary | ICD-10-CM

## 2015-03-19 DIAGNOSIS — J810 Acute pulmonary edema: Secondary | ICD-10-CM

## 2015-03-19 DIAGNOSIS — Z4931 Encounter for adequacy testing for hemodialysis: Secondary | ICD-10-CM

## 2015-03-19 HISTORY — PX: INSERTION OF DIALYSIS CATHETER: SHX1324

## 2015-03-19 HISTORY — PX: AV FISTULA PLACEMENT: SHX1204

## 2015-03-19 LAB — BASIC METABOLIC PANEL
ANION GAP: 15 (ref 5–15)
BUN: 55 mg/dL — ABNORMAL HIGH (ref 6–20)
CALCIUM: 7.7 mg/dL — AB (ref 8.9–10.3)
CO2: 19 mmol/L — ABNORMAL LOW (ref 22–32)
CREATININE: 5.07 mg/dL — AB (ref 0.44–1.00)
Chloride: 109 mmol/L (ref 101–111)
GFR, EST AFRICAN AMERICAN: 9 mL/min — AB (ref >60)
GFR, EST NON AFRICAN AMERICAN: 7 mL/min — AB (ref >60)
GLUCOSE: 91 mg/dL (ref 65–99)
Potassium: 4.1 mmol/L (ref 3.5–5.1)
Sodium: 143 mmol/L (ref 135–145)

## 2015-03-19 LAB — CBC
HCT: 25.7 % — ABNORMAL LOW (ref 36.0–46.0)
HCT: 24 % — ABNORMAL LOW (ref 36.0–46.0)
HEMATOCRIT: 22.1 % — AB (ref 36.0–46.0)
HEMOGLOBIN: 8.6 g/dL — AB (ref 12.0–15.0)
HEMOGLOBIN: 7.5 g/dL — AB (ref 12.0–15.0)
HEMOGLOBIN: 8.3 g/dL — AB (ref 12.0–15.0)
MCH: 28.4 pg (ref 26.0–34.0)
MCH: 28.8 pg (ref 26.0–34.0)
MCH: 29.1 pg (ref 26.0–34.0)
MCHC: 33.5 g/dL (ref 30.0–36.0)
MCHC: 33.9 g/dL (ref 30.0–36.0)
MCHC: 34.6 g/dL (ref 30.0–36.0)
MCV: 84.8 fL (ref 78.0–100.0)
MCV: 85 fL (ref 78.0–100.0)
MCV: 84.2 fL (ref 78.0–100.0)
PLATELETS: 191 10*3/uL (ref 150–400)
PLATELETS: 179 10*3/uL (ref 150–400)
Platelets: 166 10*3/uL (ref 150–400)
RBC: 3.03 MIL/uL — AB (ref 3.87–5.11)
RBC: 2.6 MIL/uL — ABNORMAL LOW (ref 3.87–5.11)
RBC: 2.85 MIL/uL — AB (ref 3.87–5.11)
RDW: 15 % (ref 11.5–15.5)
RDW: 14.9 % (ref 11.5–15.5)
RDW: 14.9 % (ref 11.5–15.5)
WBC: 17.1 10*3/uL — ABNORMAL HIGH (ref 4.0–10.5)
WBC: 16.9 10*3/uL — ABNORMAL HIGH (ref 4.0–10.5)
WBC: 19.3 10*3/uL — AB (ref 4.0–10.5)

## 2015-03-19 LAB — GLUCOSE, CAPILLARY
GLUCOSE-CAPILLARY: 84 mg/dL (ref 65–99)
GLUCOSE-CAPILLARY: 116 mg/dL — AB (ref 65–99)
Glucose-Capillary: 98 mg/dL (ref 65–99)
Glucose-Capillary: 96 mg/dL (ref 65–99)
Glucose-Capillary: 80 mg/dL (ref 65–99)
Glucose-Capillary: 102 mg/dL — ABNORMAL HIGH (ref 65–99)

## 2015-03-19 LAB — TROPONIN I
TROPONIN I: 0.49 ng/mL — AB (ref <0.031)
TROPONIN I: 0.53 ng/mL — AB (ref <0.031)

## 2015-03-19 LAB — LEGIONELLA ANTIGEN, URINE

## 2015-03-19 LAB — PARATHYROID HORMONE, INTACT (NO CA): PTH: 85 pg/mL — ABNORMAL HIGH (ref 15–65)

## 2015-03-19 LAB — IRON AND TIBC
IRON: 30 ug/dL (ref 28–170)
Saturation Ratios: 24 % (ref 10.4–31.8)
TIBC: 123 ug/dL — ABNORMAL LOW (ref 250–450)
UIBC: 93 ug/dL

## 2015-03-19 LAB — HEPATITIS B CORE ANTIBODY, IGM: Hep B C IgM: NEGATIVE

## 2015-03-19 LAB — HEPATITIS B SURFACE ANTIBODY,QUALITATIVE: HEP B S AB: NONREACTIVE

## 2015-03-19 LAB — HEPATITIS B SURFACE ANTIGEN: Hepatitis B Surface Ag: NEGATIVE

## 2015-03-19 SURGERY — INSERTION OF DIALYSIS CATHETER
Anesthesia: Monitor Anesthesia Care | Site: Neck

## 2015-03-19 MED ORDER — PROPOFOL 10 MG/ML IV BOLUS
INTRAVENOUS | Status: AC
  Filled 2015-03-19: qty 20

## 2015-03-19 MED ORDER — DEXTROSE 5 % IV SOLN
0.0000 ug/min | INTRAVENOUS | Status: DC
  Administered 2015-03-19: 20 ug/min via INTRAVENOUS
  Administered 2015-03-19: 100 ug/min via INTRAVENOUS
  Administered 2015-03-19: 50 ug/min via INTRAVENOUS
  Administered 2015-03-20 (×2): 25 ug/min via INTRAVENOUS
  Administered 2015-03-21: 15 ug/min via INTRAVENOUS
  Administered 2015-03-22: 5 ug/min via INTRAVENOUS
  Administered 2015-03-22: 25 ug/min via INTRAVENOUS
  Administered 2015-03-22: 20 ug/min via INTRAVENOUS
  Administered 2015-03-22: 35 ug/min via INTRAVENOUS
  Filled 2015-03-19 (×9): qty 1

## 2015-03-19 MED ORDER — CALCIUM CHLORIDE 10 % IV SOLN
INTRAVENOUS | Status: AC
  Filled 2015-03-19: qty 10

## 2015-03-19 MED ORDER — LIDOCAINE HCL (PF) 1 % IJ SOLN
INTRAMUSCULAR | Status: AC
  Filled 2015-03-19: qty 30

## 2015-03-19 MED ORDER — MIDAZOLAM HCL 2 MG/2ML IJ SOLN
INTRAMUSCULAR | Status: AC
  Filled 2015-03-19: qty 2

## 2015-03-19 MED ORDER — ALBUMIN HUMAN 5 % IV SOLN
INTRAVENOUS | Status: AC
  Filled 2015-03-19: qty 250

## 2015-03-19 MED ORDER — DEXMEDETOMIDINE HCL IN NACL 400 MCG/100ML IV SOLN
0.4000 ug/kg/h | INTRAVENOUS | Status: AC
  Administered 2015-03-19: 0.7 ug/kg/h via INTRAVENOUS
  Filled 2015-03-19: qty 100

## 2015-03-19 MED ORDER — LIDOCAINE HCL (PF) 1 % IJ SOLN: 5.0000 mL | INTRAMUSCULAR | Status: DC | PRN

## 2015-03-19 MED ORDER — PHENYLEPHRINE HCL 10 MG/ML IJ SOLN
INTRAMUSCULAR | Status: DC | PRN
  Administered 2015-03-19 (×2): 120 ug via INTRAVENOUS

## 2015-03-19 MED ORDER — CALCIUM GLUCONATE 10 % IV SOLN: 1.0000 g | Freq: Once | INTRAVENOUS | Status: DC

## 2015-03-19 MED ORDER — FENTANYL CITRATE (PF) 100 MCG/2ML IJ SOLN
INTRAMUSCULAR | Status: DC | PRN
  Administered 2015-03-19: 50 ug via INTRAVENOUS

## 2015-03-19 MED ORDER — PIPERACILLIN-TAZOBACTAM 3.375 G IVPB 30 MIN: 3.3750 g | INTRAVENOUS | Status: DC

## 2015-03-19 MED ORDER — SODIUM CHLORIDE 0.9 % IV SOLN: 100.0000 mL | INTRAVENOUS | Status: DC | PRN

## 2015-03-19 MED ORDER — HEPARIN SODIUM (PORCINE) 1000 UNIT/ML DIALYSIS
1000.0000 [IU] | INTRAMUSCULAR | Status: DC | PRN
Start: 2015-03-19 — End: 2015-03-20

## 2015-03-19 MED ORDER — 0.9 % SODIUM CHLORIDE (POUR BTL) OPTIME
TOPICAL | Status: DC | PRN
  Administered 2015-03-19: 1000 mL

## 2015-03-19 MED ORDER — DARBEPOETIN ALFA 200 MCG/0.4ML IJ SOSY
PREFILLED_SYRINGE | INTRAMUSCULAR | Status: AC
  Administered 2015-03-19: 200 ug via INTRAVENOUS
  Filled 2015-03-19: qty 0.4

## 2015-03-19 MED ORDER — CHLORHEXIDINE GLUCONATE 0.12% ORAL RINSE (MEDLINE KIT): 15.0000 mL | Freq: Two times a day (BID) | OROMUCOSAL | Status: DC

## 2015-03-19 MED ORDER — DEXMEDETOMIDINE HCL IN NACL 200 MCG/50ML IV SOLN
0.4000 ug/kg/h | INTRAVENOUS | Status: DC
  Administered 2015-03-19: 0.4 ug/kg/h via INTRAVENOUS
  Administered 2015-03-19: 0.8 ug/kg/h via INTRAVENOUS
  Filled 2015-03-19 (×3): qty 50

## 2015-03-19 MED ORDER — HEPARIN SODIUM (PORCINE) 1000 UNIT/ML IJ SOLN
INTRAMUSCULAR | Status: DC | PRN
  Administered 2015-03-19: 3.4 mL

## 2015-03-19 MED ORDER — CHLORHEXIDINE GLUCONATE 0.12% ORAL RINSE (MEDLINE KIT)
15.0000 mL | Freq: Two times a day (BID) | OROMUCOSAL | Status: DC
  Administered 2015-03-19 – 2015-03-22 (×6): 15 mL via OROMUCOSAL

## 2015-03-19 MED ORDER — ETOMIDATE 2 MG/ML IV SOLN
INTRAVENOUS | Status: DC | PRN
  Administered 2015-03-19: 8 mg via INTRAVENOUS

## 2015-03-19 MED ORDER — ONDANSETRON HCL 4 MG/2ML IJ SOLN
INTRAMUSCULAR | Status: AC
  Filled 2015-03-19: qty 2

## 2015-03-19 MED ORDER — DEXMEDETOMIDINE HCL 200 MCG/2ML IV SOLN
INTRAVENOUS | Status: DC | PRN
  Administered 2015-03-19: 84.4 ug via INTRAVENOUS
  Administered 2015-03-19: 42 ug via INTRAVENOUS

## 2015-03-19 MED ORDER — CALCIUM CHLORIDE 10 % IV SOLN
1.0000 g | Freq: Once | INTRAVENOUS | Status: AC
  Administered 2015-03-19: 0.5 g via INTRAVENOUS

## 2015-03-19 MED ORDER — HEPARIN SODIUM (PORCINE) 1000 UNIT/ML IJ SOLN
INTRAMUSCULAR | Status: AC
  Filled 2015-03-19: qty 1

## 2015-03-19 MED ORDER — MIDAZOLAM HCL 5 MG/5ML IJ SOLN
INTRAMUSCULAR | Status: DC | PRN
  Administered 2015-03-19 (×2): 2 mg via INTRAVENOUS

## 2015-03-19 MED ORDER — SUCCINYLCHOLINE CHLORIDE 20 MG/ML IJ SOLN
INTRAMUSCULAR | Status: DC | PRN
  Administered 2015-03-19: 100 mg via INTRAVENOUS

## 2015-03-19 MED ORDER — FENTANYL CITRATE (PF) 100 MCG/2ML IJ SOLN
50.0000 ug | INTRAMUSCULAR | Status: DC | PRN
  Administered 2015-03-20 – 2015-03-21 (×2): 50 ug via INTRAVENOUS
  Filled 2015-03-19 (×2): qty 2

## 2015-03-19 MED ORDER — FAMOTIDINE IN NACL 20-0.9 MG/50ML-% IV SOLN: 20.0000 mg | Freq: Two times a day (BID) | INTRAVENOUS | Status: DC

## 2015-03-19 MED ORDER — FENTANYL CITRATE (PF) 250 MCG/5ML IJ SOLN
INTRAMUSCULAR | Status: AC
  Filled 2015-03-19: qty 5

## 2015-03-19 MED ORDER — DARBEPOETIN ALFA 200 MCG/0.4ML IJ SOSY
200.0000 ug | PREFILLED_SYRINGE | INTRAMUSCULAR | Status: DC
Start: 2015-03-19 — End: 2015-03-30
  Administered 2015-03-19 – 2015-03-26 (×2): 200 ug via INTRAVENOUS
  Filled 2015-03-19 (×3): qty 0.4

## 2015-03-19 MED ORDER — SODIUM CHLORIDE 0.9 % IV SOLN
10.0000 mg | INTRAVENOUS | Status: DC | PRN
  Administered 2015-03-19: 40 ug/min via INTRAVENOUS

## 2015-03-19 MED ORDER — FENTANYL CITRATE (PF) 100 MCG/2ML IJ SOLN
50.0000 ug | INTRAMUSCULAR | Status: DC | PRN
  Filled 2015-03-19: qty 2

## 2015-03-19 MED ORDER — PENTAFLUOROPROP-TETRAFLUOROETH EX AERO: 1.0000 "application " | INHALATION_SPRAY | CUTANEOUS | Status: DC | PRN

## 2015-03-19 MED ORDER — ALTEPLASE 2 MG IJ SOLR
2.0000 mg | Freq: Once | INTRAMUSCULAR | Status: DC | PRN
  Filled 2015-03-19: qty 2

## 2015-03-19 MED ORDER — SODIUM CHLORIDE 0.9 % IV SOLN
INTRAVENOUS | Status: DC | PRN
  Administered 2015-03-19: 12:00:00 via INTRAVENOUS

## 2015-03-19 MED ORDER — LIDOCAINE-PRILOCAINE 2.5-2.5 % EX CREA
1.0000 "application " | TOPICAL_CREAM | CUTANEOUS | Status: DC | PRN
  Filled 2015-03-19: qty 5

## 2015-03-19 MED ORDER — ALBUMIN HUMAN 5 % IV SOLN
12.5000 g | Freq: Once | INTRAVENOUS | Status: AC
  Administered 2015-03-19: 12.5 g via INTRAVENOUS

## 2015-03-19 MED ORDER — HEPARIN SODIUM (PORCINE) 1000 UNIT/ML IJ SOLN
INTRAMUSCULAR | Status: DC | PRN
  Administered 2015-03-19: 5000 [IU] via INTRAVENOUS

## 2015-03-19 MED ORDER — LIDOCAINE HCL (PF) 1 % IJ SOLN
INTRAMUSCULAR | Status: DC | PRN
  Administered 2015-03-19: 5 mL
  Administered 2015-03-19: 10 mL

## 2015-03-19 MED ORDER — DEXMEDETOMIDINE HCL IN NACL 200 MCG/50ML IV SOLN
INTRAVENOUS | Status: DC | PRN
  Administered 2015-03-19: 0.7 ug/kg/h via INTRAVENOUS

## 2015-03-19 MED ORDER — DEXMEDETOMIDINE HCL IN NACL 200 MCG/50ML IV SOLN
INTRAVENOUS | Status: AC
  Filled 2015-03-19: qty 50

## 2015-03-19 MED ORDER — ETOMIDATE 2 MG/ML IV SOLN
INTRAVENOUS | Status: DC | PRN
  Administered 2015-03-19 (×2): 6 mg via INTRAVENOUS
  Administered 2015-03-19 (×2): 4 mg via INTRAVENOUS

## 2015-03-19 MED ORDER — ANTISEPTIC ORAL RINSE SOLUTION (CORINZ): 7.0000 mL | Freq: Four times a day (QID) | OROMUCOSAL | Status: DC

## 2015-03-19 MED ORDER — SODIUM CHLORIDE 0.9 % IV SOLN
INTRAVENOUS | Status: DC | PRN
  Administered 2015-03-19: 500 mL

## 2015-03-19 MED ORDER — THROMBIN 20000 UNITS EX SOLR
CUTANEOUS | Status: AC
  Filled 2015-03-19: qty 20000

## 2015-03-19 MED ORDER — STERILE WATER FOR INJECTION IJ SOLN
INTRAMUSCULAR | Status: AC
  Filled 2015-03-19: qty 20

## 2015-03-19 MED ORDER — ANTISEPTIC ORAL RINSE SOLUTION (CORINZ)
7.0000 mL | Freq: Four times a day (QID) | OROMUCOSAL | Status: DC
  Administered 2015-03-19 – 2015-03-22 (×10): 7 mL via OROMUCOSAL

## 2015-03-19 SURGICAL SUPPLY — 49 items
BAG DECANTER FOR FLEXI CONT (MISCELLANEOUS) ×2 IMPLANT
BIOPATCH RED 1 DISK 7.0 (GAUZE/BANDAGES/DRESSINGS) ×3 IMPLANT
CATH EMB 3FR 80CM (CATHETERS) ×1 IMPLANT
CATH PALINDROME RT-P 15FX19CM (CATHETERS) IMPLANT
CATH PALINDROME RT-P 15FX23CM (CATHETERS) ×1 IMPLANT
CATH PALINDROME RT-P 15FX28CM (CATHETERS) IMPLANT
CATH PALINDROME RT-P 15FX55CM (CATHETERS) IMPLANT
CATH STRAIGHT 5FR 65CM (CATHETERS) IMPLANT
CHLORAPREP W/TINT 26ML (MISCELLANEOUS) ×3 IMPLANT
COVER PROBE W GEL 5X96 (DRAPES) ×1 IMPLANT
DECANTER SPIKE VIAL GLASS SM (MISCELLANEOUS) IMPLANT
DRAIN PENROSE 1/2X12 LTX STRL (WOUND CARE) ×1 IMPLANT
DRAPE C-ARM 42X72 X-RAY (DRAPES) ×3 IMPLANT
DRAPE CHEST BREAST 15X10 FENES (DRAPES) ×3 IMPLANT
GAUZE SPONGE 2X2 8PLY STRL LF (GAUZE/BANDAGES/DRESSINGS) ×2 IMPLANT
GAUZE SPONGE 4X4 16PLY XRAY LF (GAUZE/BANDAGES/DRESSINGS) ×2 IMPLANT
GLOVE BIO SURGEON STRL SZ7.5 (GLOVE) ×4 IMPLANT
GLOVE BIOGEL PI IND STRL 6.5 (GLOVE) IMPLANT
GLOVE BIOGEL PI INDICATOR 6.5 (GLOVE) ×6
GLOVE ECLIPSE 6.5 STRL STRAW (GLOVE) ×3 IMPLANT
GLOVE SS BIOGEL STRL SZ 6.5 (GLOVE) IMPLANT
GLOVE SUPERSENSE BIOGEL SZ 6.5 (GLOVE) ×1
GOWN STRL REUS W/ TWL LRG LVL3 (GOWN DISPOSABLE) ×4 IMPLANT
GOWN STRL REUS W/TWL LRG LVL3 (GOWN DISPOSABLE) ×18
KIT BASIN OR (CUSTOM PROCEDURE TRAY) ×2 IMPLANT
KIT ROOM TURNOVER OR (KITS) ×3 IMPLANT
LIQUID BAND (GAUZE/BANDAGES/DRESSINGS) ×2 IMPLANT
NDL 18GX1X1/2 (RX/OR ONLY) (NEEDLE) ×2 IMPLANT
NDL HYPO 25GX1X1/2 BEV (NEEDLE) ×2 IMPLANT
NEEDLE 18GX1X1/2 (RX/OR ONLY) (NEEDLE) ×3 IMPLANT
NEEDLE HYPO 25GX1X1/2 BEV (NEEDLE) IMPLANT
NS IRRIG 1000ML POUR BTL (IV SOLUTION) ×3 IMPLANT
PACK CV ACCESS (CUSTOM PROCEDURE TRAY) ×1 IMPLANT
PAD ARMBOARD 7.5X6 YLW CONV (MISCELLANEOUS) ×6 IMPLANT
SET MICROPUNCTURE 5F STIFF (MISCELLANEOUS) IMPLANT
SPONGE GAUZE 2X2 STER 10/PKG (GAUZE/BANDAGES/DRESSINGS) ×1
SUT ETHILON 3 0 PS 1 (SUTURE) ×3 IMPLANT
SUT PROLENE 6 0 CC (SUTURE) ×1 IMPLANT
SUT PROLENE 7 0 BV 1 (SUTURE) ×1 IMPLANT
SUT VIC AB 3-0 SH 27 (SUTURE) ×6
SUT VIC AB 3-0 SH 27X BRD (SUTURE) IMPLANT
SUT VICRYL 4-0 PS2 18IN ABS (SUTURE) ×4 IMPLANT
SYR 20CC LL (SYRINGE) ×5 IMPLANT
SYR 5ML LL (SYRINGE) ×3 IMPLANT
SYR CONTROL 10ML LL (SYRINGE) ×3 IMPLANT
SYRINGE 10CC LL (SYRINGE) ×3 IMPLANT
TAPE CLOTH SURG 4X10 WHT LF (GAUZE/BANDAGES/DRESSINGS) ×1 IMPLANT
WATER STERILE IRR 1000ML POUR (IV SOLUTION) ×3 IMPLANT
WIRE AMPLATZ SS-J .035X180CM (WIRE) IMPLANT

## 2015-03-19 NOTE — Op Note (Signed)
Procedure: Ultrasound-guided insertion of 23 cm Palindrome catheter, Left Brachial Cephalic AV fistula  Preoperative diagnosis: End-stage renal disease  Postoperative diagnosis: Same  Asst: Christa McClellan RNFA  Anesthesia: Local with IV sedation  Operative findings: 23 cm Palindrome catheter right internal jugular vein  Operative details: After obtaining informed consent, the patient was taken to the operating room. The patient was placed in supine position on the operating room table. After adequate sedation the patient's entire neck and chest were prepped and draped in usual sterile fashion. The patient was placed in Trendelenburg position. Ultrasound was used to identify the patient's right internal jugular vein. This had normal compressibility and respiratory variation. Local anesthesia was infiltrated over the right jugular vein.  Using ultrasound guidance, the right internal jugular vein was successfully cannulated.  A 0.035 J-tipped guidewire was threaded into the right internal jugular vein and into the superior vena cava followed by the inferior vena cava under fluoroscopic guidance.   Next sequential 12 and 14 dilators were placed over the guidewire into the right atrium.  A 16 French dilator with a peel-away sheath was then placed over the guidewire into the right atrium.   The guidewire and dilator were removed. A 23 cm Palindrome catheter was then placed through the peel away sheath into the right atrium.  The catheter was then tunneled subcutaneously, cut to length, and the hub attached. The catheter was noted to flush and draw easily. The catheter was inspected under fluoroscopy and found with its tip to be in the right atrium without any kinks throughout its course. The catheter was sutured to the skin with nylon sutures. The neck insertion site was closed with Vicryl stitch. The catheter was then loaded with concentrated Heparin solution. A dry sterile dressing was applied.  Next,  the left upper extremity was prepped and draped in usual sterile fashion.  A transverse incision was then made near the antecubital crease the left arm. The incision was carried into the subcutaneous tissues down to level of the cephalic vein. The cephalic vein was approximately 3.5 mm in diameter. This was dissected free circumferentially and small side branches ligated and divided between silk ties or clips. Next the brachial artery was dissected free in the medial portion of the incision. The artery was  3-4 mm in diameter. The vessel loops were placed proximal and distal to the planned site of arteriotomy. The patient was given 5000 units of intravenous heparin. After appropriate circulation time, the vessel loops were used to control the artery. A longitudinal opening was made in the brachial artery.  The vein was ligated distally with a 2-0 silk tie. There was recent thrombus within the cephalic vein so a # 3 Fogarty catheter was passed up the vein and a string of clot about 7 cm in length was retrieved.  There was then brisk backbleeding and the vein flushed easily.  The vein was controlled proximally with a fine bulldog clamp. The vein was then swung over to the artery and sewn end of vein to side of artery using a running 7-0 Prolene suture. Just prior to completion of the anastomosis, everything was fore bled back bled and thoroughly flushed. The anastomosis was secured, vessel loops released, and there was a palpable thrill in the fistula immediately. After hemostasis was obtained, the subcutaneous tissues were reapproximated using a running 3-0 Vicryl suture. The skin was then closed with a 4 0 Vicryl subcuticular stitch. Dermabond was applied to the skin incision.  The patient had  a palpable radial pulse at the end of the case.  The patient tolerated procedure well and there were no complications. Instrument sponge and needle counts were correct at the end of the case.  Chest x-ray will be obtained in  the recovery room.  Ruta Hinds, MD [facility] [place] Office: [phone] Pager: [phone]

## 2015-03-19 NOTE — Transfer of Care (Signed)
Immediate Anesthesia Transfer of Care Note  Patient: Stacey Zamora  Procedure(s) Performed: Procedure(s): INSERTION OF DIALYSIS CATHETER RIGHT INTERNAL JUGULAR VEIN (N/A) CREATION OF LEFT UPPER ARM  ARTERIOVENOUS (AV) FISTULA  (Left)  Patient Location: pacu  Anesthesia Type:mac  Level of Consciousness: awake alert cooperative   Airway & Oxygen Therapy: 02 via [place]  Post-op Assessment: no apparent anesthesia complications   Post vital signs: VSS see pacu flowsheet  Last Vitals:  Filed Vitals:   03/06/2015 0915 03/14/2015 1440  BP: 99/67   Pulse: 89   Temp: 36.7 C 36.7 C  Resp: 27     Complications: no apparent anesthesia complications

## 2015-03-19 NOTE — Progress Notes (Signed)
Anesthesiology Note:  Stacey Zamora is a 80 year old female with a history of pneumonia, hypertension, diabetes, and stage III chronic kidney disease who today underwent insertion of a right internal jugular dialysis catheter and placement of a left brachiocephalic AV fistula. The procedure was done under deep sedation. The patient received 4 mg of versed, 50 g of fentanyl, Precedex infusion, 20 mg of IV etomidate,. The procedure lasted approximately one hour. During the procedure she was noted to be hypotensive and required IV phenylephrine infusion.  She was brought to the recovery room and I was called to evaluate her. The patient was lethargic but arousable and followed commands. She remained hypotensive and was given 250 mg of 5% albumin, 1 g of IV calcium chloride, and was started on a phenylephrine infusion. Over the next under 2 hours she became progressively lethargic and became unarousable. Her oxygen saturation was 98-99% on 6 L facemask. She remained hypotensive with blood pressure of 82/40 with a infusion of phenylephrine 30 mcg/m. The decision was made to intubate the patient due to progressive obtundation and probable CO2 narcosis.   The patient was given 8 mg of IV etomidate 100 mg  of succinylcholine was intubated using the portable video laryngoscope. Dried secretions were noted  in the posterior pharynx and in the supraglottic region.   Approximately 30 minutes following intubation while the patient was on FiO2 60% tidal volume 530 cc, respiratory rate of 14, with 5 of PEEP showed pH 7.25 PCO2 52 PO2 240 base deficit -4. The potassium was 4.2 and sodium and glucose were normal.  Approximately 20-30 minutes following intubation the patient became progressively more responsive and was following commands moving all extremities without difficulty. Follow-up chest x-ray showed the ET tube in good position with persistent right-sided pneumonia which was unchanged from the pre-operative  study.  Imression: 80 year old female with stage III kidney disease approaching the need for dialysis. She developed excessive sedation and hypotension following following insertion of a dialysis catheter and left arm AV fistula. There has been marked improvement in mental status following intubation but she remains hypotensive requiring Neo-Synephrine for blood pressure support.  Plan: 1. Consult critical care medicine 2. Continue ventilatory support overnight 3 continue hemodynamic support with Neo-Synephrine   Roberts Gaudy.

## 2015-03-19 NOTE — Progress Notes (Signed)
Patient arrived on unit. Amiodarone, neo, and precedex gtts infusing. Patient is hypertensive with bp of 157/121. Patient is restless and grabbing at ETT. Mitts placed on hands and Neo gtt stopped d/t hypertension. Will continue with current plan of care.

## 2015-03-19 NOTE — Progress Notes (Signed)
Spoke with Dr. Oneida Alar (via circulating nurse) that anesthesia may place central line in the left.

## 2015-03-19 NOTE — Anesthesia Preprocedure Evaluation (Addendum)
Anesthesia Evaluation  Patient identified by MRN, date of birth, ID band Patient awake    Reviewed: Allergy & Precautions, NPO status   Airway Mallampati: I       Dental  (+) Edentulous Upper   Pulmonary shortness of breath, asthma , Current Smoker,     + decreased breath sounds      Cardiovascular hypertension, +CHF   Rhythm:Regular Rate:Normal     Neuro/Psych    GI/Hepatic GERD  ,  Endo/Other  diabetes, Insulin Dependent  Renal/GU Renal InsufficiencyRenal disease     Musculoskeletal  (+) Arthritis ,   Abdominal   Peds  Hematology  (+) anemia ,   Anesthesia Other Findings   Reproductive/Obstetrics                            Anesthesia Physical Anesthesia Plan  ASA: III  Anesthesia Plan: MAC   Post-op Pain Management:    Induction: Intravenous  Airway Management Planned: Natural Airway  Additional Equipment:   Intra-op Plan:   Post-operative Plan:   Informed Consent: I have reviewed the patients History and Physical, chart, labs and discussed the procedure including the risks, benefits and alternatives for the proposed anesthesia with the patient or authorized representative who has indicated his/her understanding and acceptance.     Plan Discussed with:   Anesthesia Plan Comments:         Anesthesia Quick Evaluation

## 2015-03-19 NOTE — Progress Notes (Signed)
Pharmacy Antibiotic Follow-up Note  Stacey Zamora is a 80 y.o. year-old female admitted on 03/20/2015.  The patient is currently on day 3 of vancomycin for PNA. -Noted s/p HD today (BFR 250 for 2.5hrs; 1st HD session).  -random vancomycin level was 11 today at 3:40am and 1500mg  vanc was given at 6:22am  Plan:  -No further vancomycin at this time -Will follow further HD plans    Temp (24hrs), Avg:98.3 F (36.8 C), Min:97.8 F (36.6 C), Max:99.3 F (37.4 C)   Recent Labs Lab 03/09/2015 2321 03/17/15 0454 03/18/15 0340 03/14/2015 0527 03/10/2015 1556  WBC 11.1* 11.3* 15.2* 17.1* 16.9*     Recent Labs Lab 03/16/15 1323 03/17/15 0454 03/18/15 0340 03/18/15 1348 03/17/2015 0527  CREATININE 5.98* 5.55* 5.17* 4.96* 5.07*   Estimated Creatinine Clearance: 9.5 mL/min (by C-G formula based on Cr of 5.07).    Allergies  Allergen Reactions  . Eggs Or Egg-Derived Products Hives  . Morphine And Related Itching and Other (See Comments)    Crawling sensations  . Vicodin [Hydrocodone-Acetaminophen] Itching and Other (See Comments)    Crawling sensations  . Banana Other (See Comments)    Pt does not eat bananas  . Levaquin [Levofloxacin] Other (See Comments)    Hallucinations: Pt was to take 10 day course, but began to hallucinate after day 8.  . Shellfish Allergy Other (See Comments)    Pt doesn't eat SeaFood at all. No Fish, no Shrimp, etc      Thank you for allowing pharmacy to be a part of this patient's care.  Hildred Laser, Pharm D 03/06/2015 7:44 PM

## 2015-03-19 NOTE — Progress Notes (Signed)
Report given to [place], RN in [place] holding.

## 2015-03-19 NOTE — H&P (View-Only) (Signed)
Referred by:  Dr. Jonnie Finner (Nephrology)  Reason for referral: New access  History of Present Illness  Stacey Zamora is a 80 y.o. (05-Mar-1935) female who presents for evaluation for permanent access.  The patient is right hand dominant.  The patient has not had previous access procedures.  Previous central venous cannulation procedures include: none.  The patient has never had a PPM placed.  The patient is uncertain the etiology of her renal failure.  She was a known CKD Stage 3 that acutely presented as ESRD this admission.  Past Medical History  Diagnosis Date  . Hypertension   . Asthma   . Shortness of breath   . Cancer ([place])     right breast  . GERD (gastroesophageal reflux disease)   . Arthritis   . Pneumonia   . Diabetes mellitus   . HLD (hyperlipidemia)   . CKD (chronic kidney disease), stage III     Past Surgical History  Procedure Laterality Date  . Wrist surgery    . Breast surgery    . Abdominal hysterectomy    . Cholecystectomy    . Carpel tunnel      Social History   Social History  . Marital Status: Divorced    Spouse Name: N/A  . Number of Children: N/A  . Years of Education: N/A   Occupational History  . Not on file.   Social History Main Topics  . Smoking status: Current Every Day Smoker    Types: E-cigarettes  . Smokeless tobacco: Former Systems developer  . Alcohol Use: No  . Drug Use: No  . Sexual Activity: Not Currently    Birth Control/ Protection: Post-menopausal   Other Topics Concern  . Not on file   Social History Narrative    Family History  Problem Relation Age of Onset  . Diabetes Mellitus II Daughter   . Cancer Mother   . Dementia Mother     Current Facility-Administered Medications  Medication Dose Route Frequency Provider Last Rate Last Dose  . acetaminophen (TYLENOL) tablet 650 mg  650 mg Oral Q4H PRN Donita Brooks, NP   650 mg at 03/18/15 1252  . albuterol (PROVENTIL) (2.5 MG/3ML) 0.083% nebulizer solution 2.5 mg  2.5 mg  Nebulization Q4H PRN Ivor Costa, MD   2.5 mg at 03/18/15 0055  . antiseptic oral rinse (CPC / CETYLPYRIDINIUM CHLORIDE 0.05%) solution 7 mL  7 mL Mouth Rinse BID Chesley Mires, MD   7 mL at 03/18/15 1116  . docusate sodium (COLACE) capsule 100 mg  100 mg Oral BID PRN Ivor Costa, MD      . furosemide (LASIX) 160 mg in dextrose 5 % 50 mL IVPB  160 mg Intravenous Q6H Roney Jaffe, MD   160 mg at 03/18/15 1121  . insulin aspart (novoLOG) injection 0-9 Units  0-9 Units Subcutaneous 6 times per day Chesley Mires, MD   0 Units at 03/17/15 1200  . ipratropium-albuterol (DUONEB) 0.5-2.5 (3) MG/3ML nebulizer solution 3 mL  3 mL Nebulization Q6H Chesley Mires, MD   3 mL at 03/18/15 0832  . multivitamin (RENA-VIT) tablet 1 tablet  1 tablet Oral BID Roney Jaffe, MD   1 tablet at 03/17/15 2124  . nebivolol (BYSTOLIC) tablet 5 mg  5 mg Oral Daily Donita Brooks, NP   5 mg at 03/18/15 1339  . pantoprazole (PROTONIX) injection 40 mg  40 mg Intravenous Q24H Chesley Mires, MD   40 mg at 03/18/15 1252  . piperacillin-tazobactam (ZOSYN)  IVPB 2.25 g  2.25 g Intravenous 3 times per day Ivor Costa, MD 100 mL/hr at 03/18/15 1339 2.25 g at 03/18/15 1339  . vancomycin (VANCOCIN) 1,500 mg in sodium chloride 0.9 % 500 mL IVPB  1,500 mg Intravenous Q48H Veronda P Bryk, RPH   1,500 mg at 03/18/15 [id number]    Allergies  Allergen Reactions  . Eggs Or Egg-Derived Products Hives  . Morphine And Related Itching and Other (See Comments)    Crawling sensations  . Vicodin [Hydrocodone-Acetaminophen] Itching and Other (See Comments)    Crawling sensations  . Banana Other (See Comments)    Pt does not eat bananas  . Levaquin [Levofloxacin] Other (See Comments)    Hallucinations: Pt was to take 10 day course, but began to hallucinate after day 8.  . Shellfish Allergy Other (See Comments)    Pt doesn't eat SeaFood at all. No Fish, no Shrimp, etc    REVIEW OF SYSTEMS:  (Positives checked otherwise negative)  CARDIOVASCULAR:   [ ]  chest  pain,  [ ]  chest pressure,  [ ]  palpitations,  [x]  shortness of breath when laying flat,  [x]  shortness of breath with exertion,   [ ]  pain in feet when walking,  [ ]  pain in feet when laying flat, [ ]  history of blood clot in veins (DVT),  [ ]  history of phlebitis,  [ ]  swelling in legs,  [ ]  varicose veins  PULMONARY:   [x]  productive cough,  [ ]  asthma,  [ ]  wheezing  NEUROLOGIC:   [ ]  weakness in arms or legs,  [ ]  numbness in arms or legs,  [ ]  difficulty speaking or slurred speech,  [ ]  temporary loss of vision in one eye,  [ ]  dizziness  HEMATOLOGIC:   [ ]  bleeding problems,  [ ]  problems with blood clotting too easily  MUSCULOSKEL:   [ ]  joint pain, [ ]  joint swelling  GASTROINTEST:   [ ]  vomiting blood,  [ ]  blood in stool     GENITOURINARY:   [ ]  burning with urination,  [ ]  blood in urine  PSYCHIATRIC:   [ ]  history of major depression  INTEGUMENTARY:   [ ]  rashes,  [ ]  ulcers  CONSTITUTIONAL:   [ ]  fever,  [ ]  chills   Physical Examination  Filed Vitals:   03/18/15 0328 03/18/15 0747 03/18/15 0834 03/18/15 1203  BP: 100/50 126/64  135/113  Pulse: 121 113  138  Temp: 98.1 F (36.7 C) 98.6 F (37 C)    TempSrc: Oral Oral    Resp: 25 22  23   Height:      Weight: 192 lb 7.4 oz (87.3 kg)     SpO2: 94% 98% 97% 95%   Body mass index is 35.19 kg/(m^2).  General: A&O x 3, WD, Mildly Obese,   Head: [place]/AT  Ear/Nose/Throat: Hearing grossly intact, nares w/o erythema or drainage, oropharynx w/o Erythema/Exudate, Mallampati score: 3  Eyes: PERRL, EOMI  Neck: Supple, no nuchal rigidity, no palpable LAD  Pulmonary: Sym exp, muffle breath sounds, some upper airway sounds, no rales, rhonchi, & wheezing  Cardiac: RRR, Nl S1, S2, no Murmurs, rubs or gallops  Vascular: Vessel Right Left  Radial Palpable Palpable  Ulnar Not Palpable Not Palpable  Brachial Palpable Palpable  Carotid Palpable, without bruit Palpable, without bruit  Aorta  Not palpable N/A  Femoral Faintly Palpable Faintly Palpable  Popliteal Not palpable Not palpable  PT Not Palpable Not Palpable  DP Not  Palpable Not Palpable   Gastrointestinal: soft, NTND, -G/R, - HSM, - masses, - CVAT B  Musculoskeletal: M/S 5/5 throughout , Extremities without ischemic changes , bilateral feet warm without palpable pedal pulses  Neurologic: CN 2-12 intact  Grossly, Pain and light touch intact in extremities , Motor exam as listed above  Psychiatric: Judgment intact, Mood & affect appropriate for pt's clinical situation  Dermatologic: See M/S exam for extremity exam, no rashes otherwise noted  Lymph : No Cervical, Axillary, or Inguinal lymphadenopathy    Laboratory: CBC:    Component Value Date/Time   WBC 15.2* 03/18/2015 0340   RBC 3.05* 03/18/2015 0340   HGB 8.9* 03/18/2015 0340   HCT 26.7* 03/18/2015 0340   PLT 200 03/18/2015 0340   MCV 87.5 03/18/2015 0340   MCH 29.2 03/18/2015 0340   MCHC 33.3 03/18/2015 0340   RDW 15.1 03/18/2015 0340   LYMPHSABS 1.9 03/28/2015 2321   MONOABS 0.6 03/28/2015 2321   EOSABS 0.1 03/29/2015 2321   BASOSABS 0.1 03/11/2015 2321    BMP:    Component Value Date/Time   NA 144 03/18/2015 0340   K 4.4 03/18/2015 0340   CL 112* 03/18/2015 0340   CO2 18* 03/18/2015 0340   GLUCOSE 110* 03/18/2015 0340   BUN 53* 03/18/2015 0340   CREATININE 5.17* 03/18/2015 0340   CALCIUM 8.2* 03/18/2015 0340   GFRNONAA 7* 03/18/2015 0340   GFRAA 8* 03/18/2015 0340    Coagulation: Lab Results  Component Value Date   INR 1.26 03/16/2015   No results found for: PTT  Lipids:    Component Value Date/Time   CHOL  02/18/2009 0042    107        ATP III CLASSIFICATION:  <200     mg/dL   Desirable  200-239  mg/dL   Borderline High  >=240    mg/dL   High          TRIG 117 02/18/2009 0042   HDL 49 02/18/2009 0042   CHOLHDL 2.2 02/18/2009 0042   VLDL 23 02/18/2009 0042   LDLCALC  02/18/2009 0042    35        Total  Cholesterol/HDL:CHD Risk Coronary Heart Disease Risk Table                     Men   Women  1/2 Average Risk   3.4   3.3  Average Risk       5.0   4.4  2 X Average Risk   9.6   7.1  3 X Average Risk  23.4   11.0        Use the calculated Patient Ratio above and the CHD Risk Table to determine the patient's CHD Risk.        ATP III CLASSIFICATION (LDL):  <100     mg/dL   Optimal  100-129  mg/dL   Near or Above                    Optimal  130-159  mg/dL   Borderline  160-189  mg/dL   High  >190     mg/dL   Very High   Radiology: Dg Chest 2 View  03/17/2015  CLINICAL DATA:  Wheezing and shortness of breath today. History of hypertension and asthma. EXAM: CHEST  2 VIEW COMPARISON:  03/04/2015 FINDINGS: Extensive right lung consolidation, most confluent the right lung base, is similar to the previous day's study. Left lung is  essentially clear. No pneumothorax. Cardiac silhouette is normal in size. No mediastinal or left hilar mass. Right hilum is obscured. No convincing pleural effusion. IMPRESSION: Extensive right lung consolidation most evident in the right lower lobe, consistent with pneumonia. No significant change from the prior chest radiograph. Left lung remains clear. Electronically Signed   By: Lajean Manes M.D.   On: 03/17/2015 10:18   03/16/15  US Renal: No acute findings. No hydronephrosis.   Medical Decision Making  Stacey Zamora is a 80 y.o. female who presents with Acute renal failure with baseline chronic kidney disease stage 3, R sided Pneumonia   I have reviewed labs and renal ultrasound, both which are consistent with ESRD.  Agree pt unlikely to recover renal function given extensive renal atrophy.  Will arrange for [facility] placement tomorrow.  Pt still considering permanent access placement.  Awaiting vein mapping to determine options.  I had an extensive discussion with this patient in regards to the nature of access surgery, including risk, benefits, and  alternatives.    The patient is aware the risks of tunneled dialysis catheter placement include but are not limited to: bleeding, infection, central venous injury, pneumothorax, possible venous stenosis, possible malpositioning in the venous system, and possible infections related to long-term catheter presence.   The patient has agreed to proceed with the above procedure which will be scheduled tomorrow with Dr. Oneida Alar.Adele Barthel, MD [facility] Office: [phone] Pager: [phone]  03/18/2015, 1:46 PM

## 2015-03-19 NOTE — Interval H&P Note (Signed)
History and Physical Interval Note:  03/13/2015 12:09 PM  Stanton Kidney  has presented today for surgery, with the diagnosis of ESRD  The various methods of treatment have been discussed with the patient and family. After consideration of risks, benefits and other options for treatment, the patient has consented to  Procedure(s): INSERTION OF DIALYSIS CATHETER (N/A) ARTERIOVENOUS (AV) FISTULA CREATION (N/A) as a surgical intervention .  The patient's history has been reviewed, patient examined, no change in status, stable for surgery.  I have reviewed the patient's chart and labs.  Questions were answered to the patient's satisfaction.     Ruta Hinds

## 2015-03-19 NOTE — Progress Notes (Signed)
PULMONARY / CRITICAL CARE MEDICINE   Name: Stacey Zamora MRN: [id number] DOB: December 22, 1935    ADMISSION DATE:  04/02/2015 CONSULTATION DATE:  03/10/2015  REFERRING MD :  Anesthesiology/ surgery  PRIMARY SERVICE: CCM  HISTORY OF PRESENT ILLNESS:  Patient is intubated. History obtained from anesthesiologist Dr. Linna Caprice and [facility] documentation. Patient is a 80 yo F with a PMHx of HTN, asthma, breast ca, DM, HLD, CKD stage 3 who was found unresponsive after HD cath placement today. Patient was admitted for acute hypoxic respiratory failure from PNA. She underwent procedure for RIJ cath and LUA AVF by vascular surgery this afternoon. As per anesthesiology, patient received Versed 4 mg, Fentanyl 50 mcg, Neo-synephrine 4770 mcg, Precedex 69 mcg, and Etomidate 20 mg during the procedure. She continued to be hypotensive during the procedure. Amount of blood loss is not reported in EPIC. Patient was receiving IV Lasix 160 mg q6 prior to the procedure.  As per anesthesiologist and nursing staff, when patient initially came into the PACU after the procedure she was arousable and following commands, however, was hypotensive with systolic in the [id number]. She was given Neo-synephrine in the PACU and subsequently became more obtunded and then unresponsive. She was also given Albumin 5% 12.5 g and Calcium 1g. Patient was intubated by anesthesiology to protect her airway. She was given Etomidate 8 mg and Succinylcholine 100 mg during the process. No Precedex was administered. ABG was obtained 30 mins after intubation and showed pH 7.25, PCO2 52.6, and PO2 261. Labs were also obtained at this time and revealed Na 142, K 4.2, H/H 8.2/24. Patient was continued on Neo-synephrine 30 mcg/ min for hypotension.   PAST MEDICAL HISTORY :  Past Medical History  Diagnosis Date  . Hypertension   . Asthma   . Shortness of breath   . Cancer ([place])     right breast  . GERD (gastroesophageal reflux disease)   . Arthritis   . Pneumonia   .  Diabetes mellitus   . HLD (hyperlipidemia)   . CKD (chronic kidney disease), stage III    Past Surgical History  Procedure Laterality Date  . Wrist surgery    . Breast surgery    . Abdominal hysterectomy    . Cholecystectomy    . Carpel tunnel     Prior to Admission medications   Medication Sig Start Date End Date Taking? Authorizing Provider  ADVAIR DISKUS 250-50 MCG/DOSE AEPB Inhale 1 puff into the lungs 2 (two) times daily. 11/23/14  Yes Historical Provider, MD  amitriptyline (ELAVIL) 50 MG tablet Take 50 mg by mouth at bedtime.     Yes Historical Provider, MD  benzonatate (TESSALON) 100 MG capsule Take 1 capsule (100 mg total) by mouth 3 (three) times daily as needed for cough. 12/21/14  Yes Maryann Mikhail, DO  BYSTOLIC 10 MG tablet Take 10 mg by mouth daily. 12/07/14  Yes Historical Provider, MD  docusate sodium (COLACE) 100 MG capsule Take 1 capsule (100 mg total) by mouth 2 (two) times daily. Patient taking differently: Take 100 mg by mouth 2 (two) times daily as needed for moderate constipation.  12/21/14  Yes Maryann Mikhail, DO  feeding supplement, GLUCERNA SHAKE, (GLUCERNA SHAKE) LIQD Take 237 mLs by mouth 3 (three) times daily between meals. 12/21/14  Yes Maryann Mikhail, DO  fluticasone (FLONASE) 50 MCG/ACT nasal spray Place 2 sprays into the nose daily as needed for allergies.    Yes Historical Provider, MD  gabapentin (NEURONTIN) 100 MG capsule Take 100  mg by mouth 3 (three) times daily. 11/23/14  Yes Historical Provider, MD  hydrOXYzine (ATARAX/VISTARIL) 25 MG tablet Take 25 mg by mouth daily as needed. Itching, anxiety 02/15/15  Yes Historical Provider, MD  ipratropium (ATROVENT) 0.03 % nasal spray Place 1 spray into both nostrils daily as needed. allergies 02/24/15  Yes Historical Provider, MD  loratadine (CLARITIN) 10 MG tablet Take 10 mg by mouth daily.     Yes Historical Provider, MD  losartan-hydrochlorothiazide (HYZAAR) 100-12.5 MG tablet Take 1 tablet by mouth daily.  02/24/15  Yes Historical Provider, MD  magnesium oxide (MAG-OX) 400 (241.3 MG) MG tablet Take 1 tablet by mouth daily. 11/23/14  Yes Historical Provider, MD  megestrol (MEGACE) 400 MG/10ML suspension Take 5 mLs (200 mg total) by mouth daily. 12/21/14  Yes Maryann Mikhail, DO  omeprazole (PRILOSEC) 20 MG capsule Take 40 mg by mouth daily.     Yes Historical Provider, MD  oxyCODONE-acetaminophen (PERCOCET/ROXICET) 5-325 MG tablet Take 1 tablet by mouth every 4 (four) hours as needed. pain 01/23/15  Yes Historical Provider, MD  pioglitazone (ACTOS) 30 MG tablet Take 30 mg by mouth daily. 11/01/14  Yes Historical Provider, MD  potassium chloride (K-DUR) 10 MEQ tablet Take 10 mEq by mouth daily. 12/28/14  Yes Historical Provider, MD  simvastatin (ZOCOR) 20 MG tablet Take 20 mg by mouth daily. 02/21/15  Yes Historical Provider, MD  traMADol (ULTRAM) 50 MG tablet Take 1 tablet (50 mg total) by mouth every 6 (six) hours as needed. Patient taking differently: Take 50 mg by mouth every 6 (six) hours as needed for moderate pain.  09/25/13  Yes Gregor Hams, MD  traZODone (DESYREL) 50 MG tablet Take 50 mg by mouth at bedtime as needed for sleep.  11/23/14  Yes Historical Provider, MD  VENTOLIN HFA 108 (90 BASE) MCG/ACT inhaler Inhale 2 puffs into the lungs every 4 (four) hours as needed for wheezing or shortness of breath.  11/23/14  Yes Historical Provider, MD  levofloxacin (LEVAQUIN) 500 MG tablet Take 500 mg by mouth daily. Reported on 03/23/2015 03/07/15   Historical Provider, MD   Allergies  Allergen Reactions  . Eggs Or Egg-Derived Products Hives  . Morphine And Related Itching and Other (See Comments)    Crawling sensations  . Vicodin [Hydrocodone-Acetaminophen] Itching and Other (See Comments)    Crawling sensations  . Banana Other (See Comments)    Pt does not eat bananas  . Levaquin [Levofloxacin] Other (See Comments)    Hallucinations: Pt was to take 10 day course, but began to hallucinate after day  8.  . Shellfish Allergy Other (See Comments)    Pt doesn't eat SeaFood at all. No Fish, no Shrimp, etc    FAMILY HISTORY:  Family History  Problem Relation Age of Onset  . Diabetes Mellitus II Daughter   . Cancer Mother   . Dementia Mother    SOCIAL HISTORY:  reports that she has been smoking E-cigarettes.  She has quit using smokeless tobacco. She reports that she does not drink alcohol or use illicit drugs.  REVIEW OF SYSTEMS:  Could not be obtained as patient is intubated.   SUBJECTIVE:   VITAL SIGNS: Temp:  [97.8 F (36.6 C)-99 F (37.2 C)] 99 F (37.2 C) (01/16 1645) Pulse Rate:  [81-172] 86 (01/16 1620) Resp:  [10-27] 14 (01/16 1620) BP: (63-139)/(24-127) 93/30 mmHg (01/16 1620) SpO2:  [93 %-100 %] 100 % (01/16 1620) FiO2 (%):  [100 %] 100 % (01/16 1617) Weight:  [  84.4 kg (186 lb 1.1 oz)] 84.4 kg (186 lb 1.1 oz) (01/16 0410) HEMODYNAMICS:   VENTILATOR SETTINGS: Vent Mode:  [-] PRVC FiO2 (%):  [100 %] 100 % Set Rate:  [14 bmp] 14 bmp Vt Set:  [480 mL] 480 mL PEEP:  [5 cmH20] 5 cmH20 Plateau Pressure:  [19 cmH20] 19 cmH20 INTAKE / OUTPUT: Intake/Output      01/15 0701 - 01/16 0700 01/16 0701 - 01/17 0700   P.O. 240    I.V. (mL/kg) 410.3 (4.9) 300 (3.6)   IV Piggyback 282    Total Intake(mL/kg) 932.3 (11) 300 (3.6)   Urine (mL/kg/hr) 3100 (1.5) 850 (1)   Stool 0 (0) 0 (0)   Total Output 3100 850   Net -2167.7 -550        Stool Occurrence 1 x 1 x     PHYSICAL EXAMINATION: General:  Intubated Neuro: Somnolent but arousable. Following commands.  HEENT: Oral endotracheal tube. L internal jugular central line.  Cardiovascular: RRR. S1 and S2 appreciated. No m/r/g.  Lungs: Anterior lung fields clear to ausculation bilaterally. Equal chest rise.  Abdomen: Soft. +BS. NT/ND.  Musculoskeletal: L upper arm AV fistula. HD catheter (R chest).  Skin:  Warm and dry.   LABS:  CBC  Recent Labs Lab 03/18/15 0340 03/12/2015 0527 03/16/2015 1556  WBC 15.2* 17.1*  16.9*  HGB 8.9* 8.6* 7.5*  HCT 26.7* 25.7* 22.1*  PLT 200 191 166   Coag's  Recent Labs Lab 03/16/15 0255  APTT 29  INR 1.26   BMET  Recent Labs Lab 03/18/15 0340 03/18/15 1348 03/18/2015 0527  NA 144 144 143  K 4.4 4.6 4.1  CL 112* 110 109  CO2 18* 21* 19*  BUN 53* 53* 55*  CREATININE 5.17* 4.96* 5.07*  GLUCOSE 110* 125* 91   Electrolytes  Recent Labs Lab 03/18/15 0340 03/18/15 1348 03/18/15 1821 03/13/2015 0527  CALCIUM 8.2* 7.9*  --  7.7*  MG  --   --  1.1*  --   PHOS 4.9*  --   --   --    Sepsis Markers  Recent Labs Lab 03/16/15 0255  03/16/15 1850 03/16/15 2130 03/16/15 2352  LATICACIDVEN  --   < > 2.4* 2.5* 3.1*  PROCALCITON 3.69  --   --   --   --   < > = values in this interval not displayed. ABG  Recent Labs Lab 03/17/15 1020 03/17/15 1800 03/18/15 0134  PHART 7.304* 7.362 7.384  PCO2ART 27.2* 26.3* 28.6*  PO2ART 60.1* 65.2* 62.6*   Liver Enzymes  Recent Labs Lab 03/28/2015 2321 03/18/15 0340  AST 31  --   ALT 11*  --   ALKPHOS 52  --   BILITOT 0.7  --   ALBUMIN 3.6 2.4*   Cardiac Enzymes  Recent Labs Lab 03/18/15 1821 03/18/15 2302 03/20/2015 0527  TROPONINI 0.12* 0.49* 0.53*   Glucose  Recent Labs Lab 03/18/15 2009 03/18/15 2329 03/21/2015 0413 03/04/2015 0731 03/04/2015 0901 03/12/2015 1517  GLUCAP 123* 98 96 84 80 102*    Imaging Dg Chest Port 1 View  03/06/2015  CLINICAL DATA:  Intubation EXAM: PORTABLE CHEST 1 VIEW COMPARISON:  March 19, 2015 FINDINGS: The ET tube terminates 3.2 cm above the carina in good position. The right-sided dialysis catheter terminates near the cavoatrial junction or just inside the right atrium, unchanged. There is a central line entering via the left neck with the distal tip over the aortic arch. This could be arterial rather  than venous. No pneumothorax. The left lung is clear. Significant opacification of the right chest is unchanged. The cardiomediastinal silhouette is stable. IMPRESSION:  1. The left central line is in an unusual position with the distal tip over the aortic arch. The findings are concerning for arterial placement rather than venous placement. 2. The ET tube is in good position. The right dialysis catheter is stable. 3. Right-sided pneumonia, unchanged. These results will be called to the ordering clinician or representative by the Radiologist Assistant, and communication documented in the PACS or zVision Dashboard. Electronically Signed   By: Dorise Bullion III M.D   On: 03/07/2015 16:53   Dg Chest Port 1 View  03/22/2015  CLINICAL DATA:  Status post catheter placement EXAM: PORTABLE CHEST 1 VIEW COMPARISON:  03/18/2015 FINDINGS: New dialysis catheter on the right with tip at the right atrial level. New left IJ catheter which does not cross midline and terminates over the aortic knob. There is no persistent left SVC on chest CT 02/27/2007. This catheter could be arterial or in the IMV. No evidence of pneumothorax. Stable cardiomegaly and upper mediastinal contours for technique. Extensive airspace disease over the right lung. These results were called by telephone at the time of interpretation on 03/04/2015 at 3:10 pm to [place], who verbally acknowledged these results and will communicate with attending. IMPRESSION: 1. Unexpected positioning of left IJ catheter, tip overlapping the aortic knob, as described above. 2. New right-sided dialysis catheter with tip over the right atrium. 3. Unchanged right-sided pneumonia with layering fluid. 4. No pneumothorax. Electronically Signed   By: Monte Fantasia M.D.   On: 03/05/2015 15:13   Dg Chest Port 1 View  03/18/2015  CLINICAL DATA:  Pulmonary edema. Healthcare associated pneumonia. End-stage renal disease. EXAM: PORTABLE CHEST 1 VIEW COMPARISON:  03/17/2015 FINDINGS: Right lung volume loss diffuse heterogeneous airspace disease shows no significant change. Left lung remains grossly clear. Heart size is stable. Surgical clips  again seen in the axilla. IMPRESSION: No significant change in right lung volume loss and diffuse airspace disease. Electronically Signed   By: Earle Gell M.D.   On: 03/18/2015 14:12   Dg Fluoro Guide Cv Line-no Report  03/20/2015  CLINICAL DATA:  FLOURO GUIDE CV LINE Fluoroscopy was utilized by the requesting physician.  No radiographic interpretation.    ASSESSMENT / PLAN:  PULMONARY A: Acute hypoxic hypercarbic respiratory failure likely from PNA CAP on vanc/ zosyn 1/13, now on Zosyn   Hx of Asthma   P:   -Continue mechanical ventilation -Wean as tolerated -NO precedex -prn Fentanyl for sedation -Continue nebs  -Continue Zosyn   CARDIOVASCULAR A: Hypotension during AVF and HD cath placement - likely due to Precedex gtt in the setting of pulmonary HTN. Echo showing high PA pressure.   Elevated troponin - EKG normal  Sepsis from PNA P:  -NO precedex. Use Fentanyl prn for sedation  -Check CK-MB for ACS -Monitor hemodynamics   RENAL A:  AKI superimposed on chronic kidney disease - needs dialysis Metabolic acidosis P:   -Monitor renal function   GASTROINTESTINAL A:  Hx of GERD P:   -Protonix  -Colace   HEMATOLOGIC A:  Anemia of chronic disease from ESRD and possible acute blood loss from surgery P:  -Hgb 7.5, continue to monitor  -Monitor CBC -Transfuse if Hgb <7  INFECTIOUS A: Sepsis from pneumonia  Leukocytosis  P:   -Monitor CBC -Pending blood cultures  -Continue Zosyn   ENDOCRINE A:  HX of DM2  P:   -Monitor CBGs -SSI  NEUROLOGIC A:  Acute encephalopathy 2/2 pneumonia, sepsis, renal failure, respiratory failure  AMS in the setting of hypotension P:   -Continue to monitor  -Continue mechanical ventilation to protect airway. Wean as tolerated.   TODAY'S SUMMARY:   I have personally obtained a history, examined the patient, evaluated laboratory and imaging results, formulated the assessment and plan and placed orders. CRITICAL CARE: The patient  is critically ill with multiple organ systems failure and requires high complexity decision making for assessment and support, frequent evaluation and titration of therapies, application of advanced monitoring technologies and extensive interpretation of multiple databases. Critical Care Time devoted to patient care services described in this note is minutes.    Pulmonary and [place] Pager: [phone]  04/01/2015, 5:16 PM

## 2015-03-19 NOTE — Progress Notes (Signed)
Arrived to patient room 2S - 02 at 2100.  Reviewed treatment plan and this RN agrees with plan.  Report received from bedside RN, Elmyra Ricks.   Patient intubated, sedated.   Lung sounds diminished to ausculation in all fields. Generalized edema. Cardiac:  Regular R&R.  Removed caps and cleansed RIj catheter with chlorhedxidine.  Aspirated ports of heparin and flushed them with saline per protocol.  Connected and secured lines, initiated treatment at 2138.  UF Goal of 1500 and net fluid removal 1L.  Will continue to monitor.

## 2015-03-19 NOTE — Progress Notes (Signed)
Dr.Mclung notified for elevated troponin.

## 2015-03-19 NOTE — Progress Notes (Signed)
[facility] Progress Note Patient Name: Stacey Zamora DOB: 1935-08-11 MRN: [id number]   Date of Service  03/25/2015  HPI/Events of Note  hyoptensive during AVF & HD cath placement under precedex gtt & LA. Echo - high PA pressure Unresponsive - intubated  eICU Interventions  Vent orders Use fent for sedation - dc precedx (prob causing hypotension in setting of pulm htn) EKG nml - check CK-MB for ACS D/w resident     Intervention Category Evaluation Type: New Patient Evaluation  [name] V. 03/16/2015, 6:49 PM

## 2015-03-19 NOTE — Progress Notes (Signed)
Subjective: Interval History: seen in PACU post RIJ cath and LUA AVF, reintub after hypovent.  Objective: Vital signs in last 24 hours: Temp:  [97.8 F (36.6 C)-98.1 F (36.7 C)] 98.1 F (36.7 C) (01/16 1440) Pulse Rate:  [81-172] 86 (01/16 1620) Resp:  [10-27] 14 (01/16 1620) BP: (63-139)/(24-127) 93/30 mmHg (01/16 1620) SpO2:  [93 %-100 %] 100 % (01/16 1620) FiO2 (%):  [100 %] 100 % (01/16 1617) Weight:  [84.4 kg (186 lb 1.1 oz)] 84.4 kg (186 lb 1.1 oz) (01/16 0410) Weight change: -1.3 kg (-2 lb 13.9 oz)  Intake/Output from previous day: 01/15 0701 - 01/16 0700 In: 932.3 [P.O.:240; I.V.:410.3; IV Piggyback:282] Out: 3100 [Urine:3100] Intake/Output this shift: Total I/O In: 300 [I.V.:300] Out: 850 [Urine:850]  General appearance: sedated on vent, BP low 100s , had pressors  Resp: diminished breath sounds bilaterally Chest wall: RIJ cath Cardio: S1, S2 normal and systolic murmur: holosystolic 2/6, blowing at apex GI: obese, pos bs, but decreased Extremities: LUA AVF, b&t  Lab Results:  Recent Labs  03/07/2015 0527 03/27/2015 1556  WBC 17.1* 16.9*  HGB 8.6* 7.5*  HCT 25.7* 22.1*  PLT 191 166   BMET:  Recent Labs  03/18/15 1348 04/03/2015 0527  NA 144 143  K 4.6 4.1  CL 110 109  CO2 21* 19*  GLUCOSE 125* 91  BUN 53* 55*  CREATININE 4.96* 5.07*  CALCIUM 7.9* 7.7*    Recent Labs  03/18/15 0340  PTH 85*   Iron Studies: No results for input(s): IRON, TIBC, TRANSFERRIN, FERRITIN in the last 72 hours.  Studies/Results: Dg Chest Port 1 View  03/14/2015  CLINICAL DATA:  Status post catheter placement EXAM: PORTABLE CHEST 1 VIEW COMPARISON:  03/18/2015 FINDINGS: New dialysis catheter on the right with tip at the right atrial level. New left IJ catheter which does not cross midline and terminates over the aortic knob. There is no persistent left SVC on chest CT 02/27/2007. This catheter could be arterial or in the IMV. No evidence of pneumothorax. Stable cardiomegaly  and upper mediastinal contours for technique. Extensive airspace disease over the right lung. These results were called by telephone at the time of interpretation on 03/18/2015 at 3:10 pm to [place], who verbally acknowledged these results and will communicate with attending. IMPRESSION: 1. Unexpected positioning of left IJ catheter, tip overlapping the aortic knob, as described above. 2. New right-sided dialysis catheter with tip over the right atrium. 3. Unchanged right-sided pneumonia with layering fluid. 4. No pneumothorax. Electronically Signed   By: Monte Fantasia M.D.   On: 03/21/2015 15:13   Dg Chest Port 1 View  03/18/2015  CLINICAL DATA:  Pulmonary edema. Healthcare associated pneumonia. End-stage renal disease. EXAM: PORTABLE CHEST 1 VIEW COMPARISON:  03/17/2015 FINDINGS: Right lung volume loss diffuse heterogeneous airspace disease shows no significant change. Left lung remains grossly clear. Heart size is stable. Surgical clips again seen in the axilla. IMPRESSION: No significant change in right lung volume loss and diffuse airspace disease. Electronically Signed   By: Earle Gell M.D.   On: 03/18/2015 14:12   Dg Fluoro Guide Cv Line-no Report  03/14/2015  CLINICAL DATA:  FLOURO GUIDE CV LINE Fluoroscopy was utilized by the requesting physician.  No radiographic interpretation.    I have reviewed the patient's current medications.  Assessment/Plan: 1 ESRD to start HD.  ? Uremia contrib to current state . For HD.  Vol xs but bp low, lower goal 2 Anemia check see if still bleeding,  check Fe, Give esa 3 HPTH mild  4 DM controlled 5 ? pneu P HD, vent , lower goal, esa, Fe check   LOS: 3 days   Ijeoma Loor L 03/23/2015,4:37 PM

## 2015-03-19 NOTE — Anesthesia Procedure Notes (Signed)
Procedure Name: MAC Date/Time: 03/13/2015 1:17 PM Performed by: Gilford Rile S Ventilation: Oral airway inserted - appropriate to patient size Placement Confirmation: positive ETCO2

## 2015-03-19 NOTE — Anesthesia Procedure Notes (Signed)
Procedure Name: Intubation Date/Time: 03/12/2015 4:13 PM Performed by: Salli Quarry Kael Keetch Pre-anesthesia Checklist: Patient identified, Emergency Drugs available, Suction available and Patient being monitored Patient Re-evaluated:Patient Re-evaluated prior to inductionOxygen Delivery Method: Ambu bag Preoxygenation: Pre-oxygenation with 100% oxygen Intubation Type: IV induction Ventilation: Mask ventilation without difficulty Laryngoscope Size: McGraph and 3 Grade View: Grade I Tube type: Oral Tube size: 8.0 mm Number of attempts: 1 Airway Equipment and Method: Stylet and Video-laryngoscopy Placement Confirmation: ETT inserted through vocal cords under direct vision,  CO2 detector and breath sounds checked- equal and bilateral Secured at: 21 cm Tube secured with: Tape Dental Injury: Teeth and Oropharynx as per pre-operative assessment

## 2015-03-19 NOTE — Progress Notes (Signed)
Dr. Thereasa Solo notified that patient has lost both IVs and is having bright red bleeding from her rectum.  Dr. Thereasa Solo okay for patient to go to surgery if Dr. Oneida Alar is.  Dr. Oneida Alar RN notified of situation.  Pt is prepared to go to OR.

## 2015-03-20 ENCOUNTER — Inpatient Hospital Stay (HOSPITAL_COMMUNITY): Payer: Medicare Other

## 2015-03-20 ENCOUNTER — Telehealth: Payer: Self-pay | Admitting: Vascular Surgery

## 2015-03-20 ENCOUNTER — Encounter (HOSPITAL_COMMUNITY): Payer: Self-pay | Admitting: Vascular Surgery

## 2015-03-20 LAB — GLUCOSE, CAPILLARY
GLUCOSE-CAPILLARY: 110 mg/dL — AB (ref 65–99)
GLUCOSE-CAPILLARY: 88 mg/dL (ref 65–99)
Glucose-Capillary: 100 mg/dL — ABNORMAL HIGH (ref 65–99)
Glucose-Capillary: 101 mg/dL — ABNORMAL HIGH (ref 65–99)
Glucose-Capillary: 103 mg/dL — ABNORMAL HIGH (ref 65–99)
Glucose-Capillary: 96 mg/dL (ref 65–99)
Glucose-Capillary: 97 mg/dL (ref 65–99)

## 2015-03-20 LAB — POCT I-STAT 7, (LYTES, BLD GAS, ICA,H+H)
ACID-BASE DEFICIT: 4 mmol/L — AB (ref 0.0–2.0)
BICARBONATE: 23.1 meq/L (ref 20.0–24.0)
CALCIUM ION: 1.19 mmol/L (ref 1.13–1.30)
HCT: 24 % — ABNORMAL LOW (ref 36.0–46.0)
Hemoglobin: 8.2 g/dL — ABNORMAL LOW (ref 12.0–15.0)
O2 SAT: 100 %
PH ART: 7.251 — AB (ref 7.350–7.450)
Potassium: 4.2 mmol/L (ref 3.5–5.1)
SODIUM: 142 mmol/L (ref 135–145)
TCO2: 25 mmol/L (ref 0–100)
pCO2 arterial: 52.6 mmHg — ABNORMAL HIGH (ref 35.0–45.0)
pO2, Arterial: 261 mmHg — ABNORMAL HIGH (ref 80.0–100.0)

## 2015-03-20 LAB — CBC
HCT: 22.7 % — ABNORMAL LOW (ref 36.0–46.0)
HEMOGLOBIN: 7.7 g/dL — AB (ref 12.0–15.0)
MCH: 28.4 pg (ref 26.0–34.0)
MCHC: 33.9 g/dL (ref 30.0–36.0)
MCV: 83.8 fL (ref 78.0–100.0)
PLATELETS: 168 10*3/uL (ref 150–400)
RBC: 2.71 MIL/uL — AB (ref 3.87–5.11)
RDW: 14.5 % (ref 11.5–15.5)
WBC: 17.6 10*3/uL — AB (ref 4.0–10.5)

## 2015-03-20 LAB — COMPREHENSIVE METABOLIC PANEL
ALBUMIN: 2.1 g/dL — AB (ref 3.5–5.0)
ALK PHOS: 41 U/L (ref 38–126)
ALT: 11 U/L — AB (ref 14–54)
AST: 27 U/L (ref 15–41)
Anion gap: 14 (ref 5–15)
BUN: 25 mg/dL — ABNORMAL HIGH (ref 6–20)
CHLORIDE: 102 mmol/L (ref 101–111)
CO2: 25 mmol/L (ref 22–32)
CREATININE: 2.78 mg/dL — AB (ref 0.44–1.00)
Calcium: 7.8 mg/dL — ABNORMAL LOW (ref 8.9–10.3)
GFR calc non Af Amer: 15 mL/min — ABNORMAL LOW (ref >60)
GFR, EST AFRICAN AMERICAN: 18 mL/min — AB (ref >60)
GLUCOSE: 107 mg/dL — AB (ref 65–99)
Potassium: 3.3 mmol/L — ABNORMAL LOW (ref 3.5–5.1)
SODIUM: 141 mmol/L (ref 135–145)
Total Bilirubin: 0.8 mg/dL (ref 0.3–1.2)
Total Protein: 5.1 g/dL — ABNORMAL LOW (ref 6.5–8.1)

## 2015-03-20 LAB — CK TOTAL AND CKMB (NOT AT ARMC)
CK TOTAL: 52 U/L (ref 38–234)
CK, MB: 8.5 ng/mL — ABNORMAL HIGH (ref 0.5–5.0)
Relative Index: INVALID (ref 0.0–2.5)

## 2015-03-20 LAB — TROPONIN I
TROPONIN I: 0.05 ng/mL — AB (ref <0.031)
Troponin I: 0.17 ng/mL — ABNORMAL HIGH (ref <0.031)
Troponin I: 0.16 ng/mL — ABNORMAL HIGH (ref <0.031)

## 2015-03-20 LAB — PHOSPHORUS: Phosphorus: 3.1 mg/dL (ref 2.5–4.6)

## 2015-03-20 MED ORDER — POTASSIUM CHLORIDE 10 MEQ/100ML IV SOLN: 10.0000 meq | INTRAVENOUS | Status: DC

## 2015-03-20 MED ORDER — SODIUM CHLORIDE 0.9 % IV SOLN
125.0000 mg | INTRAVENOUS | Status: DC
  Administered 2015-03-23 – 2015-03-28 (×3): 125 mg via INTRAVENOUS
  Filled 2015-03-20 (×11): qty 10

## 2015-03-20 MED ORDER — SODIUM CHLORIDE 0.9 % IV SOLN
INTRAVENOUS | Status: DC
  Administered 2015-03-20 (×2): via INTRAVENOUS

## 2015-03-20 MED ORDER — POTASSIUM CHLORIDE 10 MEQ/50ML IV SOLN
10.0000 meq | INTRAVENOUS | Status: AC
  Administered 2015-03-20 (×2): 10 meq via INTRAVENOUS
  Filled 2015-03-20 (×2): qty 50

## 2015-03-20 MED ORDER — SODIUM CHLORIDE 0.9 % IV SOLN
INTRAVENOUS | Status: DC
  Administered 2015-03-23 (×2): via INTRAVENOUS
  Administered 2015-03-26: 20 mL/h via INTRAVENOUS

## 2015-03-20 NOTE — Progress Notes (Signed)
Dialysis treatment completed.  500 mL ultrafiltrated.  0 mL net fluid removal.  Patient status unchanged. Lung sounds diminished to ausculation in all fields. No edema. Cardiac: Regular R&R.  Cleansed RIj catheter with chlorhexidine.  Disconnected lines and flushed ports with saline per protocol.  Ports locked with heparin and capped per protocol.    Report given to bedside, RN Berneta Sages.  UF goal lowered due to irregular HR, accelerating into 140's at times, and hypotension.

## 2015-03-20 NOTE — Telephone Encounter (Signed)
-----   Message from Mena Goes, RN sent at 03/24/2015  2:50 PM EST ----- Regarding: schedule   ----- Message -----    From: Elam Dutch, MD    Sent: 03/09/2015   2:26 PM      To: [facility] Charge Pool  US neck palindrome  Left brachial cephalic AVF Nurse asst  Needs follow up in 1 month with duplex  Ruta Hinds

## 2015-03-20 NOTE — Progress Notes (Signed)
   Daily Progress Note  Assessment/Planning: POD #1 s/p RIJV TDC, L BC AVF   Remains intubated  No obvious signs of steal or PTX  Follow up with Dr. Oneida Alar in 6 weeks in the office  Available as needed  Subjective  - 1 Day Post-Op  Remains intubated  Objective Filed Vitals:   03/20/15 0615 03/20/15 0630 03/20/15 0645 03/20/15 0748  BP: 107/42 111/40 108/47   Pulse: 88 88 88   Temp:    100.5 F (38.1 C)  TempSrc:    Axillary  Resp: 18 18 18    Height:      Weight:      SpO2: 100% 100% 100%     Intake/Output Summary (Last 24 hours) at 03/20/15 0750 Last data filed at 03/20/15 0600  Gross per 24 hour  Intake 1742.02 ml  Output   1335 ml  Net 407.02 ml    VASC  RIJV TDC in place, no blood on bandages, L arm inc c/d/i, +thrill, warm L hand, spontaneously moving hand  Laboratory CBC    Component Value Date/Time   WBC 17.6* 03/20/2015 0330   HGB 7.7* 03/20/2015 0330   HCT 22.7* 03/20/2015 0330   PLT 168 03/20/2015 0330    BMET    Component Value Date/Time   NA 141 03/20/2015 0330   K 3.3* 03/20/2015 0330   CL 102 03/20/2015 0330   CO2 25 03/20/2015 0330   GLUCOSE 107* 03/20/2015 0330   BUN 25* 03/20/2015 0330   CREATININE 2.78* 03/20/2015 0330   CALCIUM 7.8* 03/20/2015 0330   GFRNONAA 15* 03/20/2015 0330   GFRAA 18* 03/20/2015 0330    Adele Barthel, MD [facility]: [phone] Pager: [phone]  03/20/2015, 7:50 AM

## 2015-03-20 NOTE — Progress Notes (Addendum)
PULMONARY / CRITICAL CARE MEDICINE   Name: Stacey Zamora MRN: [id number] DOB: Feb 24, 1936    ADMISSION DATE:  03/31/2015 CONSULTATION DATE:  03/21/2015  REFERRING MD :  Anesthesiology/ surgery  PRIMARY SERVICE: CCM  HISTORY OF PRESENT ILLNESS:  Patient is intubated. History obtained from anesthesiologist Dr. Linna Caprice and [facility] documentation.  Patient is a 80 yo F with a PMHx of HTN, asthma, breast ca, DM, HLD, CKD stage 3 who was found unresponsive after HD cath placement today. Patient was admitted for acute hypoxic respiratory failure from PNA. She underwent procedure for RIJ cath and LUA AVF by vascular surgery this afternoon. As per anesthesiology, patient received Versed 4 mg, Fentanyl 50 mcg, Neo-synephrine 4770 mcg, Precedex 69 mcg, and Etomidate 20 mg during the procedure. She continued to be hypotensive during the procedure. Amount of blood loss is not reported in EPIC. Patient was receiving IV Lasix 160 mg q6 prior to the procedure.   As per anesthesiologist and nursing staff, when patient initially came into the PACU after the procedure she was arousable and following commands, however, was hypotensive with systolic in the [id number]. She was given Neo-synephrine in the PACU and subsequently became more obtunded and then unresponsive. She was also given Albumin 5% 12.5 g and Calcium 1g. Patient was intubated by anesthesiology to protect her airway. She was given Etomidate 8 mg and Succinylcholine 100 mg during the process. No Precedex was administered. ABG was obtained 30 mins after intubation and showed pH 7.25, PCO2 52.6, and PO2 261. Labs were also obtained at this time and revealed Na 142, K 4.2, H/H 8.2/24. Patient was continued on Neo-synephrine 30 mcg/ min for hypotension.   PAST MEDICAL HISTORY :  Past Medical History  Diagnosis Date  . Hypertension   . Asthma   . Shortness of breath   . Cancer ([place])     right breast  . GERD (gastroesophageal reflux disease)   . Arthritis   . Pneumonia    . Diabetes mellitus   . HLD (hyperlipidemia)   . CKD (chronic kidney disease), stage III    Past Surgical History  Procedure Laterality Date  . Wrist surgery    . Breast surgery    . Abdominal hysterectomy    . Cholecystectomy    . Carpel tunnel     Prior to Admission medications   Medication Sig Start Date End Date Taking? Authorizing Provider  ADVAIR DISKUS 250-50 MCG/DOSE AEPB Inhale 1 puff into the lungs 2 (two) times daily. 11/23/14  Yes Historical Provider, MD  amitriptyline (ELAVIL) 50 MG tablet Take 50 mg by mouth at bedtime.     Yes Historical Provider, MD  benzonatate (TESSALON) 100 MG capsule Take 1 capsule (100 mg total) by mouth 3 (three) times daily as needed for cough. 12/21/14  Yes Maryann Mikhail, DO  BYSTOLIC 10 MG tablet Take 10 mg by mouth daily. 12/07/14  Yes Historical Provider, MD  docusate sodium (COLACE) 100 MG capsule Take 1 capsule (100 mg total) by mouth 2 (two) times daily. Patient taking differently: Take 100 mg by mouth 2 (two) times daily as needed for moderate constipation.  12/21/14  Yes Maryann Mikhail, DO  feeding supplement, GLUCERNA SHAKE, (GLUCERNA SHAKE) LIQD Take 237 mLs by mouth 3 (three) times daily between meals. 12/21/14  Yes Maryann Mikhail, DO  fluticasone (FLONASE) 50 MCG/ACT nasal spray Place 2 sprays into the nose daily as needed for allergies.    Yes Historical Provider, MD  gabapentin (NEURONTIN) 100 MG capsule  Take 100 mg by mouth 3 (three) times daily. 11/23/14  Yes Historical Provider, MD  hydrOXYzine (ATARAX/VISTARIL) 25 MG tablet Take 25 mg by mouth daily as needed. Itching, anxiety 02/15/15  Yes Historical Provider, MD  ipratropium (ATROVENT) 0.03 % nasal spray Place 1 spray into both nostrils daily as needed. allergies 02/24/15  Yes Historical Provider, MD  loratadine (CLARITIN) 10 MG tablet Take 10 mg by mouth daily.     Yes Historical Provider, MD  losartan-hydrochlorothiazide (HYZAAR) 100-12.5 MG tablet Take 1 tablet by mouth  daily. 02/24/15  Yes Historical Provider, MD  magnesium oxide (MAG-OX) 400 (241.3 MG) MG tablet Take 1 tablet by mouth daily. 11/23/14  Yes Historical Provider, MD  megestrol (MEGACE) 400 MG/10ML suspension Take 5 mLs (200 mg total) by mouth daily. 12/21/14  Yes Maryann Mikhail, DO  omeprazole (PRILOSEC) 20 MG capsule Take 40 mg by mouth daily.     Yes Historical Provider, MD  oxyCODONE-acetaminophen (PERCOCET/ROXICET) 5-325 MG tablet Take 1 tablet by mouth every 4 (four) hours as needed. pain 01/23/15  Yes Historical Provider, MD  pioglitazone (ACTOS) 30 MG tablet Take 30 mg by mouth daily. 11/01/14  Yes Historical Provider, MD  potassium chloride (K-DUR) 10 MEQ tablet Take 10 mEq by mouth daily. 12/28/14  Yes Historical Provider, MD  simvastatin (ZOCOR) 20 MG tablet Take 20 mg by mouth daily. 02/21/15  Yes Historical Provider, MD  traMADol (ULTRAM) 50 MG tablet Take 1 tablet (50 mg total) by mouth every 6 (six) hours as needed. Patient taking differently: Take 50 mg by mouth every 6 (six) hours as needed for moderate pain.  09/25/13  Yes Gregor Hams, MD  traZODone (DESYREL) 50 MG tablet Take 50 mg by mouth at bedtime as needed for sleep.  11/23/14  Yes Historical Provider, MD  VENTOLIN HFA 108 (90 BASE) MCG/ACT inhaler Inhale 2 puffs into the lungs every 4 (four) hours as needed for wheezing or shortness of breath.  11/23/14  Yes Historical Provider, MD  levofloxacin (LEVAQUIN) 500 MG tablet Take 500 mg by mouth daily. Reported on 03/25/2015 03/07/15   Historical Provider, MD   Allergies  Allergen Reactions  . Eggs Or Egg-Derived Products Hives  . Morphine And Related Itching and Other (See Comments)    Crawling sensations  . Vicodin [Hydrocodone-Acetaminophen] Itching and Other (See Comments)    Crawling sensations  . Banana Other (See Comments)    Pt does not eat bananas  . Levaquin [Levofloxacin] Other (See Comments)    Hallucinations: Pt was to take 10 day course, but began to hallucinate  after day 8.  . Shellfish Allergy Other (See Comments)    Pt doesn't eat SeaFood at all. No Fish, no Shrimp, etc    FAMILY HISTORY:  Family History  Problem Relation Age of Onset  . Diabetes Mellitus II Daughter   . Cancer Mother   . Dementia Mother    SOCIAL HISTORY:  reports that she has been smoking E-cigarettes.  She has quit using smokeless tobacco. She reports that she does not drink alcohol or use illicit drugs.  REVIEW OF SYSTEMS:  Could not be obtained as patient is intubated.   SUBJECTIVE:   VITAL SIGNS: Temp:  [98 F (36.7 C)-100.5 F (38.1 C)] 100.5 F (38.1 C) (01/17 0748) Pulse Rate:  [77-95] 84 (01/17 0840) Resp:  [10-28] 18 (01/17 0840) BP: (63-173)/(24-121) 124/51 mmHg (01/17 0840) SpO2:  [91 %-100 %] 100 % (01/17 0840) FiO2 (%):  [40 %-100 %] 40 % (01/17  0840) Weight:  [183 lb 10.3 oz (83.3 kg)-192 lb 7.4 oz (87.3 kg)] 183 lb 10.3 oz (83.3 kg) (01/17 0500) HEMODYNAMICS:   VENTILATOR SETTINGS: Vent Mode:  [-] PRVC FiO2 (%):  [40 %-100 %] 40 % Set Rate:  [14 bmp-18 bmp] 18 bmp Vt Set:  [480 mL-500 mL] 500 mL PEEP:  [5 cmH20] 5 cmH20 Plateau Pressure:  [18 cmH20-22 cmH20] 20 cmH20 INTAKE / OUTPUT: Intake/Output      01/16 0701 - 01/17 0700 01/17 0701 - 01/18 0700   P.O.     I.V. (mL/kg) 1497.7 (18) 96.7 (1.2)   IV Piggyback 316    Total Intake(mL/kg) 1813.7 (21.8) 96.7 (1.2)   Urine (mL/kg/hr) 1335 (0.7)    Other 0 (0)    Stool 0 (0)    Total Output 1335     Net +478.7 +96.7        Stool Occurrence 1 x      PHYSICAL EXAMINATION: General:  Sedated, Intubated Neuro: Somnolent but arousable. Opens eyes to commands HEENT: Oral ETT. Moist mucus membranes Cardiovascular: RRR. S1, S2, No MRG Lungs: Clear, No wheeze or crackles. Abdomen: Soft. +BS. Non tender, non distended Skin:  Intact.  LABS:  CBC  Recent Labs Lab 03/07/2015 1556 03/09/2015 1712 03/21/2015 2245 03/20/15 0330  WBC 16.9*  --  19.3* 17.6*  HGB 7.5* 8.2* 8.3* 7.7*  HCT 22.1*  24.0* 24.0* 22.7*  PLT 166  --  179 168   Coag's  Recent Labs Lab 03/16/15 0255  APTT 29  INR 1.26   BMET  Recent Labs Lab 03/18/15 1348 03/25/2015 0527 03/29/2015 1712 03/20/15 0330  NA 144 143 142 141  K 4.6 4.1 4.2 3.3*  CL 110 109  --  102  CO2 21* 19*  --  25  BUN 53* 55*  --  25*  CREATININE 4.96* 5.07*  --  2.78*  GLUCOSE 125* 91  --  107*   Electrolytes  Recent Labs Lab 03/18/15 0340 03/18/15 1348 03/18/15 1821 03/28/2015 0527 03/20/15 0330  CALCIUM 8.2* 7.9*  --  7.7* 7.8*  MG  --   --  1.1*  --   --   PHOS 4.9*  --   --   --  3.1   Sepsis Markers  Recent Labs Lab 03/16/15 0255  03/16/15 1850 03/16/15 2130 03/16/15 2352  LATICACIDVEN  --   < > 2.4* 2.5* 3.1*  PROCALCITON 3.69  --   --   --   --   < > = values in this interval not displayed. ABG  Recent Labs Lab 03/17/15 1800 03/18/15 0134 03/07/2015 1712  PHART 7.362 7.384 7.251*  PCO2ART 26.3* 28.6* 52.6*  PO2ART 65.2* 62.6* 261.0*   Liver Enzymes  Recent Labs Lab 04/01/2015 2321 03/18/15 0340 03/20/15 0330  AST 31  --  27  ALT 11*  --  11*  ALKPHOS 52  --  41  BILITOT 0.7  --  0.8  ALBUMIN 3.6 2.4* 2.1*   Cardiac Enzymes  Recent Labs Lab 03/18/15 1821 03/18/15 2302 03/07/2015 0527  TROPONINI 0.12* 0.49* 0.53*   Glucose  Recent Labs Lab 03/12/2015 0731 03/14/2015 0901 03/21/2015 1517 04/01/2015 1917 03/20/15 0026 03/20/15 0407  GLUCAP 84 80 102* 116* 101* 110*    Imaging Portable Chest Xray  03/20/2015  CLINICAL DATA:  Respiratory failure EXAM: PORTABLE CHEST 1 VIEW COMPARISON:  1/ 16/17 FINDINGS: Cardiomediastinal silhouette is stable. Again noted left central catheter with the tip in the region of aortic  arch. This is again concerning for arterial placement. Endotracheal tube is unchanged in position with tip 3.5 cm above the carina. Right IJ dialysis catheter tip in right atrium. Again noted opacification of the right chest with slight improvement in aeration in right upper  lobe. Persistent right pleural effusion with right lower lobe atelectasis or infiltrate. Small left basilar atelectasis or infiltrate. IMPRESSION: Stable right IJ dialysis catheter and endotracheal tube position. Again noted opacification of the right hemithorax with slight improvement in aeration right upper lobe. Persistent right pleural effusion with right lower lobe atelectasis or infiltrate. Again noted unusual position of left central catheter with the tip in the region of aortic arch suspicious for arterial placement. Mild left basilar atelectasis or infiltrate. Electronically Signed   By: Lahoma Crocker M.D.   On: 03/20/2015 08:03   Dg Chest Port 1 View  03/24/2015  CLINICAL DATA:  Intubation EXAM: PORTABLE CHEST 1 VIEW COMPARISON:  March 19, 2015 FINDINGS: The ET tube terminates 3.2 cm above the carina in good position. The right-sided dialysis catheter terminates near the cavoatrial junction or just inside the right atrium, unchanged. There is a central line entering via the left neck with the distal tip over the aortic arch. This could be arterial rather than venous. No pneumothorax. The left lung is clear. Significant opacification of the right chest is unchanged. The cardiomediastinal silhouette is stable. IMPRESSION: 1. The left central line is in an unusual position with the distal tip over the aortic arch. The findings are concerning for arterial placement rather than venous placement. 2. The ET tube is in good position. The right dialysis catheter is stable. 3. Right-sided pneumonia, unchanged. These results will be called to the ordering clinician or representative by the Radiologist Assistant, and communication documented in the PACS or zVision Dashboard. Electronically Signed   By: Dorise Bullion III M.D   On: 03/31/2015 16:53   Dg Chest Port 1 View  03/05/2015  CLINICAL DATA:  Status post catheter placement EXAM: PORTABLE CHEST 1 VIEW COMPARISON:  03/18/2015 FINDINGS: New dialysis catheter  on the right with tip at the right atrial level. New left IJ catheter which does not cross midline and terminates over the aortic knob. There is no persistent left SVC on chest CT 02/27/2007. This catheter could be arterial or in the IMV. No evidence of pneumothorax. Stable cardiomegaly and upper mediastinal contours for technique. Extensive airspace disease over the right lung. These results were called by telephone at the time of interpretation on 03/20/2015 at 3:10 pm to [place], who verbally acknowledged these results and will communicate with attending. IMPRESSION: 1. Unexpected positioning of left IJ catheter, tip overlapping the aortic knob, as described above. 2. New right-sided dialysis catheter with tip over the right atrium. 3. Unchanged right-sided pneumonia with layering fluid. 4. No pneumothorax. Electronically Signed   By: Monte Fantasia M.D.   On: 03/15/2015 15:13   Dg Chest Port 1 View  03/18/2015  CLINICAL DATA:  Pulmonary edema. Healthcare associated pneumonia. End-stage renal disease. EXAM: PORTABLE CHEST 1 VIEW COMPARISON:  03/17/2015 FINDINGS: Right lung volume loss diffuse heterogeneous airspace disease shows no significant change. Left lung remains grossly clear. Heart size is stable. Surgical clips again seen in the axilla. IMPRESSION: No significant change in right lung volume loss and diffuse airspace disease. Electronically Signed   By: Earle Gell M.D.   On: 03/18/2015 14:12   Dg Fluoro Guide Cv Line-no Report  03/18/2015  CLINICAL DATA:  FLOURO GUIDE CV  LINE Fluoroscopy was utilized by the requesting physician.  No radiographic interpretation.    ASSESSMENT / PLAN:  PULMONARY A: Acute hypoxic hypercarbic respiratory failure likely from PNA CAP on vanc/ zosyn 1/13, now on Zosyn   Hx of Asthma   P:   -Continue mechanical ventilation -Wean as tolerated. Still apneic on trials - Stop continuous sedation -PRN Fentanyl - Continue nebs  - On Zosyn   CARDIOVASCULAR A:  Hypotension during AVF and HD cath placement - likely due to Precedex gtt in the setting of pulmonary HTN. Echo showing high PA pressure.   Elevated troponin - EKG normal  Sepsis from PNA Afib P:  - Wean off neo as tolerated - On Amio for afib. Holding heparin for recent lower GIB. - Monitor hemodynamics   RENAL A:  AKI superimposed on chronic kidney disease - needs dialysis Metabolic acidosis P:   -S/p HD overnight.  GASTROINTESTINAL A:  Hx of GERD P:   -Protonix  -Colace   HEMATOLOGIC A:  Anemia of chronic disease from ESRD and possible acute blood loss from surgery. Lower GI bleed- ? hemmoroids P:  -Hgb 7.5, continue to monitor  -Monitor CBC -Transfuse if Hgb <7  INFECTIOUS A: Sepsis from pneumonia  Leukocytosis  P:   -Monitor CBC -Pending blood cultures  -Continue Zosyn   ENDOCRINE A:  HX of DM2 P:   -Monitor CBGs -SSI  NEUROLOGIC A:  Acute encephalopathy 2/2 pneumonia, sepsis, renal failure, respiratory failure  AMS in the setting of hypotension P:   -Continue to monitor  -Continue mechanical ventilation to protect airway. Wean as tolerated.   TODAY'S SUMMARY:   Critical care time- 45 mins.  Marshell Garfinkel MD [facility] Pager [phone] If no answer or after 3pm call: [phone] 03/20/2015, 9:02 AM

## 2015-03-20 NOTE — Anesthesia Postprocedure Evaluation (Signed)
Anesthesia Post Note  Patient: Stacey Zamora  Procedure(s) Performed: Procedure(s) (LRB): INSERTION OF DIALYSIS CATHETER RIGHT INTERNAL JUGULAR VEIN (N/A) CREATION OF LEFT UPPER ARM  ARTERIOVENOUS (AV) FISTULA  (Left)  Patient location during evaluation: PACU Anesthesia Type: MAC Level of consciousness: awake and alert Pain management: pain level controlled Vital Signs Assessment: post-procedure vital signs reviewed and stable Respiratory status: spontaneous breathing, nonlabored ventilation, respiratory function stable and patient connected to nasal cannula oxygen Cardiovascular status: stable and blood pressure returned to baseline Anesthetic complications: no    Last Vitals:  Filed Vitals:   03/20/15 0830 03/20/15 0840  BP: 124/51 124/51  Pulse: 83 84  Temp:    Resp: 18 18    Last Pain:  Filed Vitals:   03/20/15 0845  PainSc: 0-No pain                 Donnie Panik,JAMES TERRILL

## 2015-03-20 NOTE — Progress Notes (Signed)
Subjective: Interval History: hypotension,resp failure post op.  bp still low, on PE 60mcg, and on vent.  Objective: Vital signs in last 24 hours: Temp:  [98 F (36.7 C)-100.5 F (38.1 C)] 100.5 F (38.1 C) (01/17 0748) Pulse Rate:  [77-95] 88 (01/17 0645) Resp:  [10-28] 18 (01/17 0645) BP: (63-173)/(24-121) 108/47 mmHg (01/17 0645) SpO2:  [93 %-100 %] 100 % (01/17 0645) FiO2 (%):  [40 %-100 %] 40 % (01/17 0406) Weight:  [83.3 kg (183 lb 10.3 oz)-87.3 kg (192 lb 7.4 oz)] 83.3 kg (183 lb 10.3 oz) (01/17 0500) Weight change: 2.9 kg (6 lb 6.3 oz)  Intake/Output from previous day: 01/16 0701 - 01/17 0700 In: 1742 [I.V.:1426; IV Piggyback:316] Out: 1335 [Urine:1335] Intake/Output this shift:    General appearance: mouths words, does not open eyes, entubated Resp: diminished breath sounds bilaterally and rales bibasilar and RLL Chest wall: RIJ cath Cardio: S1, S2 normal and systolic murmur: holosystolic 2/6, blowing at apex GI: soft,pos bs Extremities: AVF LUA  Lab Results:  Recent Labs  03/18/2015 2245 03/20/15 0330  WBC 19.3* 17.6*  HGB 8.3* 7.7*  HCT 24.0* 22.7*  PLT 179 168   BMET:  Recent Labs  03/25/2015 0527 03/22/2015 1712 03/20/15 0330  NA 143 142 141  K 4.1 4.2 3.3*  CL 109  --  102  CO2 19*  --  25  GLUCOSE 91  --  107*  BUN 55*  --  25*  CREATININE 5.07*  --  2.78*  CALCIUM 7.7*  --  7.8*    Recent Labs  03/18/15 0340  PTH 85*   Iron Studies:  Recent Labs  03/04/2015 2145  IRON 30  TIBC 123*    Studies/Results: Dg Chest Port 1 View  03/29/2015  CLINICAL DATA:  Intubation EXAM: PORTABLE CHEST 1 VIEW COMPARISON:  March 19, 2015 FINDINGS: The ET tube terminates 3.2 cm above the carina in good position. The right-sided dialysis catheter terminates near the cavoatrial junction or just inside the right atrium, unchanged. There is a central line entering via the left neck with the distal tip over the aortic arch. This could be arterial rather than  venous. No pneumothorax. The left lung is clear. Significant opacification of the right chest is unchanged. The cardiomediastinal silhouette is stable. IMPRESSION: 1. The left central line is in an unusual position with the distal tip over the aortic arch. The findings are concerning for arterial placement rather than venous placement. 2. The ET tube is in good position. The right dialysis catheter is stable. 3. Right-sided pneumonia, unchanged. These results will be called to the ordering clinician or representative by the Radiologist Assistant, and communication documented in the PACS or zVision Dashboard. Electronically Signed   By: Dorise Bullion III M.D   On: 03/26/2015 16:53   Dg Chest Port 1 View  03/18/2015  CLINICAL DATA:  Status post catheter placement EXAM: PORTABLE CHEST 1 VIEW COMPARISON:  03/18/2015 FINDINGS: New dialysis catheter on the right with tip at the right atrial level. New left IJ catheter which does not cross midline and terminates over the aortic knob. There is no persistent left SVC on chest CT 02/27/2007. This catheter could be arterial or in the IMV. No evidence of pneumothorax. Stable cardiomegaly and upper mediastinal contours for technique. Extensive airspace disease over the right lung. These results were called by telephone at the time of interpretation on 03/23/2015 at 3:10 pm to [place], who verbally acknowledged these results and will communicate with attending.  IMPRESSION: 1. Unexpected positioning of left IJ catheter, tip overlapping the aortic knob, as described above. 2. New right-sided dialysis catheter with tip over the right atrium. 3. Unchanged right-sided pneumonia with layering fluid. 4. No pneumothorax. Electronically Signed   By: Monte Fantasia M.D.   On: 03/29/2015 15:13   Dg Chest Port 1 View  03/18/2015  CLINICAL DATA:  Pulmonary edema. Healthcare associated pneumonia. End-stage renal disease. EXAM: PORTABLE CHEST 1 VIEW COMPARISON:  03/17/2015 FINDINGS:  Right lung volume loss diffuse heterogeneous airspace disease shows no significant change. Left lung remains grossly clear. Heart size is stable. Surgical clips again seen in the axilla. IMPRESSION: No significant change in right lung volume loss and diffuse airspace disease. Electronically Signed   By: Earle Gell M.D.   On: 03/18/2015 14:12   Dg Fluoro Guide Cv Line-no Report  03/26/2015  CLINICAL DATA:  FLOURO GUIDE CV LINE Fluoroscopy was utilized by the requesting physician.  No radiographic interpretation.    I have reviewed the patient's current medications.  Assessment/Plan: 1 CKD 5/ESRD did ok on Hd.  bp low will give vol. Not much off at HD but on pressors.   2 Hypotension  ? Infx, vs other.  Give vol , on pressors 3 DM controlled 4 Anemia  Hb mildly lower cont Fe, esa, and follow 5 HCAP ?? Primary issue with low bp and resp insuffic 6 Afib on amio 7 GERD 8 HPTH ok.  9 Resp failure pneu, ?? Other , per CCM P bolus ivf, cont ab, give K.  See if can get bp up     LOS: 4 days   Mahlik Lenn L 03/20/2015,7:52 AM

## 2015-03-20 NOTE — Telephone Encounter (Signed)
LM for pt re appt, dpm °

## 2015-03-20 NOTE — Progress Notes (Addendum)
Initial Nutrition Assessment  DOCUMENTATION CODES:   Obesity unspecified  INTERVENTION:    If TF started, recommend Vital High Protein formula -- initiate at 20 ml/hr and increase by 10 ml every 4 hours to goal rate of 40 ml/hr    Add Prostat liquid protein 30 ml BID via tube  Total TF regimen to provide 1160 kcals, 114 gm protein, 804 ml free water daily  NUTRITION DIAGNOSIS:   Inadequate oral intake related to inability to eat as evidenced by NPO status  GOAL:   Patient will meet greater than or equal to 90% of their needs  MONITOR:   Vent status, Labs, Weight trends, I & O's  REASON FOR ASSESSMENT:   Ventilator  ASSESSMENT:   80 yo F with a PMHx of HTN, asthma, breast ca, DM, HLD, CKD stage 3 who was found unresponsive after HD cath placement. Patient was admitted for acute hypoxic respiratory failure from PNA.  Patient s/p procedure 1/16: ULTRASOUND GUIDED INSERTION OF LEFT AV FISTULA  Patient is currently intubated on ventilator support Temp (24hrs), Avg:99.4 F (37.4 C), Min:98.1 F (36.7 C), Max:100.5 F (38.1 C)   No feeding access (OGT/NGT) in place at this time.  Nutrition focused physical exam completed.  No muscle or subcutaneous fat depletion noticed.  Diet Order:  Diet NPO time specified  Skin:  Reviewed, no issues  Last BM:  1/14  Height:   Ht Readings from Last 1 Encounters:  03/18/2015 5\' 2"  (1.575 m)    Weight:   Wt Readings from Last 1 Encounters:  03/20/15 183 lb 10.3 oz (83.3 kg)    Ideal Body Weight:  50 kg  BMI:  Body mass index is 33.58 kg/(m^2).  Estimated Nutritional Needs:   Kcal:  [id number]  Protein:  110-120 gm  Fluid:  per MD  EDUCATION NEEDS:   No education needs identified at this time  Arthur Holms, RD, LDN Pager #: [phone] After-Hours Pager #: [phone]

## 2015-03-21 ENCOUNTER — Inpatient Hospital Stay (HOSPITAL_COMMUNITY): Payer: Medicare Other

## 2015-03-21 DIAGNOSIS — J9600 Acute respiratory failure, unspecified whether with hypoxia or hypercapnia: Secondary | ICD-10-CM | POA: Insufficient documentation

## 2015-03-21 LAB — HEPATITIS B SURFACE ANTIGEN: Hepatitis B Surface Ag: NEGATIVE

## 2015-03-21 LAB — CBC
HEMATOCRIT: 22.2 % — AB (ref 36.0–46.0)
HEMATOCRIT: 21.3 % — AB (ref 36.0–46.0)
HEMOGLOBIN: 7.7 g/dL — AB (ref 12.0–15.0)
HEMOGLOBIN: 7.5 g/dL — AB (ref 12.0–15.0)
MCH: 29.2 pg (ref 26.0–34.0)
MCH: 28.8 pg (ref 26.0–34.0)
MCHC: 34.7 g/dL (ref 30.0–36.0)
MCHC: 35.2 g/dL (ref 30.0–36.0)
MCV: 84.1 fL (ref 78.0–100.0)
MCV: 81.9 fL (ref 78.0–100.0)
Platelets: 147 10*3/uL — ABNORMAL LOW (ref 150–400)
Platelets: 160 10*3/uL (ref 150–400)
RBC: 2.64 MIL/uL — ABNORMAL LOW (ref 3.87–5.11)
RBC: 2.6 MIL/uL — ABNORMAL LOW (ref 3.87–5.11)
RDW: 14.7 % (ref 11.5–15.5)
RDW: 14.8 % (ref 11.5–15.5)
WBC: 19.6 10*3/uL — ABNORMAL HIGH (ref 4.0–10.5)
WBC: 17.4 10*3/uL — ABNORMAL HIGH (ref 4.0–10.5)

## 2015-03-21 LAB — POCT I-STAT 3, VENOUS BLOOD GAS (G3P V)
ACID-BASE DEFICIT: 4 mmol/L — AB (ref 0.0–2.0)
Bicarbonate: 20 mEq/L (ref 20.0–24.0)
O2 Saturation: 89 %
PCO2 VEN: 31.8 mmHg — AB (ref 45.0–50.0)
PH VEN: 7.407 — AB (ref 7.250–7.300)
PO2 VEN: 56 mmHg — AB (ref 30.0–45.0)
Patient temperature: 98.8
TCO2: 21 mmol/L (ref 0–100)

## 2015-03-21 LAB — COMPREHENSIVE METABOLIC PANEL
ALK PHOS: 38 U/L (ref 38–126)
ALT: 12 U/L — ABNORMAL LOW (ref 14–54)
ANION GAP: 16 — AB (ref 5–15)
AST: 31 U/L (ref 15–41)
Albumin: 1.9 g/dL — ABNORMAL LOW (ref 3.5–5.0)
BILIRUBIN TOTAL: 0.7 mg/dL (ref 0.3–1.2)
BUN: 35 mg/dL — ABNORMAL HIGH (ref 6–20)
CALCIUM: 7.8 mg/dL — AB (ref 8.9–10.3)
CO2: 21 mmol/L — ABNORMAL LOW (ref 22–32)
Chloride: 101 mmol/L (ref 101–111)
Creatinine, Ser: 4.21 mg/dL — ABNORMAL HIGH (ref 0.44–1.00)
GFR, EST AFRICAN AMERICAN: 11 mL/min — AB (ref >60)
GFR, EST NON AFRICAN AMERICAN: 9 mL/min — AB (ref >60)
Glucose, Bld: 95 mg/dL (ref 65–99)
POTASSIUM: 3.2 mmol/L — AB (ref 3.5–5.1)
Sodium: 138 mmol/L (ref 135–145)
TOTAL PROTEIN: 4.9 g/dL — AB (ref 6.5–8.1)

## 2015-03-21 LAB — CULTURE, BLOOD (ROUTINE X 2)
Culture: NO GROWTH
Culture: NO GROWTH

## 2015-03-21 LAB — LACTIC ACID, PLASMA
LACTIC ACID, VENOUS: 1.5 mmol/L (ref 0.5–2.0)
LACTIC ACID, VENOUS: 1.5 mmol/L (ref 0.5–2.0)

## 2015-03-21 LAB — POCT I-STAT 3, ART BLOOD GAS (G3+)
Acid-base deficit: 3 mmol/L — ABNORMAL HIGH (ref 0.0–2.0)
Bicarbonate: 21.4 mEq/L (ref 20.0–24.0)
O2 Saturation: 97 %
Patient temperature: 98.8
TCO2: 22 mmol/L (ref 0–100)
pCO2 arterial: 33.1 mmHg — ABNORMAL LOW (ref 35.0–45.0)
pH, Arterial: 7.419 (ref 7.350–7.450)
pO2, Arterial: 84 mmHg (ref 80.0–100.0)

## 2015-03-21 LAB — GLUCOSE, CAPILLARY
GLUCOSE-CAPILLARY: 77 mg/dL (ref 65–99)
Glucose-Capillary: 91 mg/dL (ref 65–99)
Glucose-Capillary: 83 mg/dL (ref 65–99)
Glucose-Capillary: 84 mg/dL (ref 65–99)
Glucose-Capillary: 86 mg/dL (ref 65–99)

## 2015-03-21 LAB — HEPATITIS B CORE ANTIBODY, TOTAL: HEP B C TOTAL AB: NEGATIVE

## 2015-03-21 LAB — PHOSPHORUS
PHOSPHORUS: 5.2 mg/dL — AB (ref 2.5–4.6)
Phosphorus: 4.5 mg/dL (ref 2.5–4.6)

## 2015-03-21 LAB — BASIC METABOLIC PANEL
Anion gap: 17 — ABNORMAL HIGH (ref 5–15)
BUN: 33 mg/dL — AB (ref 6–20)
CHLORIDE: 101 mmol/L (ref 101–111)
CO2: 22 mmol/L (ref 22–32)
CREATININE: 3.7 mg/dL — AB (ref 0.44–1.00)
Calcium: 7.8 mg/dL — ABNORMAL LOW (ref 8.9–10.3)
GFR calc Af Amer: 13 mL/min — ABNORMAL LOW (ref >60)
GFR calc non Af Amer: 11 mL/min — ABNORMAL LOW (ref >60)
GLUCOSE: 105 mg/dL — AB (ref 65–99)
Potassium: 3.5 mmol/L (ref 3.5–5.1)
Sodium: 140 mmol/L (ref 135–145)

## 2015-03-21 LAB — HEPATITIS B SURFACE ANTIBODY,QUALITATIVE: HEP B S AB: NONREACTIVE

## 2015-03-21 LAB — AMYLASE: AMYLASE: 109 U/L — AB (ref 28–100)

## 2015-03-21 LAB — TROPONIN I: TROPONIN I: 0.23 ng/mL — AB (ref <0.031)

## 2015-03-21 LAB — C DIFFICILE QUICK SCREEN W PCR REFLEX
C Diff antigen: NEGATIVE
C Diff interpretation: NEGATIVE
C Diff toxin: NEGATIVE

## 2015-03-21 LAB — LIPASE, BLOOD: Lipase: 58 U/L — ABNORMAL HIGH (ref 11–51)

## 2015-03-21 LAB — MAGNESIUM: Magnesium: 1.5 mg/dL — ABNORMAL LOW (ref 1.7–2.4)

## 2015-03-21 MED ORDER — FENTANYL CITRATE (PF) 100 MCG/2ML IJ SOLN
100.0000 ug | Freq: Once | INTRAMUSCULAR | Status: AC
  Administered 2015-03-21: 100 ug via INTRAVENOUS

## 2015-03-21 MED ORDER — FENTANYL CITRATE (PF) 100 MCG/2ML IJ SOLN
INTRAMUSCULAR | Status: AC
  Administered 2015-03-21: 100 ug via INTRAVENOUS
  Filled 2015-03-21: qty 2

## 2015-03-21 MED ORDER — MIDAZOLAM HCL 2 MG/2ML IJ SOLN
2.0000 mg | Freq: Once | INTRAMUSCULAR | Status: AC
  Administered 2015-03-21: 2 mg via INTRAVENOUS

## 2015-03-21 MED ORDER — HEPARIN SODIUM (PORCINE) 1000 UNIT/ML DIALYSIS: 1000.0000 [IU] | INTRAMUSCULAR | Status: DC | PRN

## 2015-03-21 MED ORDER — HEPARIN SODIUM (PORCINE) 1000 UNIT/ML DIALYSIS
40.0000 [IU]/kg | Freq: Once | INTRAMUSCULAR | Status: AC
  Administered 2015-03-21: 3400 [IU] via INTRAVENOUS_CENTRAL

## 2015-03-21 MED ORDER — PENTAFLUOROPROP-TETRAFLUOROETH EX AERO: 1.0000 "application " | INHALATION_SPRAY | CUTANEOUS | Status: DC | PRN

## 2015-03-21 MED ORDER — SODIUM CHLORIDE 0.9 % IV SOLN: 100.0000 mL | INTRAVENOUS | Status: DC | PRN

## 2015-03-21 MED ORDER — FENTANYL CITRATE (PF) 100 MCG/2ML IJ SOLN
25.0000 ug | INTRAMUSCULAR | Status: DC | PRN
  Administered 2015-03-21: 25 ug via INTRAVENOUS
  Administered 2015-03-21: 50 ug via INTRAVENOUS
  Administered 2015-03-22: 25 ug via INTRAVENOUS
  Administered 2015-03-22 – 2015-03-29 (×6): 50 ug via INTRAVENOUS
  Administered 2015-03-29 (×2): 25 ug via INTRAVENOUS
  Administered 2015-03-29: 50 ug via INTRAVENOUS
  Filled 2015-03-21 (×13): qty 2

## 2015-03-21 MED ORDER — ROCURONIUM BROMIDE 50 MG/5ML IV SOLN
1.0000 mg/kg | Freq: Once | INTRAVENOUS | Status: AC
  Administered 2015-03-21: 50 mg via INTRAVENOUS

## 2015-03-21 MED ORDER — LIDOCAINE HCL (PF) 1 % IJ SOLN: 5.0000 mL | INTRAMUSCULAR | Status: DC | PRN

## 2015-03-21 MED ORDER — MIDAZOLAM HCL 2 MG/2ML IJ SOLN
INTRAMUSCULAR | Status: AC
  Administered 2015-03-21: 2 mg via INTRAVENOUS
  Filled 2015-03-21: qty 4

## 2015-03-21 MED ORDER — LIDOCAINE-PRILOCAINE 2.5-2.5 % EX CREA: 1.0000 "application " | TOPICAL_CREAM | CUTANEOUS | Status: DC | PRN

## 2015-03-21 MED ORDER — ETOMIDATE 2 MG/ML IV SOLN
10.0000 mg | Freq: Once | INTRAVENOUS | Status: AC
  Administered 2015-03-21: 10 mg via INTRAVENOUS

## 2015-03-21 MED ORDER — ALTEPLASE 2 MG IJ SOLR: 2.0000 mg | Freq: Once | INTRAMUSCULAR | Status: DC | PRN

## 2015-03-21 NOTE — Progress Notes (Signed)
Dr. Nelda Marseille in unit and ready to intubate patient.  Family spoke with Dr. Nelda Marseille and aware of patient's status.  Fentanyl 17mcg, Versed 2mg , Etomidate 10mg  or 5cc given and Rocuronium 50mg  given for intubation.  Pt. Intubated without difficulty per Dr. Nelda Marseille.

## 2015-03-21 NOTE — Progress Notes (Signed)
eLink Physician-Brief Progress Note Patient Name: KYARA BARTMAN DOB: [birth year] MRN: [id number]   Date of Service  03/21/2015  HPI/Events of Note  Renal failure, cuff on legs, accurate BP remains a conernn  eICU Interventions  Consider lower MAp goal given above Map 55 with current reasonable mental status     Intervention Category Major Interventions: Hypotension - evaluation and management  FEINSTEIN,DANIEL J. 03/21/2015, 12:09 AM

## 2015-03-21 NOTE — Procedures (Signed)
Intubation Procedure Note Stacey Zamora [id number] 13-Apr-1935  Procedure: Intubation Indications: Respiratory insufficiency  Procedure Details Consent: Risks of procedure as well as the alternatives and risks of each were explained to the (patient/caregiver).  Consent for procedure obtained. Time Out: Verified patient identification, verified procedure, site/side was marked, verified correct patient position, special equipment/implants available, medications/allergies/relevent history reviewed, required imaging and test results available.  Performed  Maximum sterile technique was used including gloves, hand hygiene and mask.  MAC    Evaluation Hemodynamic Status: BP stable throughout; O2 sats: stable throughout Patient's Current Condition: stable Complications: No apparent complications Patient did tolerate procedure well. Chest X-ray ordered to verify placement.  CXR: pending.   Stacey Zamora 03/21/2015

## 2015-03-21 NOTE — Progress Notes (Signed)
[facility] PCCM PROGRESS NOTE  S: Asked to evaluate patient by [facility] MD for respiratory distress. She was extubated earlier today and is now receiving dialysis. Per staff RN was doing OK post extubation for the first few hours, however, she became progressively more dyspneic and tachypnea since then.  O: BP 143/50 mmHg  Pulse 95  Temp(Src) 97.9 F (36.6 C) (Oral)  Resp 31  Ht 5\' 2"  (1.575 m)  Wt 90.7 kg (199 lb 15.3 oz)  BMI 36.56 kg/m2  SpO2 99%  General:  uncomfortable appearing in mild acute respiratory distress Neuro:  Awake alert, answers appropriately and following commands. HEENT:  [place]/AT, PERRL, no JVD Cardiovascular:  RRR, no MRG. No edema Lungs:  Diminished R >L, tachypneic, increased WOB,  Abdomen:  Soft, tender L>R dullness to percussion L Musculoskeletal:  No acute deformity Skin:  Intact, MMM. HD cath R chest   A/P: Hypoxemic respiratory failure ddx includes pulmonary edema secondary to volume shifts on dialysis and acute ischemic event. -ABG -CXR -EKG -Cycle CEs -Hope to avoid re-intubation  Abd Pain -KUB   Georgann Housekeeper, ACNP [place] Pulmonology/Critical Care Pager [phone] or [phone]   Attending Note:  80 year old female, undergoing second dialysis, extubated and now with abdominal pain and SOB. On exam, minimal BS on the right and decreased on the left. DDx of acute myocardial ischemia with dialysis associated with gut ischemia that resulted in abdominal pain vs primary myocardial ischemia. Patient is having quite a difficult time breathing at this point. She is tachypneic with RR 40's to 50's and mental status is diminishing. Increasing O2 demands. Will proceed with intubation and mechanical ventilation.  Acute hypoxemic respiratory failure Pulmonary Edema - STAT intubation - CXR  R/o ACS - EKG -Troponins  Abdominal Pain - KUB  The patient is critically ill with multiple organ systems failure and requires high complexity decision  making for assessment and support, frequent evaluation and titration of therapies, application of advanced monitoring technologies and extensive interpretation of multiple databases.   Critical Care Time devoted to patient care services described in this note is  45  Minutes. This time reflects time of care of this signee Dr [name]. This critical care time does not reflect procedure time, or teaching time or supervisory time of PA/NP/Med student/Med Resident etc but could involve care discussion time.  Rush Farmer, M.D. [facility] Pulmonary/Critical Care Medicine. Pager: [phone]. After hours pager: [phone].

## 2015-03-21 NOTE — Progress Notes (Signed)
Anesthesiology Follow-up:  Patient remains on ventilator, sleepy but easily arousable,  following commands and moving all extremities. Still on low dose neosynephrine for BP support. Had dialysis on 1/16, plan dislysis today  VS: T- 36.8 BP- 109/53 HR- 84 O2 Sat 100%  FiO2 0.40  PRVC 500 RR 18 PEEP-5  CXR; ETT OK persistent R. Lung infiltrate with L. Sided atelectasis  K-3.5 BUN/Cr 33/3.7 glucose 105 WBC 19,600 H/H 22.2/7.7  Persistent sleepiness but neuro intact, no sedation since yesterday, hopefully she can be successfully weaned and extubated today.  Roberts Gaudy

## 2015-03-21 NOTE — Progress Notes (Signed)
Patient extremely anxious.  RR elevated.  Patient c/o anxiety and not being able to breathe.  Patient clearly having problems and in distress.  [facility] called and Dr. Elsworth Soho notified and aware of above.  Will have MD to come and see patient.  Family notified of patient have difficulty breathing.  To come back to hospital.

## 2015-03-21 NOTE — Procedures (Signed)
I was present at this session.  I have reviewed the session itself and made appropriate changes.  Cath flow ok at 150.  Settling down , will f/u labs  Stacey Zamora L 1/18/20174:43 PM

## 2015-03-21 NOTE — Progress Notes (Signed)
Both arms are restricted access, AM ABG not obtained. CCM notified.

## 2015-03-21 NOTE — Progress Notes (Signed)
Dialysis treatment completed.  500 mL ultrafiltrated.  0 mL net fluid removal.  Patient status unchanged. Lung sounds diminished to ausculation in all fields. No edema. Cardiac: Regular rate, intermittent arrythmias.  Cleansed RIJ catheter with chlorhexidine.  Disconnected lines and flushed ports with saline per protocol.  Ports locked with heparin and capped per protocol.    Report given to bedside, RN Katharine Look.  Pt intubated during treatment.  Nephrology physician notified.

## 2015-03-21 NOTE — Progress Notes (Signed)
Dr. Elsworth Soho called and made aware of all lab results.  No orders received.  Also made aware of ventilator settings and inability to get ABG.  Order to wean vent based on O2 Sats.

## 2015-03-21 NOTE — Progress Notes (Signed)
Arrived to patient room 2S-02 at 1534.  Reviewed treatment plan and this RN agrees with plan.  Report received from bedside RN, Katharine Look.  Consent verified.  Patient sleeping, easily aroused.  A & O X 4.   Lung sounds diminished with scattered ronchi to ausculation in all fields. No edema. Cardiac:  Tachycardic at times.  Removed caps and cleansed RIJ catheter with chlorhedxidine.  Aspirated ports of heparin and flushed them with saline per protocol.  Connected and secured lines, initiated treatment at 1610.  UF Goal of 536mL and net fluid removal 0 L.  Will continue to monitor.

## 2015-03-21 NOTE — Procedures (Signed)
Extubation Procedure Note  Patient Details:   Name: Stacey Zamora DOB: Nov 06, 1935 MRN: [id number]   Airway Documentation:   placed to 3/min [place] Incentive spirometer instructed  Evaluation  O2 sats: stable throughout Complications: No apparent complications Patient did tolerate procedure well. Bilateral Breath Sounds: Rhonchi Suctioning: Airway Yes  Revonda Standard 03/21/2015, 10:02 AM

## 2015-03-21 NOTE — Progress Notes (Signed)
PULMONARY / CRITICAL CARE MEDICINE   Name: Stacey Zamora MRN: [id number] DOB: [birth year]    ADMISSION DATE:  03/29/2015 CONSULTATION DATE:  04/01/2015  REFERRING MD :  Anesthesiology/ surgery  PRIMARY SERVICE: CCM  HISTORY OF PRESENT ILLNESS:  Patient is intubated. History obtained from anesthesiologist Dr. Linna Caprice and [facility] documentation.  Patient is a 79 yo F with a PMHx of HTN, asthma, breast ca, DM, HLD, CKD stage 3 who was found unresponsive after HD cath placement today. Patient was admitted for acute hypoxic respiratory failure from PNA. She underwent procedure for RIJ cath and LUA AVF by vascular surgery this afternoon. As per anesthesiology, patient received Versed 4 mg, Fentanyl 50 mcg, Neo-synephrine 4770 mcg, Precedex 69 mcg, and Etomidate 20 mg during the procedure. She continued to be hypotensive during the procedure. Amount of blood loss is not reported in EPIC. Patient was receiving IV Lasix 160 mg q6 prior to the procedure.   As per anesthesiologist and nursing staff, when patient initially came into the PACU after the procedure she was arousable and following commands, however, was hypotensive with systolic in the [id number]. She was given Neo-synephrine in the PACU and subsequently became more obtunded and then unresponsive. She was also given Albumin 5% 12.5 g and Calcium 1g. Patient was intubated by anesthesiology to protect her airway. She was given Etomidate 8 mg and Succinylcholine 100 mg during the process. No Precedex was administered. ABG was obtained 30 mins after intubation and showed pH 7.25, PCO2 52.6, and PO2 261. Labs were also obtained at this time and revealed Na 142, K 4.2, H/H 8.2/24. Patient was continued on Neo-synephrine 30 mcg/ min for hypotension.   PAST MEDICAL HISTORY :  Past Medical History  Diagnosis Date  . Hypertension   . Asthma   . Shortness of breath   . Cancer ([place])     right breast  . GERD (gastroesophageal reflux disease)   . Arthritis   . Pneumonia    . Diabetes mellitus   . HLD (hyperlipidemia)   . CKD (chronic kidney disease), stage III    Past Surgical History  Procedure Laterality Date  . Wrist surgery    . Breast surgery    . Abdominal hysterectomy    . Cholecystectomy    . Carpel tunnel    . Insertion of dialysis catheter N/A 03/10/2015    Procedure: INSERTION OF DIALYSIS CATHETER RIGHT INTERNAL JUGULAR VEIN;  Surgeon: Elam Dutch, MD;  Location: [place];  Service: Vascular;  Laterality: N/A;  . Av fistula placement Left 03/20/2015    Procedure: CREATION OF LEFT UPPER ARM  ARTERIOVENOUS (AV) FISTULA ;  Surgeon: Elam Dutch, MD;  Location: [place];  Service: Vascular;  Laterality: Left;   Prior to Admission medications   Medication Sig Start Date End Date Taking? Authorizing Provider  ADVAIR DISKUS 250-50 MCG/DOSE AEPB Inhale 1 puff into the lungs 2 (two) times daily. 11/23/14  Yes Historical Provider, MD  amitriptyline (ELAVIL) 50 MG tablet Take 50 mg by mouth at bedtime.     Yes Historical Provider, MD  benzonatate (TESSALON) 100 MG capsule Take 1 capsule (100 mg total) by mouth 3 (three) times daily as needed for cough. 12/21/14  Yes Maryann Mikhail, DO  BYSTOLIC 10 MG tablet Take 10 mg by mouth daily. 12/07/14  Yes Historical Provider, MD  docusate sodium (COLACE) 100 MG capsule Take 1 capsule (100 mg total) by mouth 2 (two) times daily. Patient taking differently: Take 100 mg by  mouth 2 (two) times daily as needed for moderate constipation.  12/21/14  Yes Maryann Mikhail, DO  feeding supplement, GLUCERNA SHAKE, (GLUCERNA SHAKE) LIQD Take 237 mLs by mouth 3 (three) times daily between meals. 12/21/14  Yes Maryann Mikhail, DO  fluticasone (FLONASE) 50 MCG/ACT nasal spray Place 2 sprays into the nose daily as needed for allergies.    Yes Historical Provider, MD  gabapentin (NEURONTIN) 100 MG capsule Take 100 mg by mouth 3 (three) times daily. 11/23/14  Yes Historical Provider, MD  hydrOXYzine (ATARAX/VISTARIL) 25 MG tablet  Take 25 mg by mouth daily as needed. Itching, anxiety 02/15/15  Yes Historical Provider, MD  ipratropium (ATROVENT) 0.03 % nasal spray Place 1 spray into both nostrils daily as needed. allergies 02/24/15  Yes Historical Provider, MD  loratadine (CLARITIN) 10 MG tablet Take 10 mg by mouth daily.     Yes Historical Provider, MD  losartan-hydrochlorothiazide (HYZAAR) 100-12.5 MG tablet Take 1 tablet by mouth daily. 02/24/15  Yes Historical Provider, MD  magnesium oxide (MAG-OX) 400 (241.3 MG) MG tablet Take 1 tablet by mouth daily. 11/23/14  Yes Historical Provider, MD  megestrol (MEGACE) 400 MG/10ML suspension Take 5 mLs (200 mg total) by mouth daily. 12/21/14  Yes Maryann Mikhail, DO  omeprazole (PRILOSEC) 20 MG capsule Take 40 mg by mouth daily.     Yes Historical Provider, MD  oxyCODONE-acetaminophen (PERCOCET/ROXICET) 5-325 MG tablet Take 1 tablet by mouth every 4 (four) hours as needed. pain 01/23/15  Yes Historical Provider, MD  pioglitazone (ACTOS) 30 MG tablet Take 30 mg by mouth daily. 11/01/14  Yes Historical Provider, MD  potassium chloride (K-DUR) 10 MEQ tablet Take 10 mEq by mouth daily. 12/28/14  Yes Historical Provider, MD  simvastatin (ZOCOR) 20 MG tablet Take 20 mg by mouth daily. 02/21/15  Yes Historical Provider, MD  traMADol (ULTRAM) 50 MG tablet Take 1 tablet (50 mg total) by mouth every 6 (six) hours as needed. Patient taking differently: Take 50 mg by mouth every 6 (six) hours as needed for moderate pain.  09/25/13  Yes Gregor Hams, MD  traZODone (DESYREL) 50 MG tablet Take 50 mg by mouth at bedtime as needed for sleep.  11/23/14  Yes Historical Provider, MD  VENTOLIN HFA 108 (90 BASE) MCG/ACT inhaler Inhale 2 puffs into the lungs every 4 (four) hours as needed for wheezing or shortness of breath.  11/23/14  Yes Historical Provider, MD  levofloxacin (LEVAQUIN) 500 MG tablet Take 500 mg by mouth daily. Reported on 03/27/2015 03/07/15   Historical Provider, MD   Allergies  Allergen  Reactions  . Eggs Or Egg-Derived Products Hives  . Morphine And Related Itching and Other (See Comments)    Crawling sensations  . Vicodin [Hydrocodone-Acetaminophen] Itching and Other (See Comments)    Crawling sensations  . Banana Other (See Comments)    Pt does not eat bananas  . Levaquin [Levofloxacin] Other (See Comments)    Hallucinations: Pt was to take 10 day course, but began to hallucinate after day 8.  . Shellfish Allergy Other (See Comments)    Pt doesn't eat SeaFood at all. No Fish, no Shrimp, etc    FAMILY HISTORY:  Family History  Problem Relation Age of Onset  . Diabetes Mellitus II Daughter   . Cancer Mother   . Dementia Mother    SOCIAL HISTORY:  reports that she has been smoking E-cigarettes.  She has quit using smokeless tobacco. She reports that she does not drink alcohol or use illicit  drugs.  REVIEW OF SYSTEMS:   Denies any abdominal pain, dyspnea,. Chest pain, palpitations.   SUBJECTIVE:   VITAL SIGNS: Temp:  [98.3 F (36.8 C)-99.9 F (37.7 C)] 98.8 F (37.1 C) (01/18 0844) Pulse Rate:  [79-95] 84 (01/18 0700) Resp:  [18-31] 18 (01/18 0700) BP: (73-131)/(34-101) 111/53 mmHg (01/18 0700) SpO2:  [96 %-100 %] 100 % (01/18 0734) FiO2 (%):  [40 %] 40 % (01/18 0734) Weight:  [189 lb 2.5 oz (85.8 kg)] 189 lb 2.5 oz (85.8 kg) (01/18 0446) HEMODYNAMICS: CVP:  [5 mmHg-6 mmHg] 6 mmHg VENTILATOR SETTINGS: Vent Mode:  [-] PRVC FiO2 (%):  [40 %] 40 % Set Rate:  [18 bmp] 18 bmp Vt Set:  [500 mL] 500 mL PEEP:  [5 cmH20] 5 cmH20 Plateau Pressure:  [14 cmH20-23 cmH20] 14 cmH20 INTAKE / OUTPUT: Intake/Output      01/17 0701 - 01/18 0700 01/18 0701 - 01/19 0700   I.V. (mL/kg) 2219.8 (25.9)    IV Piggyback 300    Total Intake(mL/kg) 2519.8 (29.4)    Urine (mL/kg/hr) 233 (0.1)    Other     Stool     Total Output 233     Net +2286.8            PHYSICAL EXAMINATION: General:  Awake, no distress. Neuro: Opens eyes to commands, Moves all 4  extremities HEENT: Oral ETT. Moist mucus membranes Cardiovascular: RRR. S1, S2, No MRG Lungs: Clear, No wheeze or crackles. Abdomen: Soft. +BS. Non tender, non distended Skin:  Intact.  LABS:  CBC  Recent Labs Lab 03/25/2015 2245 03/20/15 0330 03/21/15 0446  WBC 19.3* 17.6* 19.6*  HGB 8.3* 7.7* 7.7*  HCT 24.0* 22.7* 22.2*  PLT 179 168 147*   Coag's  Recent Labs Lab 03/16/15 0255  APTT 29  INR 1.26   BMET  Recent Labs Lab 03/14/2015 0527 03/24/2015 1712 03/20/15 0330 03/21/15 0446  NA 143 142 141 140  K 4.1 4.2 3.3* 3.5  CL 109  --  102 101  CO2 19*  --  25 22  BUN 55*  --  25* 33*  CREATININE 5.07*  --  2.78* 3.70*  GLUCOSE 91  --  107* 105*   Electrolytes  Recent Labs Lab 03/18/15 0340  03/18/15 1821 03/27/2015 0527 03/20/15 0330 03/21/15 0446  CALCIUM 8.2*  < >  --  7.7* 7.8* 7.8*  MG  --   --  1.1*  --   --  1.5*  PHOS 4.9*  --   --   --  3.1 4.5  < > = values in this interval not displayed. Sepsis Markers  Recent Labs Lab 03/16/15 0255  03/16/15 1850 03/16/15 2130 03/16/15 2352  LATICACIDVEN  --   < > 2.4* 2.5* 3.1*  PROCALCITON 3.69  --   --   --   --   < > = values in this interval not displayed. ABG  Recent Labs Lab 03/17/15 1800 03/18/15 0134 03/17/2015 1712  PHART 7.362 7.384 7.251*  PCO2ART 26.3* 28.6* 52.6*  PO2ART 65.2* 62.6* 261.0*   Liver Enzymes  Recent Labs Lab 03/29/2015 2321 03/18/15 0340 03/20/15 0330  AST 31  --  27  ALT 11*  --  11*  ALKPHOS 52  --  41  BILITOT 0.7  --  0.8  ALBUMIN 3.6 2.4* 2.1*   Cardiac Enzymes  Recent Labs Lab 03/20/15 0915 03/20/15 1528 03/20/15 2120  TROPONINI 0.17* 0.16* 0.05*   Glucose  Recent Labs Lab 03/20/15  1140 03/20/15 1535 03/20/15 1942 03/20/15 2351 03/21/15 0407 03/21/15 0841  GLUCAP 97 100* 88 96 91 83    Imaging Dg Chest Port 1 View  03/21/2015  CLINICAL DATA:  Dialysis catheter insertion, acute respiratory failure, hypertension, diabetes mellitus, end-  stage renal disease EXAM: PORTABLE CHEST 1 VIEW COMPARISON:  Portable exam 0602 hours compared to 03/20/2015 FINDINGS: Tip of endotracheal tube projects 3.8 cm above carina. LEFT jugular central venous catheter tip projects over aortic arch. RIGHT jugular dialysis catheter tip projects over inferior RIGHT atrium. Enlargement of cardiac silhouette with pulmonary vascular congestion. Asymmetric pulmonary infiltrates likely representing asymmetric edema increased on LEFT since previous exam. RIGHT basilar pleural effusion identified. No pneumothorax. IMPRESSION: Increased asymmetric pulmonary infiltrates RIGHT greater than LEFT question asymmetric edema. Persistent RIGHT pleural effusion. LEFT jugular line tip remains projecting over the aortic arch, cannot exclude intra-arterial placement ; catheter has been present at this position since 03/10/2015. Recommend confirmation of venous rather than arterial position of catheter. Findings called to [place] on [facility] on 03/21/2015 at 0806 hours. Electronically Signed   By: Lavonia Dana M.D.   On: 03/21/2015 08:07   Portable Chest Xray  03/20/2015  CLINICAL DATA:  Respiratory failure EXAM: PORTABLE CHEST 1 VIEW COMPARISON:  1/ 16/17 FINDINGS: Cardiomediastinal silhouette is stable. Again noted left central catheter with the tip in the region of aortic arch. This is again concerning for arterial placement. Endotracheal tube is unchanged in position with tip 3.5 cm above the carina. Right IJ dialysis catheter tip in right atrium. Again noted opacification of the right chest with slight improvement in aeration in right upper lobe. Persistent right pleural effusion with right lower lobe atelectasis or infiltrate. Small left basilar atelectasis or infiltrate. IMPRESSION: Stable right IJ dialysis catheter and endotracheal tube position. Again noted opacification of the right hemithorax with slight improvement in aeration right upper lobe. Persistent right pleural effusion  with right lower lobe atelectasis or infiltrate. Again noted unusual position of left central catheter with the tip in the region of aortic arch suspicious for arterial placement. Mild left basilar atelectasis or infiltrate. Electronically Signed   By: Lahoma Crocker M.D.   On: 03/20/2015 08:03   Dg Chest Port 1 View  03/05/2015  CLINICAL DATA:  Intubation EXAM: PORTABLE CHEST 1 VIEW COMPARISON:  March 19, 2015 FINDINGS: The ET tube terminates 3.2 cm above the carina in good position. The right-sided dialysis catheter terminates near the cavoatrial junction or just inside the right atrium, unchanged. There is a central line entering via the left neck with the distal tip over the aortic arch. This could be arterial rather than venous. No pneumothorax. The left lung is clear. Significant opacification of the right chest is unchanged. The cardiomediastinal silhouette is stable. IMPRESSION: 1. The left central line is in an unusual position with the distal tip over the aortic arch. The findings are concerning for arterial placement rather than venous placement. 2. The ET tube is in good position. The right dialysis catheter is stable. 3. Right-sided pneumonia, unchanged. These results will be called to the ordering clinician or representative by the Radiologist Assistant, and communication documented in the PACS or zVision Dashboard. Electronically Signed   By: Dorise Bullion III M.D   On: 03/29/2015 16:53   Dg Chest Port 1 View  03/20/2015  CLINICAL DATA:  Status post catheter placement EXAM: PORTABLE CHEST 1 VIEW COMPARISON:  03/18/2015 FINDINGS: New dialysis catheter on the right with tip at the right atrial level.  New left IJ catheter which does not cross midline and terminates over the aortic knob. There is no persistent left SVC on chest CT 02/27/2007. This catheter could be arterial or in the IMV. No evidence of pneumothorax. Stable cardiomegaly and upper mediastinal contours for technique. Extensive  airspace disease over the right lung. These results were called by telephone at the time of interpretation on 04/03/2015 at 3:10 pm to [place], who verbally acknowledged these results and will communicate with attending. IMPRESSION: 1. Unexpected positioning of left IJ catheter, tip overlapping the aortic knob, as described above. 2. New right-sided dialysis catheter with tip over the right atrium. 3. Unchanged right-sided pneumonia with layering fluid. 4. No pneumothorax. Electronically Signed   By: Monte Fantasia M.D.   On: 03/17/2015 15:13   Dg Fluoro Guide Cv Line-no Report  03/12/2015  CLINICAL DATA:  FLOURO GUIDE CV LINE Fluoroscopy was utilized by the requesting physician.  No radiographic interpretation.    ASSESSMENT / PLAN:  PULMONARY A: Acute hypoxic hypercarbic respiratory failure likely from PNA CAP on vanc/ zosyn 1/13, now on Zosyn   Hx of Asthma   P:   -Continue mechanical ventilation -Wean as tolerated. Will likely be extubated today.  -PRN Fentanyl - Continue nebs  - On Zosyn   CARDIOVASCULAR A: Hypotension during AVF and HD cath placement - likely due to Precedex gtt in the setting of pulmonary HTN. Echo showing high PA pressure.   Elevated troponin - EKG normal  Sepsis from PNA Afib P:  - Off neo since AM. - On Amio for afib. Holding heparin for recent lower GIB. - Monitor hemodynamics  - Lt IJ CVL placement uncertain since it does not cross the midline. However measured CVP is 5 and venous sat 89% while pleth with good wave form is 99%. I believe the line is venous. Expect higher sats on venous side given the AV fistula.  RENAL A:  AKI superimposed on chronic kidney disease - needs dialysis Metabolic acidosis P:   -S/p HD overnight.  GASTROINTESTINAL A:  Hx of GERD P:   -Protonix  -Colace   HEMATOLOGIC A:  Anemia of chronic disease from ESRD and possible acute blood loss from surgery. Lower GI bleed- ? hemmoroids P:  -Hgb 7.5, continue to monitor   -Monitor CBC -Transfuse if Hgb <7  INFECTIOUS A: Sepsis from pneumonia  Leukocytosis  P:   -Monitor CBC -Pending blood cultures  -Continue Zosyn   ENDOCRINE A:  HX of DM2 P:   -Monitor CBGs -SSI  NEUROLOGIC A:  Acute encephalopathy 2/2 pneumonia, sepsis, renal failure, respiratory failure  AMS in the setting of hypotension P:   -Continue to monitor  -Continue mechanical ventilation to protect airway. Wean as tolerated.   TODAY'S SUMMARY:   Critical care time- 45 mins.  Marshell Garfinkel MD [facility] Pager [phone] If no answer or after 3pm call: [phone] 03/21/2015, 9:28 AM

## 2015-03-21 NOTE — Progress Notes (Signed)
Dr. Jimmy Footman in unit.  Made aware of patient's complaint of abdominal pain and 2 loose, watery stools.  C-diff sent and came back as negative.  [facility]Link MD also made aware of patient's complaint.  No current orders at this time.

## 2015-03-21 NOTE — Progress Notes (Signed)
Subjective: Interval History: has no complaint, still entub, but coop, lower dose Neo.  Objective: Vital signs in last 24 hours: Temp:  [98.3 F (36.8 C)-100.5 F (38.1 C)] 98.3 F (36.8 C) (01/18 0411) Pulse Rate:  [79-95] 84 (01/18 0700) Resp:  [18-31] 18 (01/18 0700) BP: (73-131)/(34-101) 111/53 mmHg (01/18 0700) SpO2:  [91 %-100 %] 100 % (01/18 0734) FiO2 (%):  [40 %] 40 % (01/18 0734) Weight:  [85.8 kg (189 lb 2.5 oz)] 85.8 kg (189 lb 2.5 oz) (01/18 0446) Weight change: -1.5 kg (-3 lb 4.9 oz)  Intake/Output from previous day: 01/17 0701 - 01/18 0700 In: 2519.8 [I.V.:2219.8; IV Piggyback:300] Out: 233 [Urine:233] Intake/Output this shift:    General appearance: cooperative, moderately obese and slowed mentation Resp: diminished breath sounds bilaterally and rales RLL Chest wall: RIJ PC Cardio: regular rate and rhythm and systolic murmur: holosystolic 2/6, blowing at apex GI: soft, pos bs,liver down 5 cm Extremities: AVF LUA  Lab Results:  Recent Labs  03/20/15 0330 03/21/15 0446  WBC 17.6* 19.6*  HGB 7.7* 7.7*  HCT 22.7* 22.2*  PLT 168 147*   BMET:  Recent Labs  03/20/15 0330 03/21/15 0446  NA 141 140  K 3.3* 3.5  CL 102 101  CO2 25 22  GLUCOSE 107* 105*  BUN 25* 33*  CREATININE 2.78* 3.70*  CALCIUM 7.8* 7.8*   No results for input(s): PTH in the last 72 hours. Iron Studies:  Recent Labs  03/18/2015 2145  IRON 30  TIBC 123*    Studies/Results: Portable Chest Xray  03/20/2015  CLINICAL DATA:  Respiratory failure EXAM: PORTABLE CHEST 1 VIEW COMPARISON:  1/ 16/17 FINDINGS: Cardiomediastinal silhouette is stable. Again noted left central catheter with the tip in the region of aortic arch. This is again concerning for arterial placement. Endotracheal tube is unchanged in position with tip 3.5 cm above the carina. Right IJ dialysis catheter tip in right atrium. Again noted opacification of the right chest with slight improvement in aeration in right  upper lobe. Persistent right pleural effusion with right lower lobe atelectasis or infiltrate. Small left basilar atelectasis or infiltrate. IMPRESSION: Stable right IJ dialysis catheter and endotracheal tube position. Again noted opacification of the right hemithorax with slight improvement in aeration right upper lobe. Persistent right pleural effusion with right lower lobe atelectasis or infiltrate. Again noted unusual position of left central catheter with the tip in the region of aortic arch suspicious for arterial placement. Mild left basilar atelectasis or infiltrate. Electronically Signed   By: Lahoma Crocker M.D.   On: 03/20/2015 08:03   Dg Chest Port 1 View  03/17/2015  CLINICAL DATA:  Intubation EXAM: PORTABLE CHEST 1 VIEW COMPARISON:  March 19, 2015 FINDINGS: The ET tube terminates 3.2 cm above the carina in good position. The right-sided dialysis catheter terminates near the cavoatrial junction or just inside the right atrium, unchanged. There is a central line entering via the left neck with the distal tip over the aortic arch. This could be arterial rather than venous. No pneumothorax. The left lung is clear. Significant opacification of the right chest is unchanged. The cardiomediastinal silhouette is stable. IMPRESSION: 1. The left central line is in an unusual position with the distal tip over the aortic arch. The findings are concerning for arterial placement rather than venous placement. 2. The ET tube is in good position. The right dialysis catheter is stable. 3. Right-sided pneumonia, unchanged. These results will be called to the ordering clinician or representative by  the Radiologist Assistant, and communication documented in the PACS or zVision Dashboard. Electronically Signed   By: Dorise Bullion III M.D   On: 03/20/2015 16:53   Dg Chest Port 1 View  03/31/2015  CLINICAL DATA:  Status post catheter placement EXAM: PORTABLE CHEST 1 VIEW COMPARISON:  03/18/2015 FINDINGS: New dialysis  catheter on the right with tip at the right atrial level. New left IJ catheter which does not cross midline and terminates over the aortic knob. There is no persistent left SVC on chest CT 02/27/2007. This catheter could be arterial or in the IMV. No evidence of pneumothorax. Stable cardiomegaly and upper mediastinal contours for technique. Extensive airspace disease over the right lung. These results were called by telephone at the time of interpretation on 04/01/2015 at 3:10 pm to [place], who verbally acknowledged these results and will communicate with attending. IMPRESSION: 1. Unexpected positioning of left IJ catheter, tip overlapping the aortic knob, as described above. 2. New right-sided dialysis catheter with tip over the right atrium. 3. Unchanged right-sided pneumonia with layering fluid. 4. No pneumothorax. Electronically Signed   By: Monte Fantasia M.D.   On: 03/24/2015 15:13   Dg Fluoro Guide Cv Line-no Report  03/08/2015  CLINICAL DATA:  FLOURO GUIDE CV LINE Fluoroscopy was utilized by the requesting physician.  No radiographic interpretation.    I have reviewed the patient's current medications.  Assessment/Plan: 1 ESRD will do HD . Primarily to clear drug.  Solute ok.   2 HCAP on AB 3 Resp failure Pneu and anesthesia 4 anemia on esa/Fe 5 HPTH vit D when appropriate 6 DM controlled 7 Debill P Hd, esa, Fe, AB, ? Wean, lower pressors    LOS: 5 days   Drenda Sobecki L 03/21/2015,7:42 AM

## 2015-03-21 NOTE — Addendum Note (Signed)
Addendum  created 03/21/15 0650 by Roberts Gaudy, MD   Modules edited: Notes Section   Notes Section:  File: [id number]; Pend: [id number]; Pend: [id number]; Pend: [id number]

## 2015-03-22 ENCOUNTER — Inpatient Hospital Stay (HOSPITAL_COMMUNITY): Payer: Medicare Other

## 2015-03-22 LAB — COMPREHENSIVE METABOLIC PANEL
ALBUMIN: 1.8 g/dL — AB (ref 3.5–5.0)
ALT: 12 U/L — ABNORMAL LOW (ref 14–54)
ANION GAP: 16 — AB (ref 5–15)
AST: 31 U/L (ref 15–41)
Alkaline Phosphatase: 40 U/L (ref 38–126)
BILIRUBIN TOTAL: 0.8 mg/dL (ref 0.3–1.2)
BUN: 21 mg/dL — ABNORMAL HIGH (ref 6–20)
CHLORIDE: 101 mmol/L (ref 101–111)
CO2: 23 mmol/L (ref 22–32)
Calcium: 7.6 mg/dL — ABNORMAL LOW (ref 8.9–10.3)
Creatinine, Ser: 3.21 mg/dL — ABNORMAL HIGH (ref 0.44–1.00)
GFR calc Af Amer: 15 mL/min — ABNORMAL LOW (ref >60)
GFR calc non Af Amer: 13 mL/min — ABNORMAL LOW (ref >60)
GLUCOSE: 86 mg/dL (ref 65–99)
POTASSIUM: 3.6 mmol/L (ref 3.5–5.1)
Sodium: 140 mmol/L (ref 135–145)
TOTAL PROTEIN: 4.6 g/dL — AB (ref 6.5–8.1)

## 2015-03-22 LAB — GLUCOSE, CAPILLARY
GLUCOSE-CAPILLARY: 74 mg/dL (ref 65–99)
GLUCOSE-CAPILLARY: 83 mg/dL (ref 65–99)
GLUCOSE-CAPILLARY: 83 mg/dL (ref 65–99)
GLUCOSE-CAPILLARY: 98 mg/dL (ref 65–99)
Glucose-Capillary: 85 mg/dL (ref 65–99)
Glucose-Capillary: 108 mg/dL — ABNORMAL HIGH (ref 65–99)

## 2015-03-22 LAB — CBC
HEMATOCRIT: 19.5 % — AB (ref 36.0–46.0)
Hemoglobin: 6.6 g/dL — CL (ref 12.0–15.0)
MCH: 28.7 pg (ref 26.0–34.0)
MCHC: 33.8 g/dL (ref 30.0–36.0)
MCV: 84.8 fL (ref 78.0–100.0)
PLATELETS: 152 10*3/uL (ref 150–400)
RBC: 2.3 MIL/uL — ABNORMAL LOW (ref 3.87–5.11)
RDW: 14.7 % (ref 11.5–15.5)
WBC: 17.9 10*3/uL — AB (ref 4.0–10.5)

## 2015-03-22 LAB — MAGNESIUM: Magnesium: 1.6 mg/dL — ABNORMAL LOW (ref 1.7–2.4)

## 2015-03-22 LAB — PROCALCITONIN: Procalcitonin: 7.13 ng/mL

## 2015-03-22 LAB — PREPARE RBC (CROSSMATCH)

## 2015-03-22 LAB — TROPONIN I
TROPONIN I: 0.28 ng/mL — AB (ref <0.031)
Troponin I: 0.21 ng/mL — ABNORMAL HIGH (ref <0.031)
Troponin I: 0.08 ng/mL — ABNORMAL HIGH (ref <0.031)

## 2015-03-22 LAB — HEMOGLOBIN AND HEMATOCRIT, BLOOD
HCT: 24.3 % — ABNORMAL LOW (ref 36.0–46.0)
HEMOGLOBIN: 8.3 g/dL — AB (ref 12.0–15.0)

## 2015-03-22 LAB — PHOSPHORUS: PHOSPHORUS: 4.1 mg/dL (ref 2.5–4.6)

## 2015-03-22 LAB — ABO/RH: ABO/RH(D): O POS

## 2015-03-22 MED ORDER — CHLORHEXIDINE GLUCONATE 0.12% ORAL RINSE (MEDLINE KIT)
15.0000 mL | Freq: Two times a day (BID) | OROMUCOSAL | Status: DC
  Administered 2015-03-22 – 2015-03-29 (×14): 15 mL via OROMUCOSAL

## 2015-03-22 MED ORDER — SODIUM CHLORIDE 0.9 % IV SOLN
Freq: Once | INTRAVENOUS | Status: AC
  Administered 2015-03-22: 09:00:00 via INTRAVENOUS

## 2015-03-22 MED ORDER — ANTISEPTIC ORAL RINSE SOLUTION (CORINZ)
7.0000 mL | OROMUCOSAL | Status: DC
  Administered 2015-03-22 – 2015-03-29 (×64): 7 mL via OROMUCOSAL

## 2015-03-22 MED ORDER — IOHEXOL 300 MG/ML  SOLN
25.0000 mL | INTRAMUSCULAR | Status: AC
  Administered 2015-03-22 (×2): 25 mL via ORAL

## 2015-03-22 NOTE — Progress Notes (Signed)
SLP Cancellation Note  Patient Details Name: Stacey Zamora MRN: [id number] DOB: 03-Mar-1936   Cancelled treatment:       Reason Eval/Treat Not Completed: Medical issues which prohibited therapy. Pt intubated. Will sign off. Please order again when pt ready.    Caesar Mannella, Katherene Ponto 03/22/2015, 9:43 AM

## 2015-03-22 NOTE — Progress Notes (Signed)
Subjective: Interval History:  Awake and notes stom pain. Events of last eve noted, reintub, .  Objective: Vital signs in last 24 hours: Temp:  [97.4 F (36.3 C)-98.8 F (37.1 C)] 97.8 F (36.6 C) (01/19 0400) Pulse Rate:  [47-103] 84 (01/19 0700) Resp:  [16-50] 20 (01/19 0700) BP: (83-172)/(30-94) 124/43 mmHg (01/19 0630) SpO2:  [45 %-100 %] 100 % (01/19 0744) FiO2 (%):  [40 %-100 %] 40 % (01/19 0744) Weight:  [88.3 kg (194 lb 10.7 oz)-90.7 kg (199 lb 15.3 oz)] 88.3 kg (194 lb 10.7 oz) (01/19 0425) Weight change: 4.9 kg (10 lb 12.9 oz)  Intake/Output from previous day: 01/18 0701 - 01/19 0700 In: 1220.8 [I.V.:1070.8; IV Piggyback:150] Out: 60 [Urine:60] Intake/Output this shift:    General appearance: cooperative, no distress and slowed mentation Resp: rales bibasilar and rhonchi bibasilar and R>L Chest wall: RIJ PC Cardio: S1, S2 normal and systolic murmur: holosystolic 2/6, blowing at apex GI: no bs but soft Extremities: edema 1+, LUA AVF  Lab Results:  Recent Labs  03/21/15 1610 03/22/15 0415  WBC 17.4* 17.9*  HGB 7.5* 6.6*  HCT 21.3* 19.5*  PLT 160 152   BMET:  Recent Labs  03/21/15 1610 03/22/15 0415  NA 138 140  K 3.2* 3.6  CL 101 101  CO2 21* 23  GLUCOSE 95 86  BUN 35* 21*  CREATININE 4.21* 3.21*  CALCIUM 7.8* 7.6*   No results for input(s): PTH in the last 72 hours. Iron Studies:  Recent Labs  04/03/2015 2145  IRON 30  TIBC 123*    Studies/Results: Dg Chest Port 1 View  03/21/2015  CLINICAL DATA:  Endotracheal tube placement EXAM: PORTABLE CHEST 1 VIEW COMPARISON:  03/21/2015 FINDINGS: Cardiomediastinal silhouette is stable. Stable right pleural effusion. Persistent streaky opacification of the right hemithorax suspicious for asymmetric pneumonia or less likely pulmonary edema. Endotracheal tube in place with tip 4.7 cm above the carina. Right IJ dialysis catheter with tip in right atrium is unchanged in position. Again noted left central  catheter with tip in the region of aortic arch. Arterial placement cannot be excluded. Mild left basilar atelectasis. IMPRESSION: Stable right pleural effusion. Persistent streaky opacification of the right hemithorax suspicious for asymmetric pneumonia or less likely pulmonary edema. Endotracheal tube in place with tip 4.7 cm above the carina. Right IJ dialysis catheter with tip in right atrium is unchanged in position. Again noted left central catheter with tip in the region of aortic arch. Arterial placement cannot be excluded. Electronically Signed   By: Lahoma Crocker M.D.   On: 03/21/2015 18:07   Dg Chest Port 1 View  03/21/2015  CLINICAL DATA:  Dialysis catheter insertion, acute respiratory failure, hypertension, diabetes mellitus, end- stage renal disease EXAM: PORTABLE CHEST 1 VIEW COMPARISON:  Portable exam 0602 hours compared to 03/20/2015 FINDINGS: Tip of endotracheal tube projects 3.8 cm above carina. LEFT jugular central venous catheter tip projects over aortic arch. RIGHT jugular dialysis catheter tip projects over inferior RIGHT atrium. Enlargement of cardiac silhouette with pulmonary vascular congestion. Asymmetric pulmonary infiltrates likely representing asymmetric edema increased on LEFT since previous exam. RIGHT basilar pleural effusion identified. No pneumothorax. IMPRESSION: Increased asymmetric pulmonary infiltrates RIGHT greater than LEFT question asymmetric edema. Persistent RIGHT pleural effusion. LEFT jugular line tip remains projecting over the aortic arch, cannot exclude intra-arterial placement ; catheter has been present at this position since 03/26/2015. Recommend confirmation of venous rather than arterial position of catheter. Findings called to [employer] on [facility] on 03/21/2015  at 0806 hours. Electronically Signed   By: Lavonia Dana M.D.   On: 03/21/2015 08:07   Dg Abd Portable 1v  03/21/2015  CLINICAL DATA:  Abdominal pain. EXAM: PORTABLE ABDOMEN - 1 VIEW COMPARISON:  CT of  the abdomen and pelvis on 11/02/2007 FINDINGS: Visualized bowel gas shows no overt obstruction. Mild gas in multiple small bowel loops may be consistent with a component of ileus or enteritis. No abnormal calcifications are seen. Visualized bony structures show spondylosis of the lumbar spine. IMPRESSION: Possible component of small bowel ileus/enteritis. No evidence of bowel obstruction. Electronically Signed   By: Aletta Edouard M.D.   On: 03/21/2015 18:07    I have reviewed the patient's current medications.  Assessment/Plan: 1 ESRD Hd yest and solute ok. No vol off as bp low. Acid/base better..  Will plan tomorrow 2 VDRF pneu, xs vol , ?? Other 3 Pancreatitis 4 Abdm ??? Process, has #3 but ?? Other. May need CT 5 DM controlled 6 Malnutrition 7 HL 8 Debill 9 Anemia lower to get blood, On ESA, Fe P HD in am, AB, ?CT,  Esa, Fe    LOS: 6 days   Stacey Zamora L 03/22/2015,8:10 AM

## 2015-03-22 NOTE — Progress Notes (Signed)
PULMONARY / CRITICAL CARE MEDICINE   Name: Stacey Zamora MRN: [id number] DOB: [birth year]    ADMISSION DATE:  03/14/2015 CONSULTATION DATE:  03/08/2015  REFERRING MD :  Anesthesiology/ surgery  PRIMARY SERVICE: CCM  HISTORY OF PRESENT ILLNESS:  Patient is intubated. History obtained from anesthesiologist Dr. Linna Caprice and [facility] documentation.  Patient is a 80 yo F with a PMHx of HTN, asthma, breast ca, DM, HLD, CKD stage 3 who was found unresponsive after HD cath placement today. Patient was admitted for acute hypoxic respiratory failure from PNA. She underwent procedure for RIJ cath and LUA AVF by vascular surgery this afternoon. As per anesthesiology, patient received Versed 4 mg, Fentanyl 50 mcg, Neo-synephrine 4770 mcg, Precedex 69 mcg, and Etomidate 20 mg during the procedure. She continued to be hypotensive during the procedure. Amount of blood loss is not reported in EPIC. Patient was receiving IV Lasix 160 mg q6 prior to the procedure.   As per anesthesiologist and nursing staff, when patient initially came into the PACU after the procedure she was arousable and following commands, however, was hypotensive with systolic in the [id number]. She was given Neo-synephrine in the PACU and subsequently became more obtunded and then unresponsive. She was also given Albumin 5% 12.5 g and Calcium 1g. Patient was intubated by anesthesiology to protect her airway. She was given Etomidate 8 mg and Succinylcholine 100 mg during the process. No Precedex was administered. ABG was obtained 30 mins after intubation and showed pH 7.25, PCO2 52.6, and PO2 261. Labs were also obtained at this time and revealed Na 142, K 4.2, H/H 8.2/24. Patient was continued on Neo-synephrine 30 mcg/ min for hypotension.   PAST MEDICAL HISTORY :  Past Medical History  Diagnosis Date  . Hypertension   . Asthma   . Shortness of breath   . Cancer ([place])     right breast  . GERD (gastroesophageal reflux disease)   . Arthritis   . Pneumonia    . Diabetes mellitus   . HLD (hyperlipidemia)   . CKD (chronic kidney disease), stage III    Past Surgical History  Procedure Laterality Date  . Wrist surgery    . Breast surgery    . Abdominal hysterectomy    . Cholecystectomy    . Carpel tunnel    . Insertion of dialysis catheter N/A 04/03/2015    Procedure: INSERTION OF DIALYSIS CATHETER RIGHT INTERNAL JUGULAR VEIN;  Surgeon: Elam Dutch, MD;  Location: [facility];  Service: Vascular;  Laterality: N/A;  . Av fistula placement Left 03/12/2015    Procedure: CREATION OF LEFT UPPER ARM  ARTERIOVENOUS (AV) FISTULA ;  Surgeon: Elam Dutch, MD;  Location: [place];  Service: Vascular;  Laterality: Left;   Prior to Admission medications   Medication Sig Start Date End Date Taking? Authorizing Provider  ADVAIR DISKUS 250-50 MCG/DOSE AEPB Inhale 1 puff into the lungs 2 (two) times daily. 11/23/14  Yes Historical Provider, MD  amitriptyline (ELAVIL) 50 MG tablet Take 50 mg by mouth at bedtime.     Yes Historical Provider, MD  benzonatate (TESSALON) 100 MG capsule Take 1 capsule (100 mg total) by mouth 3 (three) times daily as needed for cough. 12/21/14  Yes Maryann Mikhail, DO  BYSTOLIC 10 MG tablet Take 10 mg by mouth daily. 12/07/14  Yes Historical Provider, MD  docusate sodium (COLACE) 100 MG capsule Take 1 capsule (100 mg total) by mouth 2 (two) times daily. Patient taking differently: Take 100 mg by  mouth 2 (two) times daily as needed for moderate constipation.  12/21/14  Yes Maryann Mikhail, DO  feeding supplement, GLUCERNA SHAKE, (GLUCERNA SHAKE) LIQD Take 237 mLs by mouth 3 (three) times daily between meals. 12/21/14  Yes Maryann Mikhail, DO  fluticasone (FLONASE) 50 MCG/ACT nasal spray Place 2 sprays into the nose daily as needed for allergies.    Yes Historical Provider, MD  gabapentin (NEURONTIN) 100 MG capsule Take 100 mg by mouth 3 (three) times daily. 11/23/14  Yes Historical Provider, MD  hydrOXYzine (ATARAX/VISTARIL) 25 MG tablet  Take 25 mg by mouth daily as needed. Itching, anxiety 02/15/15  Yes Historical Provider, MD  ipratropium (ATROVENT) 0.03 % nasal spray Place 1 spray into both nostrils daily as needed. allergies 02/24/15  Yes Historical Provider, MD  loratadine (CLARITIN) 10 MG tablet Take 10 mg by mouth daily.     Yes Historical Provider, MD  losartan-hydrochlorothiazide (HYZAAR) 100-12.5 MG tablet Take 1 tablet by mouth daily. 02/24/15  Yes Historical Provider, MD  magnesium oxide (MAG-OX) 400 (241.3 MG) MG tablet Take 1 tablet by mouth daily. 11/23/14  Yes Historical Provider, MD  megestrol (MEGACE) 400 MG/10ML suspension Take 5 mLs (200 mg total) by mouth daily. 12/21/14  Yes Maryann Mikhail, DO  omeprazole (PRILOSEC) 20 MG capsule Take 40 mg by mouth daily.     Yes Historical Provider, MD  oxyCODONE-acetaminophen (PERCOCET/ROXICET) 5-325 MG tablet Take 1 tablet by mouth every 4 (four) hours as needed. pain 01/23/15  Yes Historical Provider, MD  pioglitazone (ACTOS) 30 MG tablet Take 30 mg by mouth daily. 11/01/14  Yes Historical Provider, MD  potassium chloride (K-DUR) 10 MEQ tablet Take 10 mEq by mouth daily. 12/28/14  Yes Historical Provider, MD  simvastatin (ZOCOR) 20 MG tablet Take 20 mg by mouth daily. 02/21/15  Yes Historical Provider, MD  traMADol (ULTRAM) 50 MG tablet Take 1 tablet (50 mg total) by mouth every 6 (six) hours as needed. Patient taking differently: Take 50 mg by mouth every 6 (six) hours as needed for moderate pain.  09/25/13  Yes Gregor Hams, MD  traZODone (DESYREL) 50 MG tablet Take 50 mg by mouth at bedtime as needed for sleep.  11/23/14  Yes Historical Provider, MD  VENTOLIN HFA 108 (90 BASE) MCG/ACT inhaler Inhale 2 puffs into the lungs every 4 (four) hours as needed for wheezing or shortness of breath.  11/23/14  Yes Historical Provider, MD  levofloxacin (LEVAQUIN) 500 MG tablet Take 500 mg by mouth daily. Reported on 03/09/2015 03/07/15   Historical Provider, MD   Allergies  Allergen  Reactions  . Eggs Or Egg-Derived Products Hives  . Morphine And Related Itching and Other (See Comments)    Crawling sensations  . Vicodin [Hydrocodone-Acetaminophen] Itching and Other (See Comments)    Crawling sensations  . Banana Other (See Comments)    Pt does not eat bananas  . Levaquin [Levofloxacin] Other (See Comments)    Hallucinations: Pt was to take 10 day course, but began to hallucinate after day 8.  . Shellfish Allergy Other (See Comments)    Pt doesn't eat SeaFood at all. No Fish, no Shrimp, etc    FAMILY HISTORY:  Family History  Problem Relation Age of Onset  . Diabetes Mellitus II Daughter   . Cancer Mother   . Dementia Mother    SOCIAL HISTORY:  reports that she has been smoking E-cigarettes.  She has quit using smokeless tobacco. She reports that she does not drink alcohol or use illicit  drugs.  REVIEW OF SYSTEMS:   Denies any abdominal pain, dyspnea,. Chest pain, palpitations.   SUBJECTIVE:  Extubated yesterday and then re intubated for resp failure, abd pain.  VITAL SIGNS: Temp:  [97.4 F (36.3 C)-98.8 F (37.1 C)] 98.8 F (37.1 C) (01/19 0915) Pulse Rate:  [47-103] 86 (01/19 0915) Resp:  [16-50] 22 (01/19 0915) BP: (83-172)/(30-94) 102/39 mmHg (01/19 0915) SpO2:  [45 %-100 %] 100 % (01/19 0915) FiO2 (%):  [40 %-100 %] 40 % (01/19 0800) Weight:  [194 lb 10.7 oz (88.3 kg)-199 lb 15.3 oz (90.7 kg)] 194 lb 10.7 oz (88.3 kg) (01/19 0425) HEMODYNAMICS: CVP:  [4 mmHg-6 mmHg] 6 mmHg VENTILATOR SETTINGS: Vent Mode:  [-] PSV;CPAP FiO2 (%):  [40 %-100 %] 40 % Set Rate:  [16 bmp] 16 bmp Vt Set:  [500 mL] 500 mL PEEP:  [5 cmH20] 5 cmH20 Pressure Support:  [10 cmH20] 10 cmH20 Plateau Pressure:  [19 cmH20-23 cmH20] 19 cmH20 INTAKE / OUTPUT: Intake/Output      01/18 0701 - 01/19 0700 01/19 0701 - 01/20 0700   I.V. (mL/kg) 1070.8 (12.1) 148.4 (1.7)   Blood  34   IV Piggyback 150    Total Intake(mL/kg) 1220.8 (13.8) 182.4 (2.1)   Urine (mL/kg/hr) 60 (0)     Other 0 (0)    Total Output 60     Net +1160.8 +182.4          PHYSICAL EXAMINATION: General:  Awake, no distress. Neuro: Opens eyes to commands, Moves all 4 extremities HEENT: Oral ETT. Moist mucus membranes Cardiovascular: RRR. S1, S2, No MRG Lungs: Clear, No wheeze or crackles. Abdomen: Soft. +BS. Non tender, non distended Skin:  Intact.  LABS:  CBC  Recent Labs Lab 03/21/15 0446 03/21/15 1610 03/22/15 0415  WBC 19.6* 17.4* 17.9*  HGB 7.7* 7.5* 6.6*  HCT 22.2* 21.3* 19.5*  PLT 147* 160 152   Coag's  Recent Labs Lab 03/16/15 0255  APTT 29  INR 1.26   BMET  Recent Labs Lab 03/21/15 0446 03/21/15 1610 03/22/15 0415  NA 140 138 140  K 3.5 3.2* 3.6  CL 101 101 101  CO2 22 21* 23  BUN 33* 35* 21*  CREATININE 3.70* 4.21* 3.21*  GLUCOSE 105* 95 86   Electrolytes  Recent Labs Lab 03/18/15 1821  03/21/15 0446 03/21/15 1610 03/22/15 0415  CALCIUM  --   < > 7.8* 7.8* 7.6*  MG 1.1*  --  1.5*  --  1.6*  PHOS  --   < > 4.5 5.2* 4.1  < > = values in this interval not displayed. Sepsis Markers  Recent Labs Lab 03/16/15 0255  03/16/15 2352 03/21/15 1619 03/21/15 1916  LATICACIDVEN  --   < > 3.1* 1.5 1.5  PROCALCITON 3.69  --   --   --   --   < > = values in this interval not displayed. ABG  Recent Labs Lab 03/18/15 0134 03/14/2015 1712 03/21/15 0955  PHART 7.384 7.251* 7.419  PCO2ART 28.6* 52.6* 33.1*  PO2ART 62.6* 261.0* 84.0   Liver Enzymes  Recent Labs Lab 03/20/15 0330 03/21/15 1610 03/22/15 0415  AST 27 31 31   ALT 11* 12* 12*  ALKPHOS 41 38 40  BILITOT 0.8 0.7 0.8  ALBUMIN 2.1* 1.9* 1.8*   Cardiac Enzymes  Recent Labs Lab 03/21/15 1715 03/21/15 2310 03/22/15 0415  TROPONINI 0.23* 0.21* 0.28*   Glucose  Recent Labs Lab 03/21/15 1333 03/21/15 1732 03/21/15 1943 03/22/15 0002 03/22/15 0430 03/22/15 [id number]  GLUCAP 84 86 77 74 83 83    Imaging Dg Chest Port 1 View  03/21/2015  CLINICAL DATA:  Endotracheal tube  placement EXAM: PORTABLE CHEST 1 VIEW COMPARISON:  03/21/2015 FINDINGS: Cardiomediastinal silhouette is stable. Stable right pleural effusion. Persistent streaky opacification of the right hemithorax suspicious for asymmetric pneumonia or less likely pulmonary edema. Endotracheal tube in place with tip 4.7 cm above the carina. Right IJ dialysis catheter with tip in right atrium is unchanged in position. Again noted left central catheter with tip in the region of aortic arch. Arterial placement cannot be excluded. Mild left basilar atelectasis. IMPRESSION: Stable right pleural effusion. Persistent streaky opacification of the right hemithorax suspicious for asymmetric pneumonia or less likely pulmonary edema. Endotracheal tube in place with tip 4.7 cm above the carina. Right IJ dialysis catheter with tip in right atrium is unchanged in position. Again noted left central catheter with tip in the region of aortic arch. Arterial placement cannot be excluded. Electronically Signed   By: Lahoma Crocker M.D.   On: 03/21/2015 18:07   Dg Chest Port 1 View  03/21/2015  CLINICAL DATA:  Dialysis catheter insertion, acute respiratory failure, hypertension, diabetes mellitus, end- stage renal disease EXAM: PORTABLE CHEST 1 VIEW COMPARISON:  Portable exam 0602 hours compared to 03/20/2015 FINDINGS: Tip of endotracheal tube projects 3.8 cm above carina. LEFT jugular central venous catheter tip projects over aortic arch. RIGHT jugular dialysis catheter tip projects over inferior RIGHT atrium. Enlargement of cardiac silhouette with pulmonary vascular congestion. Asymmetric pulmonary infiltrates likely representing asymmetric edema increased on LEFT since previous exam. RIGHT basilar pleural effusion identified. No pneumothorax. IMPRESSION: Increased asymmetric pulmonary infiltrates RIGHT greater than LEFT question asymmetric edema. Persistent RIGHT pleural effusion. LEFT jugular line tip remains projecting over the aortic arch, cannot  exclude intra-arterial placement ; catheter has been present at this position since 03/09/2015. Recommend confirmation of venous rather than arterial position of catheter. Findings called to [place] on [facility] on 03/21/2015 at 0806 hours. Electronically Signed   By: Lavonia Dana M.D.   On: 03/21/2015 08:07   Dg Abd Portable 1v  03/21/2015  CLINICAL DATA:  Abdominal pain. EXAM: PORTABLE ABDOMEN - 1 VIEW COMPARISON:  CT of the abdomen and pelvis on 11/02/2007 FINDINGS: Visualized bowel gas shows no overt obstruction. Mild gas in multiple small bowel loops may be consistent with a component of ileus or enteritis. No abnormal calcifications are seen. Visualized bony structures show spondylosis of the lumbar spine. IMPRESSION: Possible component of small bowel ileus/enteritis. No evidence of bowel obstruction. Electronically Signed   By: Aletta Edouard M.D.   On: 03/21/2015 18:07    ASSESSMENT / PLAN:  PULMONARY A: Acute hypoxic hypercarbic respiratory failure likely from PNA CAP on vanc/ zosyn 1/13, now on Zosyn   Hx of Asthma   P:   -Continue mechanical ventilation -PRN Fentanyl - Continue nebs  - On Zosyn. Add vanco  CARDIOVASCULAR A: Hypotension during AVF and HD cath placement - likely due to Precedex gtt in the setting of pulmonary HTN. Echo showing high PA pressure.   Elevated troponin - EKG normal  Sepsis from PNA Afib P:  - On Amio for afib. Holding heparin for recent lower GIB. - Monitor hemodynamics  - Lt IJ CVL placement uncertain since it does not cross the midline. However measured CVP is 5.  Venous sat 89% while arterial sat at same time is 97%. I believe the line is venous. Expect higher sats on venous side given  the AV fistula.  RENAL A:  AKI superimposed on chronic kidney disease - needs dialysis Metabolic acidosis P:   -HD today as per renal  GASTROINTESTINAL A:  Hx of GERD Reccurent abd pain  Pancreatitis ? Ileus,  P:   - Protonix  -CT of abd with oral  contrast.  HEMATOLOGIC A:  Anemia of chronic disease from ESRD and possible acute blood loss from surgery. Lower GI bleed- ? hemmoroids P:  -Monitor CBC -Transfuse if Hgb <7. Will get 1 unit today  INFECTIOUS A: Sepsis from pneumonia  Leukocytosis  P:   -Monitor CBC -Pending blood cultures  -Continue Zosyn. Check procalcitonin.   ENDOCRINE A:  HX of DM2 P:   -Monitor CBGs -SSI  NEUROLOGIC A:  Acute encephalopathy 2/2 pneumonia, sepsis, renal failure, respiratory failure  AMS in the setting of hypotension P:   -Continue to monitor  -Continue mechanical ventilation to protect airway.  TODAY'S SUMMARY:   Critical care time- 45 mins. Family updated at bedside.   Marshell Garfinkel MD [facility] Pager [phone] If no answer or after 3pm call: [phone] 03/22/2015, 10:10 AM

## 2015-03-22 NOTE — Progress Notes (Signed)
[facility] Progress Note Patient Name: Stacey Zamora DOB: 1935/11/26 MRN: [id number]   Date of Service  03/22/2015  HPI/Events of Note    eICU Interventions  One unit PRBCs for Hgb 6.6     Intervention Category Major Interventions: Other:  Merton Border 03/22/2015, 5:16 AM

## 2015-03-22 NOTE — Progress Notes (Signed)
CRITICAL VALUE ALERT  Critical value received:  Hbg 6.6  Date of notification:  03/22/15  Time of notification:  0450  Critical value read back: Yes  Nurse who received alert:  Norberta Keens, RN  MD notified (1st page):  Dr. Alva Garnet  Time of first page:  0510  MD notified (2nd page):  Time of second page:  Responding MD:  Dr. Alva Garnet  Time MD responded:  [phone]

## 2015-03-22 NOTE — Progress Notes (Signed)
Patient transported to CT and back on ventilator by this RT and RN and transporter. Vitals stable.

## 2015-03-22 NOTE — Progress Notes (Addendum)
Pharmacy Antibiotic Follow-up Note  Stacey Zamora is a 80 y.o. year-old female admitted on 03/29/2015.  The patient is currently on : day # 7 ZOSYN for PNA RLL, sepsis.  Acute hypoxic hypercarbic respiratory failure likely from PNA. Continues on  mechanical ventilation Afebrile, WBC 17.9K  -Noted s/p HD on 1/18  (BFR 500+ for 3hrs)  and plan for HD today  ANTIBIOTICS: Zosyn 1/13>>  Vanc 1/13 >> 1/17 Outpt LVQ 1/2>>1/9. planned thru 1/12 but stopped d/t hallucinatins after 8 days   Microbiology: 1/13 blood x 2 - neg/ final 1/13 influenza neg 1/14 MRSA screen neg 1/18 Cdiff toxin: neg   Plan:  Continue Zosyn 2.25 gm IV q8hr Follow up on cultures, clinical status daily.    Temp (24hrs), Avg:97.9 F (36.6 C), Min:97.4 F (36.3 C), Max:98.8 F (37.1 C)   Recent Labs Lab 03/05/2015 2245 03/20/15 0330 03/21/15 0446 03/21/15 1610 03/22/15 0415  WBC 19.3* 17.6* 19.6* 17.4* 17.9*     Recent Labs Lab 03/20/2015 0527 03/20/15 0330 03/21/15 0446 03/21/15 1610 03/22/15 0415  CREATININE 5.07* 2.78* 3.70* 4.21* 3.21*   Estimated Creatinine Clearance: 15.2 mL/min (by C-G formula based on Cr of 3.21).    Allergies  Allergen Reactions  . Eggs Or Egg-Derived Products Hives  . Morphine And Related Itching and Other (See Comments)    Crawling sensations  . Vicodin [Hydrocodone-Acetaminophen] Itching and Other (See Comments)    Crawling sensations  . Banana Other (See Comments)    Pt does not eat bananas  . Levaquin [Levofloxacin] Other (See Comments)    Hallucinations: Pt was to take 10 day course, but began to hallucinate after day 8.  . Shellfish Allergy Other (See Comments)    Pt doesn't eat SeaFood at all. No Fish, no Shrimp, etc     Thank you for allowing pharmacy to be a part of this patient's care.  Nicole Cella, RPh Clinical Pharmacist Pager: [phone]  03/22/2015 10:12 AM

## 2015-03-23 ENCOUNTER — Inpatient Hospital Stay (HOSPITAL_COMMUNITY): Payer: Medicare Other

## 2015-03-23 LAB — COMPREHENSIVE METABOLIC PANEL
ALT: 12 U/L — AB (ref 14–54)
AST: 30 U/L (ref 15–41)
Albumin: 1.8 g/dL — ABNORMAL LOW (ref 3.5–5.0)
Alkaline Phosphatase: 47 U/L (ref 38–126)
Anion gap: 12 (ref 5–15)
BILIRUBIN TOTAL: 0.9 mg/dL (ref 0.3–1.2)
BUN: 25 mg/dL — ABNORMAL HIGH (ref 6–20)
CHLORIDE: 98 mmol/L — AB (ref 101–111)
CO2: 22 mmol/L (ref 22–32)
CREATININE: 4.07 mg/dL — AB (ref 0.44–1.00)
Calcium: 7.7 mg/dL — ABNORMAL LOW (ref 8.9–10.3)
GFR, EST AFRICAN AMERICAN: 11 mL/min — AB (ref >60)
GFR, EST NON AFRICAN AMERICAN: 10 mL/min — AB (ref >60)
Glucose, Bld: 99 mg/dL (ref 65–99)
Potassium: 3.3 mmol/L — ABNORMAL LOW (ref 3.5–5.1)
Sodium: 132 mmol/L — ABNORMAL LOW (ref 135–145)
TOTAL PROTEIN: 4.8 g/dL — AB (ref 6.5–8.1)

## 2015-03-23 LAB — GASTROINTESTINAL PANEL BY PCR, STOOL (REPLACES STOOL CULTURE)
ASTROVIRUS: NOT DETECTED
Adenovirus F40/41: NOT DETECTED
CYCLOSPORA CAYETANENSIS: NOT DETECTED
Campylobacter species: NOT DETECTED
Cryptosporidium: NOT DETECTED
E. COLI O157: NOT DETECTED
ENTEROTOXIGENIC E COLI (ETEC): NOT DETECTED
Entamoeba histolytica: NOT DETECTED
Enteroaggregative E coli (EAEC): NOT DETECTED
Enteropathogenic E coli (EPEC): NOT DETECTED
Giardia lamblia: NOT DETECTED
NOROVIRUS GI/GII: NOT DETECTED
Plesimonas shigelloides: NOT DETECTED
ROTAVIRUS A: NOT DETECTED
SAPOVIRUS (I, II, IV, AND V): NOT DETECTED
SHIGA LIKE TOXIN PRODUCING E COLI (STEC): NOT DETECTED
Salmonella species: NOT DETECTED
Shigella/Enteroinvasive E coli (EIEC): NOT DETECTED
Vibrio cholerae: NOT DETECTED
Vibrio species: NOT DETECTED
Yersinia enterocolitica: NOT DETECTED

## 2015-03-23 LAB — VANCOMYCIN, RANDOM: VANCOMYCIN RM: 13 ug/mL

## 2015-03-23 LAB — TYPE AND SCREEN
ABO/RH(D): O POS
Antibody Screen: NEGATIVE
Unit division: 0

## 2015-03-23 LAB — CBC
HCT: 27.8 % — ABNORMAL LOW (ref 36.0–46.0)
HEMOGLOBIN: 9.4 g/dL — AB (ref 12.0–15.0)
MCH: 28.2 pg (ref 26.0–34.0)
MCHC: 33.8 g/dL (ref 30.0–36.0)
MCV: 83.5 fL (ref 78.0–100.0)
PLATELETS: 143 10*3/uL — AB (ref 150–400)
RBC: 3.33 MIL/uL — ABNORMAL LOW (ref 3.87–5.11)
RDW: 14.8 % (ref 11.5–15.5)
WBC: 13.2 10*3/uL — ABNORMAL HIGH (ref 4.0–10.5)

## 2015-03-23 LAB — GLUCOSE, CAPILLARY
GLUCOSE-CAPILLARY: 103 mg/dL — AB (ref 65–99)
GLUCOSE-CAPILLARY: 81 mg/dL (ref 65–99)
GLUCOSE-CAPILLARY: 82 mg/dL (ref 65–99)
GLUCOSE-CAPILLARY: 97 mg/dL (ref 65–99)
Glucose-Capillary: 99 mg/dL (ref 65–99)
Glucose-Capillary: 83 mg/dL (ref 65–99)
Glucose-Capillary: 86 mg/dL (ref 65–99)

## 2015-03-23 LAB — TROPONIN I
TROPONIN I: 0.09 ng/mL — AB (ref <0.031)
TROPONIN I: 0.29 ng/mL — AB (ref <0.031)

## 2015-03-23 LAB — MAGNESIUM: MAGNESIUM: 1.5 mg/dL — AB (ref 1.7–2.4)

## 2015-03-23 LAB — PHOSPHORUS: PHOSPHORUS: 4.3 mg/dL (ref 2.5–4.6)

## 2015-03-23 LAB — PROCALCITONIN: PROCALCITONIN: 7.96 ng/mL

## 2015-03-23 MED ORDER — FENTANYL CITRATE (PF) 100 MCG/2ML IJ SOLN
INTRAMUSCULAR | Status: AC
  Administered 2015-03-23: 100 ug via INTRAVENOUS
  Filled 2015-03-23: qty 2

## 2015-03-23 MED ORDER — PHENYLEPHRINE HCL 10 MG/ML IJ SOLN
0.0000 ug/min | INTRAVENOUS | Status: DC
  Administered 2015-03-23: 65 ug/min via INTRAVENOUS
  Administered 2015-03-23: 35 ug/min via INTRAVENOUS
  Administered 2015-03-23: 60 ug/min via INTRAVENOUS
  Administered 2015-03-24: 40 ug/min via INTRAVENOUS
  Filled 2015-03-23 (×6): qty 4

## 2015-03-23 MED ORDER — VANCOMYCIN HCL 500 MG IV SOLR
500.0000 mg | Freq: Once | INTRAVENOUS | Status: DC
  Filled 2015-03-23: qty 500

## 2015-03-23 MED ORDER — VANCOMYCIN HCL IN DEXTROSE 1-5 GM/200ML-% IV SOLN
1000.0000 mg | Freq: Once | INTRAVENOUS | Status: AC
  Administered 2015-03-23: 1000 mg via INTRAVENOUS
  Filled 2015-03-23: qty 200

## 2015-03-23 MED ORDER — POTASSIUM CHLORIDE 10 MEQ/50ML IV SOLN
10.0000 meq | INTRAVENOUS | Status: AC
  Administered 2015-03-23 (×2): 10 meq via INTRAVENOUS
  Filled 2015-03-23 (×2): qty 50

## 2015-03-23 MED ORDER — FENTANYL CITRATE (PF) 100 MCG/2ML IJ SOLN
100.0000 ug | Freq: Once | INTRAMUSCULAR | Status: AC
  Administered 2015-03-23: 100 ug via INTRAVENOUS

## 2015-03-23 MED ORDER — ALTEPLASE 2 MG IJ SOLR
2.0000 mg | Freq: Once | INTRAMUSCULAR | Status: DC | PRN
Start: 2015-03-23 — End: 2015-03-26
  Filled 2015-03-23: qty 2

## 2015-03-23 MED ORDER — LIDOCAINE-PRILOCAINE 2.5-2.5 % EX CREA
1.0000 "application " | TOPICAL_CREAM | CUTANEOUS | Status: DC | PRN
  Filled 2015-03-23: qty 5

## 2015-03-23 MED ORDER — MIDAZOLAM HCL 2 MG/2ML IJ SOLN
INTRAMUSCULAR | Status: AC
  Administered 2015-03-23: 2 mg via INTRAVENOUS
  Filled 2015-03-23: qty 2

## 2015-03-23 MED ORDER — PENTAFLUOROPROP-TETRAFLUOROETH EX AERO: 1.0000 "application " | INHALATION_SPRAY | CUTANEOUS | Status: DC | PRN

## 2015-03-23 MED ORDER — SODIUM CHLORIDE 0.9 % IV SOLN: 100.0000 mL | INTRAVENOUS | Status: DC | PRN

## 2015-03-23 MED ORDER — HEPARIN SODIUM (PORCINE) 1000 UNIT/ML DIALYSIS: 40.0000 [IU]/kg | Freq: Once | INTRAMUSCULAR | Status: DC

## 2015-03-23 MED ORDER — POTASSIUM CHLORIDE 10 MEQ/100ML IV SOLN
10.0000 meq | INTRAVENOUS | Status: DC
  Filled 2015-03-23: qty 100

## 2015-03-23 MED ORDER — LIDOCAINE HCL (PF) 1 % IJ SOLN: 5.0000 mL | INTRAMUSCULAR | Status: DC | PRN

## 2015-03-23 MED ORDER — HEPARIN SODIUM (PORCINE) 1000 UNIT/ML DIALYSIS: 1000.0000 [IU] | INTRAMUSCULAR | Status: DC | PRN

## 2015-03-23 MED ORDER — MIDAZOLAM HCL 2 MG/2ML IJ SOLN
2.0000 mg | Freq: Once | INTRAMUSCULAR | Status: AC
  Administered 2015-03-23: 2 mg via INTRAVENOUS

## 2015-03-23 NOTE — Procedures (Signed)
PCCM Video Bronchoscopy Procedure Note  The patient was informed of the risks (including but not limited to bleeding, infection, respiratory failure, lung injury, tooth/oral injury) and benefits of the procedure and gave consent, see chart.  Indication: RLL, RML collapse, pneumonia  Post Procedure Diagnosis: Same  Location: RLL, RML  Condition pre procedure: Stable  Medications for procedure: Versed, fentanyl  Procedure description: The bronchoscope was introduced through the endotracheal tube and passed to the bilateral lungs to the level of the subsegmental bronchi throughout the tracheobronchial tree.  Airway exam revealed airway edema and narrowing of bronchus intermedius and entrances to RLL and RML. Purulent secretions from these lobes.   Procedures performed: RML and RLL lavage and clearance of secretions.   Specimens sent: Cultures, cytology, cell count.  Condition post procedure: Stable.  EBL: 0  Complications: None.  Marshell Garfinkel MD [facility] Pager [phone] If no answer or after 3pm call: [phone] 03/23/2015, 11:55 AM

## 2015-03-23 NOTE — Progress Notes (Signed)
PULMONARY / CRITICAL CARE MEDICINE   Name: Stacey Zamora MRN: [id number] DOB: December 22, 1935    ADMISSION DATE:  03/09/2015 CONSULTATION DATE:  03/05/2015  REFERRING MD :  Anesthesiology/ surgery  PRIMARY SERVICE: CCM  HISTORY OF PRESENT ILLNESS:  Patient is intubated. History obtained from anesthesiologist Dr. Linna Caprice and [facility] documentation.  Patient is a 80 yo F with a PMHx of HTN, asthma, breast ca, DM, HLD, CKD stage 3 who was found unresponsive after HD cath placement today. Patient was admitted for acute hypoxic respiratory failure from PNA. She underwent procedure for RIJ cath and LUA AVF by vascular surgery this afternoon. As per anesthesiology, patient received Versed 4 mg, Fentanyl 50 mcg, Neo-synephrine 4770 mcg, Precedex 69 mcg, and Etomidate 20 mg during the procedure. She continued to be hypotensive during the procedure. Amount of blood loss is not reported in EPIC. Patient was receiving IV Lasix 160 mg q6 prior to the procedure.   As per anesthesiologist and nursing staff, when patient initially came into the PACU after the procedure she was arousable and following commands, however, was hypotensive with systolic in the [id number]. She was given Neo-synephrine in the PACU and subsequently became more obtunded and then unresponsive. She was also given Albumin 5% 12.5 g and Calcium 1g. Patient was intubated by anesthesiology to protect her airway. She was given Etomidate 8 mg and Succinylcholine 100 mg during the process. No Precedex was administered. ABG was obtained 30 mins after intubation and showed pH 7.25, PCO2 52.6, and PO2 261. Labs were also obtained at this time and revealed Na 142, K 4.2, H/H 8.2/24. Patient was continued on Neo-synephrine 30 mcg/ min for hypotension.   PAST MEDICAL HISTORY :  Past Medical History  Diagnosis Date  . Hypertension   . Asthma   . Shortness of breath   . Cancer ([place])     right breast  . GERD (gastroesophageal reflux disease)   . Arthritis   . Pneumonia    . Diabetes mellitus   . HLD (hyperlipidemia)   . CKD (chronic kidney disease), stage III    Past Surgical History  Procedure Laterality Date  . Wrist surgery    . Breast surgery    . Abdominal hysterectomy    . Cholecystectomy    . Carpel tunnel    . Insertion of dialysis catheter N/A 03/20/2015    Procedure: INSERTION OF DIALYSIS CATHETER RIGHT INTERNAL JUGULAR VEIN;  Surgeon: Elam Dutch, MD;  Location: [place];  Service: Vascular;  Laterality: N/A;  . Av fistula placement Left 03/19/2015    Procedure: CREATION OF LEFT UPPER ARM  ARTERIOVENOUS (AV) FISTULA ;  Surgeon: Elam Dutch, MD;  Location: [place];  Service: Vascular;  Laterality: Left;   Prior to Admission medications   Medication Sig Start Date End Date Taking? Authorizing Provider  ADVAIR DISKUS 250-50 MCG/DOSE AEPB Inhale 1 puff into the lungs 2 (two) times daily. 11/23/14  Yes Historical Provider, MD  amitriptyline (ELAVIL) 50 MG tablet Take 50 mg by mouth at bedtime.     Yes Historical Provider, MD  benzonatate (TESSALON) 100 MG capsule Take 1 capsule (100 mg total) by mouth 3 (three) times daily as needed for cough. 12/21/14  Yes Maryann Mikhail, DO  BYSTOLIC 10 MG tablet Take 10 mg by mouth daily. 12/07/14  Yes Historical Provider, MD  docusate sodium (COLACE) 100 MG capsule Take 1 capsule (100 mg total) by mouth 2 (two) times daily. Patient taking differently: Take 100 mg by  mouth 2 (two) times daily as needed for moderate constipation.  12/21/14  Yes Maryann Mikhail, DO  feeding supplement, GLUCERNA SHAKE, (GLUCERNA SHAKE) LIQD Take 237 mLs by mouth 3 (three) times daily between meals. 12/21/14  Yes Maryann Mikhail, DO  fluticasone (FLONASE) 50 MCG/ACT nasal spray Place 2 sprays into the nose daily as needed for allergies.    Yes Historical Provider, MD  gabapentin (NEURONTIN) 100 MG capsule Take 100 mg by mouth 3 (three) times daily. 11/23/14  Yes Historical Provider, MD  hydrOXYzine (ATARAX/VISTARIL) 25 MG tablet  Take 25 mg by mouth daily as needed. Itching, anxiety 02/15/15  Yes Historical Provider, MD  ipratropium (ATROVENT) 0.03 % nasal spray Place 1 spray into both nostrils daily as needed. allergies 02/24/15  Yes Historical Provider, MD  loratadine (CLARITIN) 10 MG tablet Take 10 mg by mouth daily.     Yes Historical Provider, MD  losartan-hydrochlorothiazide (HYZAAR) 100-12.5 MG tablet Take 1 tablet by mouth daily. 02/24/15  Yes Historical Provider, MD  magnesium oxide (MAG-OX) 400 (241.3 MG) MG tablet Take 1 tablet by mouth daily. 11/23/14  Yes Historical Provider, MD  megestrol (MEGACE) 400 MG/10ML suspension Take 5 mLs (200 mg total) by mouth daily. 12/21/14  Yes Maryann Mikhail, DO  omeprazole (PRILOSEC) 20 MG capsule Take 40 mg by mouth daily.     Yes Historical Provider, MD  oxyCODONE-acetaminophen (PERCOCET/ROXICET) 5-325 MG tablet Take 1 tablet by mouth every 4 (four) hours as needed. pain 01/23/15  Yes Historical Provider, MD  pioglitazone (ACTOS) 30 MG tablet Take 30 mg by mouth daily. 11/01/14  Yes Historical Provider, MD  potassium chloride (K-DUR) 10 MEQ tablet Take 10 mEq by mouth daily. 12/28/14  Yes Historical Provider, MD  simvastatin (ZOCOR) 20 MG tablet Take 20 mg by mouth daily. 02/21/15  Yes Historical Provider, MD  traMADol (ULTRAM) 50 MG tablet Take 1 tablet (50 mg total) by mouth every 6 (six) hours as needed. Patient taking differently: Take 50 mg by mouth every 6 (six) hours as needed for moderate pain.  09/25/13  Yes Gregor Hams, MD  traZODone (DESYREL) 50 MG tablet Take 50 mg by mouth at bedtime as needed for sleep.  11/23/14  Yes Historical Provider, MD  VENTOLIN HFA 108 (90 BASE) MCG/ACT inhaler Inhale 2 puffs into the lungs every 4 (four) hours as needed for wheezing or shortness of breath.  11/23/14  Yes Historical Provider, MD  levofloxacin (LEVAQUIN) 500 MG tablet Take 500 mg by mouth daily. Reported on 03/29/2015 03/07/15   Historical Provider, MD   Allergies  Allergen  Reactions  . Eggs Or Egg-Derived Products Hives  . Morphine And Related Itching and Other (See Comments)    Crawling sensations  . Vicodin [Hydrocodone-Acetaminophen] Itching and Other (See Comments)    Crawling sensations  . Banana Other (See Comments)    Pt does not eat bananas  . Levaquin [Levofloxacin] Other (See Comments)    Hallucinations: Pt was to take 10 day course, but began to hallucinate after day 8.  . Shellfish Allergy Other (See Comments)    Pt doesn't eat SeaFood at all. No Fish, no Shrimp, etc    FAMILY HISTORY:  Family History  Problem Relation Age of Onset  . Diabetes Mellitus II Daughter   . Cancer Mother   . Dementia Mother    SOCIAL HISTORY:  reports that she has been smoking E-cigarettes.  She has quit using smokeless tobacco. She reports that she does not drink alcohol or use illicit  drugs.  REVIEW OF SYSTEMS:   Denies any abdominal pain, dyspnea,. Chest pain, palpitations.   SUBJECTIVE:  No issues overnight. Still on neo drip.   VITAL SIGNS: Temp:  [97.7 F (36.5 C)-98.9 F (37.2 C)] 98.5 F (36.9 C) (01/20 0915) Pulse Rate:  [78-92] 88 (01/20 1030) Resp:  [0-29] 25 (01/20 1030) BP: (64-127)/(11-79) 109/43 mmHg (01/20 1022) SpO2:  [97 %-100 %] 99 % (01/20 1030) FiO2 (%):  [40 %] 40 % (01/20 1130) Weight:  [194 lb 0.1 oz (88 kg)] 194 lb 0.1 oz (88 kg) (01/20 0230) HEMODYNAMICS: CVP:  [3 mmHg-50 mmHg] 9 mmHg VENTILATOR SETTINGS: Vent Mode:  [-] PRVC FiO2 (%):  [40 %] 40 % Set Rate:  [16 bmp] 16 bmp Vt Set:  [500 mL] 500 mL PEEP:  [5 cmH20] 5 cmH20 Pressure Support:  [8 cmH20-10 cmH20] 8 cmH20 Plateau Pressure:  [19 cmH20-22 cmH20] 22 cmH20 INTAKE / OUTPUT: Intake/Output      01/19 0701 - 01/20 0700 01/20 0701 - 01/21 0700   I.V. (mL/kg) 1806.6 (20.5) 183.3 (2.1)   Blood 409    IV Piggyback 150 100   Total Intake(mL/kg) 2365.6 (26.9) 283.3 (3.2)   Urine (mL/kg/hr)     Other     Total Output       Net +2365.6 +283.3           PHYSICAL EXAMINATION: General:  Awake, no distress. EET tube in place. Neuro: Opens eyes to commands, Moves all 4 extremities HEENT: Oral ETT. Moist mucus membranes Cardiovascular: RRR. S1, S2, No MRG Lungs: Clear, No wheeze or crackles. Abdomen: Soft. +BS. Non tender, non distended Skin:  Intact.  LABS:  CBC  Recent Labs Lab 03/21/15 1610 03/22/15 0415 03/22/15 1637 03/23/15 0445  WBC 17.4* 17.9*  --  13.2*  HGB 7.5* 6.6* 8.3* 9.4*  HCT 21.3* 19.5* 24.3* 27.8*  PLT 160 152  --  143*   Coag's No results for input(s): APTT, INR in the last 168 hours. BMET  Recent Labs Lab 03/21/15 1610 03/22/15 0415 03/23/15 0445  NA 138 140 132*  K 3.2* 3.6 3.3*  CL 101 101 98*  CO2 21* 23 22  BUN 35* 21* 25*  CREATININE 4.21* 3.21* 4.07*  GLUCOSE 95 86 99   Electrolytes  Recent Labs Lab 03/21/15 0446 03/21/15 1610 03/22/15 0415 03/23/15 0445  CALCIUM 7.8* 7.8* 7.6* 7.7*  MG 1.5*  --  1.6* 1.5*  PHOS 4.5 5.2* 4.1 4.3   Sepsis Markers  Recent Labs Lab 03/16/15 2352 03/21/15 1619 03/21/15 1916 03/22/15 1202 03/23/15 0445  LATICACIDVEN 3.1* 1.5 1.5  --   --   PROCALCITON  --   --   --  7.13 7.96   ABG  Recent Labs Lab 03/18/15 0134 03/24/2015 1712 03/21/15 0955  PHART 7.384 7.251* 7.419  PCO2ART 28.6* 52.6* 33.1*  PO2ART 62.6* 261.0* 84.0   Liver Enzymes  Recent Labs Lab 03/21/15 1610 03/22/15 0415 03/23/15 0445  AST 31 31 30   ALT 12* 12* 12*  ALKPHOS 38 40 47  BILITOT 0.7 0.8 0.9  ALBUMIN 1.9* 1.8* 1.8*   Cardiac Enzymes  Recent Labs Lab 03/22/15 2036 03/23/15 0055 03/23/15 0445  TROPONINI 0.08* 0.09* 0.29*   Glucose  Recent Labs Lab 03/22/15 1325 03/22/15 1725 03/22/15 1919 03/23/15 0013 03/23/15 0342 03/23/15 0911  GLUCAP 85 98 108* 103* 99 97    Imaging Ct Abdomen Pelvis Wo Contrast  03/22/2015  CLINICAL DATA:  80 year old female inpatient with a history of  breast cancer, stage 3 chronic kidney disease, admitted for  unresponsiveness, hypotension and acute hypoxic respiratory failure post left upper extremity AV fistula surgery, being treated for pneumonia, with intermittent abdominal pain when extubated 1 day prior. EXAM: CT CHEST, ABDOMEN AND PELVIS WITHOUT CONTRAST TECHNIQUE: Multidetector CT imaging of the chest, abdomen and pelvis was performed following the standard protocol without IV contrast. COMPARISON:  Chest radiograph from one day prior. 11/02/2007 CT abdomen/pelvis. 02/27/2007 chest CT angiogram. FINDINGS: CT CHEST Mediastinum/Nodes: Borderline cardiomegaly. Trace pericardial fluid/thickening. Left circumflex and right coronary atherosclerosis. Right internal jugular central venous catheter terminates in the right atrium. Left internal jugular central venous catheter courses through the left brachiocephalic vein and terminates within an accessory left superior mediastinal vein adjacent to the aortic arch. Great vessels are normal in course and caliber. Normal visualized thyroid. Normal esophagus. Surgical clips are seen in the right axilla. No pathologically enlarged axillary lymph nodes. There is prominent confluent right paratracheal, subcarinal and right hilar lymphadenopathy, new since 02/27/2007. For example the right paratracheal adenopathy measures 1.9 cm in short axis diameter (series 2/image 26). Subcarinal lymphadenopathy measures 2.1 cm in short axis diameter (series 2/image 31). There is a new mildly enlarged 1.0 cm right prevascular mediastinal node (series 2/image 24). Right hilar lymphadenopathy is poorly delineated on this noncontrast study, however appears to envelop the bronchus intermedius. No gross left hilar adenopathy. Lungs/Pleura: No pneumothorax. Small right greater than left bilateral layering pleural effusions. Endotracheal tube tip is 2.5 cm above the carina. There is extensive patchy consolidation and ground-glass opacity throughout the dependent right greater than left upper lobes.  There is dense consolidation of the entire right middle and right lower lobes, without significant volume loss in these lobes, noting occlusion of the entire right middle and right lower bronchial trees by solid-appearing material. There is patchy ground-glass opacity in the peripheral left lower lobe. There is mild-to-moderate centrilobular emphysema. Anterior left lower lobe 1.0 cm pulmonary nodule (series 3/image 30) is stable since 02/27/2007 and benign. Musculoskeletal: No aggressive appearing focal osseous lesions. Moderate degenerative changes in the thoracic spine. CT ABDOMEN AND PELVIS Hepatobiliary: There is a vague low-attenuation 2.2 x 2.1 cm lateral segment left liver lobe mass (series 2/ image 58), which is new since 11/02/2007 CT. Otherwise normal liver. Cholecystectomy. No biliary ductal dilatation. Pancreas: Normal, with no mass or duct dilation. Spleen: Normal size. No mass. Adrenals/Urinary Tract: Mild irregular thickening of the left greater than right adrenal glands without discrete adrenal mass. No hydronephrosis. No renal stones. Simple appearing 3.2 cm renal cyst in the anterior lower left kidney. No additional contour deforming renal lesions. Urinary bladder is collapsed by indwelling Foley catheter. Small amount of gas in the nondependent bladder lumen is probably due to instrumentation. Stomach/Bowel: Enteric tube terminates in the body of the stomach. Grossly normal stomach. Normal caliber small bowel with no small bowel wall thickening. Appendix is not discretely visualized . There is circumferential wall thickening involving the entire cecum and ascending colon. There is a separate region of circumferential colonic wall thickening in the distal transverse colon, splenic flexure of the colon and descending colon. There is mild scattered pericolonic fat stranding at the areas of colonic wall thickening. No pneumatosis . Vascular/Lymphatic: Atherosclerotic nonaneurysmal abdominal aorta. No  pathologically enlarged lymph nodes in the abdomen or pelvis. Reproductive: Status post hysterectomy, with no abnormal findings at the vaginal cuff. No adnexal mass. Other: No pneumoperitoneum. Trace pelvic ascites. No focal fluid collection. Musculoskeletal: No aggressive appearing focal osseous lesions.  Moderate degenerative changes in the lumbar spine. Mild anasarca. IMPRESSION: 1. Prominent mediastinal and right hilar lymphadenopathy, which is confluent in the right paratracheal, subcarinal and right hilar region. 2. Dense consolidation of the entire right middle and right lower lobes, without significant volume loss in these lobes. Filling of the entire bronchial tree by solid-appearing material within the right middle and right lower lobes. Given the smoking history and the presence of prominent confluent right mediastinal and right hilar lymphadenopathy, these findings raise concern for an occlusive infiltrative right middle/lower lobe lung neoplasm, although a severe dense pneumonia could also explain these findings. Consider correlation with bronchoscopy. When clinically feasible, further evaluation with chest CT with IV contrast and/or PET-CT are advised. 3. Extensive patchy consolidation and ground-glass opacity throughout the dependent right greater than left upper lobes. Patchy ground-glass opacity in the peripheral left lower lobe. Findings indicate multifocal pneumonia. 4. New vague low-attenuation 2.2 cm lateral segment left liver lobe mass, cannot exclude a liver metastasis. 5. Left IJ central venous catheter terminates within a diminutive accessory left superior mediastinal vein, consider retracting 2 cm. 6. Circumferential colonic wall thickening separately involving the right colon and descending colon with associated mild pericolonic fat stranding, suggesting a nonspecific infectious or inflammatory colitis, with the differential including C. diff colitis. 7. Small right greater than left  bilateral pleural effusions. Trace pericardial fluid/thickening. Trace pelvic ascites. Mild anasarca. Electronically Signed   By: Ilona Sorrel M.D.   On: 03/22/2015 14:41   Ct Chest Wo Contrast  03/22/2015  CLINICAL DATA:  80 year old female inpatient with a history of breast cancer, stage 3 chronic kidney disease, admitted for unresponsiveness, hypotension and acute hypoxic respiratory failure post left upper extremity AV fistula surgery, being treated for pneumonia, with intermittent abdominal pain when extubated 1 day prior. EXAM: CT CHEST, ABDOMEN AND PELVIS WITHOUT CONTRAST TECHNIQUE: Multidetector CT imaging of the chest, abdomen and pelvis was performed following the standard protocol without IV contrast. COMPARISON:  Chest radiograph from one day prior. 11/02/2007 CT abdomen/pelvis. 02/27/2007 chest CT angiogram. FINDINGS: CT CHEST Mediastinum/Nodes: Borderline cardiomegaly. Trace pericardial fluid/thickening. Left circumflex and right coronary atherosclerosis. Right internal jugular central venous catheter terminates in the right atrium. Left internal jugular central venous catheter courses through the left brachiocephalic vein and terminates within an accessory left superior mediastinal vein adjacent to the aortic arch. Great vessels are normal in course and caliber. Normal visualized thyroid. Normal esophagus. Surgical clips are seen in the right axilla. No pathologically enlarged axillary lymph nodes. There is prominent confluent right paratracheal, subcarinal and right hilar lymphadenopathy, new since 02/27/2007. For example the right paratracheal adenopathy measures 1.9 cm in short axis diameter (series 2/image 26). Subcarinal lymphadenopathy measures 2.1 cm in short axis diameter (series 2/image 31). There is a new mildly enlarged 1.0 cm right prevascular mediastinal node (series 2/image 24). Right hilar lymphadenopathy is poorly delineated on this noncontrast study, however appears to envelop the  bronchus intermedius. No gross left hilar adenopathy. Lungs/Pleura: No pneumothorax. Small right greater than left bilateral layering pleural effusions. Endotracheal tube tip is 2.5 cm above the carina. There is extensive patchy consolidation and ground-glass opacity throughout the dependent right greater than left upper lobes. There is dense consolidation of the entire right middle and right lower lobes, without significant volume loss in these lobes, noting occlusion of the entire right middle and right lower bronchial trees by solid-appearing material. There is patchy ground-glass opacity in the peripheral left lower lobe. There is mild-to-moderate centrilobular emphysema. Anterior left  lower lobe 1.0 cm pulmonary nodule (series 3/image 30) is stable since 02/27/2007 and benign. Musculoskeletal: No aggressive appearing focal osseous lesions. Moderate degenerative changes in the thoracic spine. CT ABDOMEN AND PELVIS Hepatobiliary: There is a vague low-attenuation 2.2 x 2.1 cm lateral segment left liver lobe mass (series 2/ image 58), which is new since 11/02/2007 CT. Otherwise normal liver. Cholecystectomy. No biliary ductal dilatation. Pancreas: Normal, with no mass or duct dilation. Spleen: Normal size. No mass. Adrenals/Urinary Tract: Mild irregular thickening of the left greater than right adrenal glands without discrete adrenal mass. No hydronephrosis. No renal stones. Simple appearing 3.2 cm renal cyst in the anterior lower left kidney. No additional contour deforming renal lesions. Urinary bladder is collapsed by indwelling Foley catheter. Small amount of gas in the nondependent bladder lumen is probably due to instrumentation. Stomach/Bowel: Enteric tube terminates in the body of the stomach. Grossly normal stomach. Normal caliber small bowel with no small bowel wall thickening. Appendix is not discretely visualized . There is circumferential wall thickening involving the entire cecum and ascending colon.  There is a separate region of circumferential colonic wall thickening in the distal transverse colon, splenic flexure of the colon and descending colon. There is mild scattered pericolonic fat stranding at the areas of colonic wall thickening. No pneumatosis . Vascular/Lymphatic: Atherosclerotic nonaneurysmal abdominal aorta. No pathologically enlarged lymph nodes in the abdomen or pelvis. Reproductive: Status post hysterectomy, with no abnormal findings at the vaginal cuff. No adnexal mass. Other: No pneumoperitoneum. Trace pelvic ascites. No focal fluid collection. Musculoskeletal: No aggressive appearing focal osseous lesions. Moderate degenerative changes in the lumbar spine. Mild anasarca. IMPRESSION: 1. Prominent mediastinal and right hilar lymphadenopathy, which is confluent in the right paratracheal, subcarinal and right hilar region. 2. Dense consolidation of the entire right middle and right lower lobes, without significant volume loss in these lobes. Filling of the entire bronchial tree by solid-appearing material within the right middle and right lower lobes. Given the smoking history and the presence of prominent confluent right mediastinal and right hilar lymphadenopathy, these findings raise concern for an occlusive infiltrative right middle/lower lobe lung neoplasm, although a severe dense pneumonia could also explain these findings. Consider correlation with bronchoscopy. When clinically feasible, further evaluation with chest CT with IV contrast and/or PET-CT are advised. 3. Extensive patchy consolidation and ground-glass opacity throughout the dependent right greater than left upper lobes. Patchy ground-glass opacity in the peripheral left lower lobe. Findings indicate multifocal pneumonia. 4. New vague low-attenuation 2.2 cm lateral segment left liver lobe mass, cannot exclude a liver metastasis. 5. Left IJ central venous catheter terminates within a diminutive accessory left superior mediastinal  vein, consider retracting 2 cm. 6. Circumferential colonic wall thickening separately involving the right colon and descending colon with associated mild pericolonic fat stranding, suggesting a nonspecific infectious or inflammatory colitis, with the differential including C. diff colitis. 7. Small right greater than left bilateral pleural effusions. Trace pericardial fluid/thickening. Trace pelvic ascites. Mild anasarca. Electronically Signed   By: Ilona Sorrel M.D.   On: 03/22/2015 14:41   Dg Chest Port 1 View  03/23/2015  CLINICAL DATA:  Hypoxia EXAM: PORTABLE CHEST 1 VIEW COMPARISON:  March 21, 2015 chest radiograph and March 22, 2015 chest CT FINDINGS: Endotracheal tube tip is 3.6 cm above the carina. Nasogastric tube tip and side port below the diaphragm. Right-sided central catheter tip is in the right atrium, unchanged. Left jugular catheter tip is again noted to the left of midline in an accessory  left superior mediastinal vein near the aortic arch. No pneumothorax. Extensive airspace consolidation and edema on the right persists without change. There is mild atelectasis in the left base. The left lung is otherwise clear. The heart is upper normal in size with pulmonary vascularity within normal limits. Adenopathy is better seen on recent CT but is appreciable in the right peritracheal region. No bone lesions. IMPRESSION: Extensive airspace consolidation on the right, stable. Left base atelectasis. No new opacity. No change in cardiac silhouette. Adenopathy present, better seen on CT. Tube and catheter positions unchanged without pneumothorax. Note that the left jugular venous catheter tip is in an accessory left superior mediastinal vein near the aortic arch. This finding is documented on recent CT. Electronically Signed   By: Lowella Grip III M.D.   On: 03/23/2015 08:12   Dg Chest Port 1 View  03/21/2015  CLINICAL DATA:  Endotracheal tube placement EXAM: PORTABLE CHEST 1 VIEW COMPARISON:   03/21/2015 FINDINGS: Cardiomediastinal silhouette is stable. Stable right pleural effusion. Persistent streaky opacification of the right hemithorax suspicious for asymmetric pneumonia or less likely pulmonary edema. Endotracheal tube in place with tip 4.7 cm above the carina. Right IJ dialysis catheter with tip in right atrium is unchanged in position. Again noted left central catheter with tip in the region of aortic arch. Arterial placement cannot be excluded. Mild left basilar atelectasis. IMPRESSION: Stable right pleural effusion. Persistent streaky opacification of the right hemithorax suspicious for asymmetric pneumonia or less likely pulmonary edema. Endotracheal tube in place with tip 4.7 cm above the carina. Right IJ dialysis catheter with tip in right atrium is unchanged in position. Again noted left central catheter with tip in the region of aortic arch. Arterial placement cannot be excluded. Electronically Signed   By: Lahoma Crocker M.D.   On: 03/21/2015 18:07   Dg Abd Portable 1v  03/22/2015  CLINICAL DATA:  Nasogastric tube placement EXAM: PORTABLE ABDOMEN - 1 VIEW COMPARISON:  None. FINDINGS: Nasogastric tube tip and side port are in the stomach. The bowel gas pattern is unremarkable. There is consolidation in the visualized right lung region. IMPRESSION: Nasogastric tube tip and side port in stomach. Bowel gas pattern unremarkable. Electronically Signed   By: Lowella Grip III M.D.   On: 03/22/2015 11:06   Dg Abd Portable 1v  03/21/2015  CLINICAL DATA:  Abdominal pain. EXAM: PORTABLE ABDOMEN - 1 VIEW COMPARISON:  CT of the abdomen and pelvis on 11/02/2007 FINDINGS: Visualized bowel gas shows no overt obstruction. Mild gas in multiple small bowel loops may be consistent with a component of ileus or enteritis. No abnormal calcifications are seen. Visualized bony structures show spondylosis of the lumbar spine. IMPRESSION: Possible component of small bowel ileus/enteritis. No evidence of bowel  obstruction. Electronically Signed   By: Aletta Edouard M.D.   On: 03/21/2015 18:07    ASSESSMENT / PLAN:  PULMONARY A: Acute hypoxic hypercarbic respiratory failure likely from PNA CAP on vanc/ zosyn 1/13, now on Zosyn   Hx of Asthma   P:   - Continue mechanical ventilation. - Will plan on bronch today to clear our RML. RLL - PRN Fentanyl - Continue nebs  - On Zosyn. Add vanco  CARDIOVASCULAR A: Hypotension during AVF and HD cath placement - likely due to Precedex gtt in the setting of pulmonary HTN. Echo showing high PA pressure.   Elevated troponin - EKG normal  Sepsis from PNA Afib P:  - On Amio for afib. Holding heparin for recent lower  GIB. - Monitor hemodynamics  - Lt IJ CVL placement uncertain since it does not cross the midline. However measured CVP is 5.  Venous sat 89% while arterial sat at same time is 97%. I believe the line is venous. Expect higher sats on venous side given the AV fistula.  RENAL A:  AKI superimposed on chronic kidney disease - needs dialysis Metabolic acidosis P:   -HD as per renal  GASTROINTESTINAL A:  Hx of GERD Reccurent abd pain  Pancreatitis ? Ileus,  P:   - Protonix  - Start tube feeds.  HEMATOLOGIC A:  Anemia of chronic disease from ESRD and possible acute blood loss from surgery. Lower GI bleed- ? hemmoroids P:  -Monitor CBC -Transfuse for Hgb <7.  INFECTIOUS A: Sepsis from pneumonia  Leukocytosis  C diff negative. P:   -Monitor CBC -Pending blood cultures  -Continue Zosyn, Add vanco. Follow procalcitonin.   ENDOCRINE A:  HX of DM2 P:   -Monitor CBGs -SSI  NEUROLOGIC A:  Acute encephalopathy 2/2 pneumonia, sepsis, renal failure, respiratory failure  AMS in the setting of hypotension P:   -Continue to monitor  -Continue mechanical ventilation to protect airway.  TODAY'S SUMMARY:   Critical care time- 45 mins. Family updated at bedside.   Marshell Garfinkel MD [facility] Pager [phone] If no answer or after 3pm call: [phone] 03/23/2015, 11:48 AM

## 2015-03-23 NOTE — Progress Notes (Signed)
Hemodialysis- Completed without issue. Kept even per orders. Vitals remained stable throughout. Pt also received iv iron and Vancomycin. Report given to primary RN Christine.

## 2015-03-23 NOTE — Progress Notes (Addendum)
Pharmacy Antibiotic Follow-up Note  Stacey Zamora is a 80 y.o. year-old female admitted on 03/14/2015.  The patient is currently on : day # 8 ZOSYN for PNA RLL and Vanc to restart 1/20 for PNA. WBC down to 13.2, LA down to 1.5, PCT up to 7.96. Last vanc dose 1500mg  x on 1/15 -Noted s/p HD on 1/18  (BFR 200 for 3hrs) and per RN, HD today is starting now, planned BFR 200 for 3h. HD RN to draw pre-HD VR prior to starting.  ANTIBIOTICS: Zosyn 1/13>>  Vanc 1/13 >> 1/17; 1/20>> Outpt LVQ 1/2>>1/9. planned thru 1/12 but stopped d/t hallucinatins after 8 days   Microbiology: 1/13 blood x 2 - neg/ final 1/13 influenza neg 1/14 MRSA screen neg 1/18 Cdiff toxin: neg  Levels: 1/20 pre-HD VR: 13   Plan:  Continue Zosyn 2.25 gm IV q8hr Vanc 1g IV x 1 post-HD today Monitor clinical progress, c/s, renal function, abx plan/LOT Vanc levels as indicated Monitor HD schedule/toleration in new ESRD patient   Temp (24hrs), Avg:98.3 F (36.8 C), Min:97.7 F (36.5 C), Max:98.9 F (37.2 C)   Recent Labs Lab 03/20/15 0330 03/21/15 0446 03/21/15 1610 03/22/15 0415 03/23/15 0445  WBC 17.6* 19.6* 17.4* 17.9* 13.2*     Recent Labs Lab 03/20/15 0330 03/21/15 0446 03/21/15 1610 03/22/15 0415 03/23/15 0445  CREATININE 2.78* 3.70* 4.21* 3.21* 4.07*   Estimated Creatinine Clearance: 11.9 mL/min (by C-G formula based on Cr of 4.07).    Allergies  Allergen Reactions  . Eggs Or Egg-Derived Products Hives  . Morphine And Related Itching and Other (See Comments)    Crawling sensations  . Vicodin [Hydrocodone-Acetaminophen] Itching and Other (See Comments)    Crawling sensations  . Banana Other (See Comments)    Pt does not eat bananas  . Levaquin [Levofloxacin] Other (See Comments)    Hallucinations: Pt was to take 10 day course, but began to hallucinate after day 8.  . Shellfish Allergy Other (See Comments)    Pt doesn't eat SeaFood at all. No Fish, no Shrimp, etc    Elicia Lamp, PharmD,  [facility] Clinical Pharmacist Pager [phone] 03/23/2015 12:31 PM

## 2015-03-23 NOTE — Plan of Care (Signed)
Problem: Phase I Progression Outcomes Goal: Hemodynamically stable Outcome: Progressing Stacey Zamora was infusing at 35 mcg and gradually titrated up to 65 mcg.

## 2015-03-23 NOTE — Progress Notes (Signed)
eLink Physician-Brief Progress Note Patient Name: Stacey Zamora DOB: 03/02/1936 MRN: [id number]   Date of Service  03/23/2015  HPI/Events of Note  Best Practice  eICU Interventions  SCDs ordered for DVT propy as patient's heparin d/ced due to lower GIB     Intervention Category Intermediate Interventions: Best-practice therapies (e.g. DVT, beta blocker, etc.)  Janara Klett 03/23/2015, 12:11 AM

## 2015-03-23 NOTE — Progress Notes (Signed)
Subjective: Interval History: has complaints wants tube out..  Objective: Vital signs in last 24 hours: Temp:  [97.5 F (36.4 C)-99.6 F (37.6 C)] 98.5 F (36.9 C) (01/20 0400) Pulse Rate:  [78-91] 83 (01/20 0700) Resp:  [0-29] 20 (01/20 0700) BP: (64-127)/(11-79) 126/39 mmHg (01/20 0700) SpO2:  [97 %-100 %] 100 % (01/20 0700) FiO2 (%):  [40 %] 40 % (01/20 0400) Weight:  [88 kg (194 lb 0.1 oz)] 88 kg (194 lb 0.1 oz) (01/20 0230) Weight change: -2.7 kg (-5 lb 15.2 oz)  Intake/Output from previous day: 01/19 0701 - 01/20 0700 In: 2365.6 [I.V.:1806.6; Blood:409; IV Piggyback:150] Out: -  Intake/Output this shift:    General appearance: cooperative, slowed mentation and coop. sleepy Resp: rales bibasilar and R>L and rhonchi bibasilar and R>L Chest wall: RIJ cath Cardio: S1, S2 normal and systolic murmur: holosystolic 2/6, blowing at apex GI: pos bs, liver down cm soft Extremities: AVF LUA  Lab Results:  Recent Labs  03/22/15 0415 03/22/15 1637 03/23/15 0445  WBC 17.9*  --  13.2*  HGB 6.6* 8.3* 9.4*  HCT 19.5* 24.3* 27.8*  PLT 152  --  143*   BMET:  Recent Labs  03/22/15 0415 03/23/15 0445  NA 140 132*  K 3.6 3.3*  CL 101 98*  CO2 23 22  GLUCOSE 86 99  BUN 21* 25*  CREATININE 3.21* 4.07*  CALCIUM 7.6* 7.7*   No results for input(s): PTH in the last 72 hours. Iron Studies: No results for input(s): IRON, TIBC, TRANSFERRIN, FERRITIN in the last 72 hours.  Studies/Results: Ct Abdomen Pelvis Wo Contrast  03/22/2015  CLINICAL DATA:  80 year old female inpatient with a history of breast cancer, stage 3 chronic kidney disease, admitted for unresponsiveness, hypotension and acute hypoxic respiratory failure post left upper extremity AV fistula surgery, being treated for pneumonia, with intermittent abdominal pain when extubated 1 day prior. EXAM: CT CHEST, ABDOMEN AND PELVIS WITHOUT CONTRAST TECHNIQUE: Multidetector CT imaging of the chest, abdomen and pelvis was  performed following the standard protocol without IV contrast. COMPARISON:  Chest radiograph from one day prior. 11/02/2007 CT abdomen/pelvis. 02/27/2007 chest CT angiogram. FINDINGS: CT CHEST Mediastinum/Nodes: Borderline cardiomegaly. Trace pericardial fluid/thickening. Left circumflex and right coronary atherosclerosis. Right internal jugular central venous catheter terminates in the right atrium. Left internal jugular central venous catheter courses through the left brachiocephalic vein and terminates within an accessory left superior mediastinal vein adjacent to the aortic arch. Great vessels are normal in course and caliber. Normal visualized thyroid. Normal esophagus. Surgical clips are seen in the right axilla. No pathologically enlarged axillary lymph nodes. There is prominent confluent right paratracheal, subcarinal and right hilar lymphadenopathy, new since 02/27/2007. For example the right paratracheal adenopathy measures 1.9 cm in short axis diameter (series 2/image 26). Subcarinal lymphadenopathy measures 2.1 cm in short axis diameter (series 2/image 31). There is a new mildly enlarged 1.0 cm right prevascular mediastinal node (series 2/image 24). Right hilar lymphadenopathy is poorly delineated on this noncontrast study, however appears to envelop the bronchus intermedius. No gross left hilar adenopathy. Lungs/Pleura: No pneumothorax. Small right greater than left bilateral layering pleural effusions. Endotracheal tube tip is 2.5 cm above the carina. There is extensive patchy consolidation and ground-glass opacity throughout the dependent right greater than left upper lobes. There is dense consolidation of the entire right middle and right lower lobes, without significant volume loss in these lobes, noting occlusion of the entire right middle and right lower bronchial trees by solid-appearing material. There is  patchy ground-glass opacity in the peripheral left lower lobe. There is mild-to-moderate  centrilobular emphysema. Anterior left lower lobe 1.0 cm pulmonary nodule (series 3/image 30) is stable since 02/27/2007 and benign. Musculoskeletal: No aggressive appearing focal osseous lesions. Moderate degenerative changes in the thoracic spine. CT ABDOMEN AND PELVIS Hepatobiliary: There is a vague low-attenuation 2.2 x 2.1 cm lateral segment left liver lobe mass (series 2/ image 58), which is new since 11/02/2007 CT. Otherwise normal liver. Cholecystectomy. No biliary ductal dilatation. Pancreas: Normal, with no mass or duct dilation. Spleen: Normal size. No mass. Adrenals/Urinary Tract: Mild irregular thickening of the left greater than right adrenal glands without discrete adrenal mass. No hydronephrosis. No renal stones. Simple appearing 3.2 cm renal cyst in the anterior lower left kidney. No additional contour deforming renal lesions. Urinary bladder is collapsed by indwelling Foley catheter. Small amount of gas in the nondependent bladder lumen is probably due to instrumentation. Stomach/Bowel: Enteric tube terminates in the body of the stomach. Grossly normal stomach. Normal caliber small bowel with no small bowel wall thickening. Appendix is not discretely visualized . There is circumferential wall thickening involving the entire cecum and ascending colon. There is a separate region of circumferential colonic wall thickening in the distal transverse colon, splenic flexure of the colon and descending colon. There is mild scattered pericolonic fat stranding at the areas of colonic wall thickening. No pneumatosis . Vascular/Lymphatic: Atherosclerotic nonaneurysmal abdominal aorta. No pathologically enlarged lymph nodes in the abdomen or pelvis. Reproductive: Status post hysterectomy, with no abnormal findings at the vaginal cuff. No adnexal mass. Other: No pneumoperitoneum. Trace pelvic ascites. No focal fluid collection. Musculoskeletal: No aggressive appearing focal osseous lesions. Moderate degenerative  changes in the lumbar spine. Mild anasarca. IMPRESSION: 1. Prominent mediastinal and right hilar lymphadenopathy, which is confluent in the right paratracheal, subcarinal and right hilar region. 2. Dense consolidation of the entire right middle and right lower lobes, without significant volume loss in these lobes. Filling of the entire bronchial tree by solid-appearing material within the right middle and right lower lobes. Given the smoking history and the presence of prominent confluent right mediastinal and right hilar lymphadenopathy, these findings raise concern for an occlusive infiltrative right middle/lower lobe lung neoplasm, although a severe dense pneumonia could also explain these findings. Consider correlation with bronchoscopy. When clinically feasible, further evaluation with chest CT with IV contrast and/or PET-CT are advised. 3. Extensive patchy consolidation and ground-glass opacity throughout the dependent right greater than left upper lobes. Patchy ground-glass opacity in the peripheral left lower lobe. Findings indicate multifocal pneumonia. 4. New vague low-attenuation 2.2 cm lateral segment left liver lobe mass, cannot exclude a liver metastasis. 5. Left IJ central venous catheter terminates within a diminutive accessory left superior mediastinal vein, consider retracting 2 cm. 6. Circumferential colonic wall thickening separately involving the right colon and descending colon with associated mild pericolonic fat stranding, suggesting a nonspecific infectious or inflammatory colitis, with the differential including C. diff colitis. 7. Small right greater than left bilateral pleural effusions. Trace pericardial fluid/thickening. Trace pelvic ascites. Mild anasarca. Electronically Signed   By: Ilona Sorrel M.D.   On: 03/22/2015 14:41   Ct Chest Wo Contrast  03/22/2015  CLINICAL DATA:  80 year old female inpatient with a history of breast cancer, stage 3 chronic kidney disease, admitted for  unresponsiveness, hypotension and acute hypoxic respiratory failure post left upper extremity AV fistula surgery, being treated for pneumonia, with intermittent abdominal pain when extubated 1 day prior. EXAM: CT CHEST, ABDOMEN  AND PELVIS WITHOUT CONTRAST TECHNIQUE: Multidetector CT imaging of the chest, abdomen and pelvis was performed following the standard protocol without IV contrast. COMPARISON:  Chest radiograph from one day prior. 11/02/2007 CT abdomen/pelvis. 02/27/2007 chest CT angiogram. FINDINGS: CT CHEST Mediastinum/Nodes: Borderline cardiomegaly. Trace pericardial fluid/thickening. Left circumflex and right coronary atherosclerosis. Right internal jugular central venous catheter terminates in the right atrium. Left internal jugular central venous catheter courses through the left brachiocephalic vein and terminates within an accessory left superior mediastinal vein adjacent to the aortic arch. Great vessels are normal in course and caliber. Normal visualized thyroid. Normal esophagus. Surgical clips are seen in the right axilla. No pathologically enlarged axillary lymph nodes. There is prominent confluent right paratracheal, subcarinal and right hilar lymphadenopathy, new since 02/27/2007. For example the right paratracheal adenopathy measures 1.9 cm in short axis diameter (series 2/image 26). Subcarinal lymphadenopathy measures 2.1 cm in short axis diameter (series 2/image 31). There is a new mildly enlarged 1.0 cm right prevascular mediastinal node (series 2/image 24). Right hilar lymphadenopathy is poorly delineated on this noncontrast study, however appears to envelop the bronchus intermedius. No gross left hilar adenopathy. Lungs/Pleura: No pneumothorax. Small right greater than left bilateral layering pleural effusions. Endotracheal tube tip is 2.5 cm above the carina. There is extensive patchy consolidation and ground-glass opacity throughout the dependent right greater than left upper lobes.  There is dense consolidation of the entire right middle and right lower lobes, without significant volume loss in these lobes, noting occlusion of the entire right middle and right lower bronchial trees by solid-appearing material. There is patchy ground-glass opacity in the peripheral left lower lobe. There is mild-to-moderate centrilobular emphysema. Anterior left lower lobe 1.0 cm pulmonary nodule (series 3/image 30) is stable since 02/27/2007 and benign. Musculoskeletal: No aggressive appearing focal osseous lesions. Moderate degenerative changes in the thoracic spine. CT ABDOMEN AND PELVIS Hepatobiliary: There is a vague low-attenuation 2.2 x 2.1 cm lateral segment left liver lobe mass (series 2/ image 58), which is new since 11/02/2007 CT. Otherwise normal liver. Cholecystectomy. No biliary ductal dilatation. Pancreas: Normal, with no mass or duct dilation. Spleen: Normal size. No mass. Adrenals/Urinary Tract: Mild irregular thickening of the left greater than right adrenal glands without discrete adrenal mass. No hydronephrosis. No renal stones. Simple appearing 3.2 cm renal cyst in the anterior lower left kidney. No additional contour deforming renal lesions. Urinary bladder is collapsed by indwelling Foley catheter. Small amount of gas in the nondependent bladder lumen is probably due to instrumentation. Stomach/Bowel: Enteric tube terminates in the body of the stomach. Grossly normal stomach. Normal caliber small bowel with no small bowel wall thickening. Appendix is not discretely visualized . There is circumferential wall thickening involving the entire cecum and ascending colon. There is a separate region of circumferential colonic wall thickening in the distal transverse colon, splenic flexure of the colon and descending colon. There is mild scattered pericolonic fat stranding at the areas of colonic wall thickening. No pneumatosis . Vascular/Lymphatic: Atherosclerotic nonaneurysmal abdominal aorta. No  pathologically enlarged lymph nodes in the abdomen or pelvis. Reproductive: Status post hysterectomy, with no abnormal findings at the vaginal cuff. No adnexal mass. Other: No pneumoperitoneum. Trace pelvic ascites. No focal fluid collection. Musculoskeletal: No aggressive appearing focal osseous lesions. Moderate degenerative changes in the lumbar spine. Mild anasarca. IMPRESSION: 1. Prominent mediastinal and right hilar lymphadenopathy, which is confluent in the right paratracheal, subcarinal and right hilar region. 2. Dense consolidation of the entire right middle and right lower lobes,  without significant volume loss in these lobes. Filling of the entire bronchial tree by solid-appearing material within the right middle and right lower lobes. Given the smoking history and the presence of prominent confluent right mediastinal and right hilar lymphadenopathy, these findings raise concern for an occlusive infiltrative right middle/lower lobe lung neoplasm, although a severe dense pneumonia could also explain these findings. Consider correlation with bronchoscopy. When clinically feasible, further evaluation with chest CT with IV contrast and/or PET-CT are advised. 3. Extensive patchy consolidation and ground-glass opacity throughout the dependent right greater than left upper lobes. Patchy ground-glass opacity in the peripheral left lower lobe. Findings indicate multifocal pneumonia. 4. New vague low-attenuation 2.2 cm lateral segment left liver lobe mass, cannot exclude a liver metastasis. 5. Left IJ central venous catheter terminates within a diminutive accessory left superior mediastinal vein, consider retracting 2 cm. 6. Circumferential colonic wall thickening separately involving the right colon and descending colon with associated mild pericolonic fat stranding, suggesting a nonspecific infectious or inflammatory colitis, with the differential including C. diff colitis. 7. Small right greater than left  bilateral pleural effusions. Trace pericardial fluid/thickening. Trace pelvic ascites. Mild anasarca. Electronically Signed   By: Ilona Sorrel M.D.   On: 03/22/2015 14:41   Dg Chest Port 1 View  03/21/2015  CLINICAL DATA:  Endotracheal tube placement EXAM: PORTABLE CHEST 1 VIEW COMPARISON:  03/21/2015 FINDINGS: Cardiomediastinal silhouette is stable. Stable right pleural effusion. Persistent streaky opacification of the right hemithorax suspicious for asymmetric pneumonia or less likely pulmonary edema. Endotracheal tube in place with tip 4.7 cm above the carina. Right IJ dialysis catheter with tip in right atrium is unchanged in position. Again noted left central catheter with tip in the region of aortic arch. Arterial placement cannot be excluded. Mild left basilar atelectasis. IMPRESSION: Stable right pleural effusion. Persistent streaky opacification of the right hemithorax suspicious for asymmetric pneumonia or less likely pulmonary edema. Endotracheal tube in place with tip 4.7 cm above the carina. Right IJ dialysis catheter with tip in right atrium is unchanged in position. Again noted left central catheter with tip in the region of aortic arch. Arterial placement cannot be excluded. Electronically Signed   By: Lahoma Crocker M.D.   On: 03/21/2015 18:07   Dg Abd Portable 1v  03/22/2015  CLINICAL DATA:  Nasogastric tube placement EXAM: PORTABLE ABDOMEN - 1 VIEW COMPARISON:  None. FINDINGS: Nasogastric tube tip and side port are in the stomach. The bowel gas pattern is unremarkable. There is consolidation in the visualized right lung region. IMPRESSION: Nasogastric tube tip and side port in stomach. Bowel gas pattern unremarkable. Electronically Signed   By: Lowella Grip III M.D.   On: 03/22/2015 11:06   Dg Abd Portable 1v  03/21/2015  CLINICAL DATA:  Abdominal pain. EXAM: PORTABLE ABDOMEN - 1 VIEW COMPARISON:  CT of the abdomen and pelvis on 11/02/2007 FINDINGS: Visualized bowel gas shows no overt  obstruction. Mild gas in multiple small bowel loops may be consistent with a component of ileus or enteritis. No abnormal calcifications are seen. Visualized bony structures show spondylosis of the lumbar spine. IMPRESSION: Possible component of small bowel ileus/enteritis. No evidence of bowel obstruction. Electronically Signed   By: Aletta Edouard M.D.   On: 03/21/2015 18:07    I have reviewed the patient's current medications.  Assessment/Plan: 1 ESRD for HD,  Vol xs but low bps , keep even 2 Anemia TX yest, cont esa, Fe 3 HPTH needs vit D 4 DM 5 Resp failure  pneu and ?? Other 6 ? Lobar collapse 7 nodes in chest 8 New liver lesion 9 colitis C diff neg thus farm 10 pancreatitis 11 Debill P HD, K , ? W/u of lung, liver , ESA, AB    LOS: 7 days   Karie Skowron L 03/23/2015,7:38 AM

## 2015-03-24 ENCOUNTER — Inpatient Hospital Stay (HOSPITAL_COMMUNITY): Payer: Medicare Other

## 2015-03-24 LAB — GLUCOSE, CAPILLARY
GLUCOSE-CAPILLARY: 88 mg/dL (ref 65–99)
GLUCOSE-CAPILLARY: 74 mg/dL (ref 65–99)
GLUCOSE-CAPILLARY: 122 mg/dL — AB (ref 65–99)
Glucose-Capillary: 107 mg/dL — ABNORMAL HIGH (ref 65–99)

## 2015-03-24 LAB — COMPREHENSIVE METABOLIC PANEL
ALT: 13 U/L — AB (ref 14–54)
AST: 28 U/L (ref 15–41)
Albumin: 1.8 g/dL — ABNORMAL LOW (ref 3.5–5.0)
Alkaline Phosphatase: 42 U/L (ref 38–126)
Anion gap: 17 — ABNORMAL HIGH (ref 5–15)
BUN: 15 mg/dL (ref 6–20)
CALCIUM: 7.9 mg/dL — AB (ref 8.9–10.3)
CHLORIDE: 98 mmol/L — AB (ref 101–111)
CO2: 22 mmol/L (ref 22–32)
CREATININE: 3.11 mg/dL — AB (ref 0.44–1.00)
GFR calc Af Amer: 16 mL/min — ABNORMAL LOW (ref >60)
GFR, EST NON AFRICAN AMERICAN: 13 mL/min — AB (ref >60)
Glucose, Bld: 86 mg/dL (ref 65–99)
Potassium: 3.4 mmol/L — ABNORMAL LOW (ref 3.5–5.1)
Sodium: 137 mmol/L (ref 135–145)
TOTAL PROTEIN: 4.8 g/dL — AB (ref 6.5–8.1)
Total Bilirubin: 0.8 mg/dL (ref 0.3–1.2)

## 2015-03-24 LAB — CBC
HCT: 23.7 % — ABNORMAL LOW (ref 36.0–46.0)
HEMOGLOBIN: 8 g/dL — AB (ref 12.0–15.0)
MCH: 28.5 pg (ref 26.0–34.0)
MCHC: 33.8 g/dL (ref 30.0–36.0)
MCV: 84.3 fL (ref 78.0–100.0)
PLATELETS: 156 10*3/uL (ref 150–400)
RBC: 2.81 MIL/uL — AB (ref 3.87–5.11)
RDW: 15.4 % (ref 11.5–15.5)
WBC: 13.9 10*3/uL — AB (ref 4.0–10.5)

## 2015-03-24 LAB — PROCALCITONIN: Procalcitonin: 8.29 ng/mL

## 2015-03-24 LAB — MAGNESIUM: MAGNESIUM: 1.7 mg/dL (ref 1.7–2.4)

## 2015-03-24 LAB — PHOSPHORUS: Phosphorus: 3.2 mg/dL (ref 2.5–4.6)

## 2015-03-24 MED ORDER — SODIUM CHLORIDE 0.9 % IV SOLN
25.0000 ug/h | INTRAVENOUS | Status: DC
  Administered 2015-03-24 (×2): 50 ug/h via INTRAVENOUS
  Filled 2015-03-24 (×4): qty 50

## 2015-03-24 MED ORDER — FENTANYL CITRATE (PF) 100 MCG/2ML IJ SOLN: 50.0000 ug | Freq: Once | INTRAMUSCULAR | Status: DC

## 2015-03-24 MED ORDER — NEPRO/CARBSTEADY PO LIQD
1000.0000 mL | ORAL | Status: DC
  Administered 2015-03-24: 1000 mL via ORAL
  Filled 2015-03-24 (×3): qty 1000

## 2015-03-24 MED ORDER — MIDAZOLAM HCL 2 MG/2ML IJ SOLN
1.0000 mg | INTRAMUSCULAR | Status: DC | PRN
  Filled 2015-03-24 (×2): qty 2

## 2015-03-24 MED ORDER — VITAL HIGH PROTEIN PO LIQD
1000.0000 mL | ORAL | Status: DC
  Administered 2015-03-24 – 2015-03-26 (×3): 1000 mL
  Administered 2015-03-29: 20:00:00

## 2015-03-24 MED ORDER — FENTANYL BOLUS VIA INFUSION
50.0000 ug | INTRAVENOUS | Status: DC | PRN
  Filled 2015-03-24: qty 50

## 2015-03-24 MED ORDER — IMIPENEM-CILASTATIN 250 MG IV SOLR
250.0000 mg | Freq: Two times a day (BID) | INTRAVENOUS | Status: DC
  Administered 2015-03-24 – 2015-03-29 (×11): 250 mg via INTRAVENOUS
  Filled 2015-03-24 (×15): qty 250

## 2015-03-24 MED ORDER — MIDAZOLAM HCL 2 MG/2ML IJ SOLN
1.0000 mg | INTRAMUSCULAR | Status: DC | PRN
  Administered 2015-03-26 – 2015-03-27 (×4): 1 mg via INTRAVENOUS
  Filled 2015-03-24 (×2): qty 2

## 2015-03-24 MED ORDER — PRO-STAT SUGAR FREE PO LIQD
30.0000 mL | Freq: Two times a day (BID) | ORAL | Status: DC
  Administered 2015-03-24 – 2015-03-27 (×6): 30 mL
  Filled 2015-03-24 (×6): qty 30

## 2015-03-24 MED ORDER — POTASSIUM CHLORIDE 10 MEQ/100ML IV SOLN
10.0000 meq | INTRAVENOUS | Status: AC
  Administered 2015-03-24 (×2): 10 meq via INTRAVENOUS
  Filled 2015-03-24: qty 100

## 2015-03-24 NOTE — Progress Notes (Addendum)
PULMONARY / CRITICAL CARE MEDICINE   Name: Stacey Zamora MRN: [id number] DOB: [birth year]    ADMISSION DATE:  03/05/2015 CONSULTATION DATE:  03/26/2015  REFERRING MD :  Anesthesiology/ surgery  PRIMARY SERVICE: CCM  HISTORY OF PRESENT ILLNESS:  Patient is intubated. History obtained from anesthesiologist Dr. Linna Caprice and [facility] documentation.  Patient is a 80 yo F with a PMHx of HTN, asthma, breast ca, DM, HLD, CKD stage 3 who was found unresponsive after HD cath placement today. Patient was admitted for acute hypoxic respiratory failure from PNA. She underwent procedure for RIJ cath and LUA AVF by vascular surgery this afternoon. As per anesthesiology, patient received Versed 4 mg, Fentanyl 50 mcg, Neo-synephrine 4770 mcg, Precedex 69 mcg, and Etomidate 20 mg during the procedure. She continued to be hypotensive during the procedure. Amount of blood loss is not reported in EPIC. Patient was receiving IV Lasix 160 mg q6 prior to the procedure.   As per anesthesiologist and nursing staff, when patient initially came into the PACU after the procedure she was arousable and following commands, however, was hypotensive with systolic in the [id number]. She was given Neo-synephrine in the PACU and subsequently became more obtunded and then unresponsive. She was also given Albumin 5% 12.5 g and Calcium 1g. Patient was intubated by anesthesiology to protect her airway. She was given Etomidate 8 mg and Succinylcholine 100 mg during the process. No Precedex was administered. ABG was obtained 30 mins after intubation and showed pH 7.25, PCO2 52.6, and PO2 261. Labs were also obtained at this time and revealed Na 142, K 4.2, H/H 8.2/24. Patient was continued on Neo-synephrine 30 mcg/ min for hypotension.   PAST MEDICAL HISTORY :  Past Medical History  Diagnosis Date  . Hypertension   . Asthma   . Shortness of breath   . Cancer ([place])     right breast  . GERD (gastroesophageal reflux disease)   . Arthritis   . Pneumonia    . Diabetes mellitus   . HLD (hyperlipidemia)   . CKD (chronic kidney disease), stage III    Past Surgical History  Procedure Laterality Date  . Wrist surgery    . Breast surgery    . Abdominal hysterectomy    . Cholecystectomy    . Carpel tunnel    . Insertion of dialysis catheter N/A 03/06/2015    Procedure: INSERTION OF DIALYSIS CATHETER RIGHT INTERNAL JUGULAR VEIN;  Surgeon: Elam Dutch, MD;  Location: [place];  Service: Vascular;  Laterality: N/A;  . Av fistula placement Left 03/07/2015    Procedure: CREATION OF LEFT UPPER ARM  ARTERIOVENOUS (AV) FISTULA ;  Surgeon: Elam Dutch, MD;  Location: [place];  Service: Vascular;  Laterality: Left;   Prior to Admission medications   Medication Sig Start Date End Date Taking? Authorizing Provider  ADVAIR DISKUS 250-50 MCG/DOSE AEPB Inhale 1 puff into the lungs 2 (two) times daily. 11/23/14  Yes Historical Provider, MD  amitriptyline (ELAVIL) 50 MG tablet Take 50 mg by mouth at bedtime.     Yes Historical Provider, MD  benzonatate (TESSALON) 100 MG capsule Take 1 capsule (100 mg total) by mouth 3 (three) times daily as needed for cough. 12/21/14  Yes Maryann Mikhail, DO  BYSTOLIC 10 MG tablet Take 10 mg by mouth daily. 12/07/14  Yes Historical Provider, MD  docusate sodium (COLACE) 100 MG capsule Take 1 capsule (100 mg total) by mouth 2 (two) times daily. Patient taking differently: Take 100 mg by  mouth 2 (two) times daily as needed for moderate constipation.  12/21/14  Yes Maryann Mikhail, DO  feeding supplement, GLUCERNA SHAKE, (GLUCERNA SHAKE) LIQD Take 237 mLs by mouth 3 (three) times daily between meals. 12/21/14  Yes Maryann Mikhail, DO  fluticasone (FLONASE) 50 MCG/ACT nasal spray Place 2 sprays into the nose daily as needed for allergies.    Yes Historical Provider, MD  gabapentin (NEURONTIN) 100 MG capsule Take 100 mg by mouth 3 (three) times daily. 11/23/14  Yes Historical Provider, MD  hydrOXYzine (ATARAX/VISTARIL) 25 MG tablet  Take 25 mg by mouth daily as needed. Itching, anxiety 02/15/15  Yes Historical Provider, MD  ipratropium (ATROVENT) 0.03 % nasal spray Place 1 spray into both nostrils daily as needed. allergies 02/24/15  Yes Historical Provider, MD  loratadine (CLARITIN) 10 MG tablet Take 10 mg by mouth daily.     Yes Historical Provider, MD  losartan-hydrochlorothiazide (HYZAAR) 100-12.5 MG tablet Take 1 tablet by mouth daily. 02/24/15  Yes Historical Provider, MD  magnesium oxide (MAG-OX) 400 (241.3 MG) MG tablet Take 1 tablet by mouth daily. 11/23/14  Yes Historical Provider, MD  megestrol (MEGACE) 400 MG/10ML suspension Take 5 mLs (200 mg total) by mouth daily. 12/21/14  Yes Maryann Mikhail, DO  omeprazole (PRILOSEC) 20 MG capsule Take 40 mg by mouth daily.     Yes Historical Provider, MD  oxyCODONE-acetaminophen (PERCOCET/ROXICET) 5-325 MG tablet Take 1 tablet by mouth every 4 (four) hours as needed. pain 01/23/15  Yes Historical Provider, MD  pioglitazone (ACTOS) 30 MG tablet Take 30 mg by mouth daily. 11/01/14  Yes Historical Provider, MD  potassium chloride (K-DUR) 10 MEQ tablet Take 10 mEq by mouth daily. 12/28/14  Yes Historical Provider, MD  simvastatin (ZOCOR) 20 MG tablet Take 20 mg by mouth daily. 02/21/15  Yes Historical Provider, MD  traMADol (ULTRAM) 50 MG tablet Take 1 tablet (50 mg total) by mouth every 6 (six) hours as needed. Patient taking differently: Take 50 mg by mouth every 6 (six) hours as needed for moderate pain.  09/25/13  Yes Gregor Hams, MD  traZODone (DESYREL) 50 MG tablet Take 50 mg by mouth at bedtime as needed for sleep.  11/23/14  Yes Historical Provider, MD  VENTOLIN HFA 108 (90 BASE) MCG/ACT inhaler Inhale 2 puffs into the lungs every 4 (four) hours as needed for wheezing or shortness of breath.  11/23/14  Yes Historical Provider, MD  levofloxacin (LEVAQUIN) 500 MG tablet Take 500 mg by mouth daily. Reported on 03/23/2015 03/07/15   Historical Provider, MD   Allergies  Allergen  Reactions  . Eggs Or Egg-Derived Products Hives  . Morphine And Related Itching and Other (See Comments)    Crawling sensations  . Vicodin [Hydrocodone-Acetaminophen] Itching and Other (See Comments)    Crawling sensations  . Banana Other (See Comments)    Pt does not eat bananas  . Levaquin [Levofloxacin] Other (See Comments)    Hallucinations: Pt was to take 10 day course, but began to hallucinate after day 8.  . Shellfish Allergy Other (See Comments)    Pt doesn't eat SeaFood at all. No Fish, no Shrimp, etc    FAMILY HISTORY:  Family History  Problem Relation Age of Onset  . Diabetes Mellitus II Daughter   . Cancer Mother   . Dementia Mother    SOCIAL HISTORY:  reports that she has been smoking E-cigarettes.  She has quit using smokeless tobacco. She reports that she does not drink alcohol or use illicit  drugs.  REVIEW OF SYSTEMS:   Denies any abdominal pain, dyspnea,. Chest pain, palpitations.   SUBJECTIVE:  No issues overnight. Still on neo drip S/p bronch yesterday.Marland Kitchen   VITAL SIGNS: Temp:  [97.7 F (36.5 C)-99.7 F (37.6 C)] 99.1 F (37.3 C) (01/21 0450) Pulse Rate:  [77-95] 84 (01/21 0700) Resp:  [11-27] 23 (01/21 0700) BP: (86-141)/(26-76) 137/44 mmHg (01/21 0700) SpO2:  [96 %-100 %] 100 % (01/21 0725) FiO2 (%):  [40 %] 40 % (01/21 0725) Weight:  [194 lb 14.2 oz (88.4 kg)-197 lb 8.5 oz (89.6 kg)] 197 lb 8.5 oz (89.6 kg) (01/21 0500) HEMODYNAMICS: CVP:  [9 mmHg-50 mmHg] 12 mmHg VENTILATOR SETTINGS: Vent Mode:  [-] PSV;CPAP FiO2 (%):  [40 %] 40 % Set Rate:  [16 bmp] 16 bmp Vt Set:  [500 mL] 500 mL PEEP:  [5 cmH20] 5 cmH20 Pressure Support:  [5 cmH20-8 cmH20] 5 cmH20 Plateau Pressure:  [18 cmH20-24 cmH20] 18 cmH20 INTAKE / OUTPUT: Intake/Output      01/20 0701 - 01/21 0700 01/21 0701 - 01/22 0700   I.V. (mL/kg) 1426.2 (15.9)    Blood     IV Piggyback 560    Total Intake(mL/kg) 1986.2 (22.2)    Other 0    Total Output 0     Net +1986.2             PHYSICAL EXAMINATION: General:  Awake, no distress. EET tube in place. Neuro: Opens eyes to commands, Moves all 4 extremities HEENT: Oral ETT. Moist mucus membranes Cardiovascular: RRR. S1, S2, No MRG Lungs: Clear, No wheeze or crackles. Abdomen: Soft. +BS. Non tender, non distended Skin:  Intact.  LABS:  CBC  Recent Labs Lab 03/22/15 0415 03/22/15 1637 03/23/15 0445 03/24/15 0411  WBC 17.9*  --  13.2* 13.9*  HGB 6.6* 8.3* 9.4* 8.0*  HCT 19.5* 24.3* 27.8* 23.7*  PLT 152  --  143* 156   Coag's No results for input(s): APTT, INR in the last 168 hours. BMET  Recent Labs Lab 03/22/15 0415 03/23/15 0445 03/24/15 0411  NA 140 132* 137  K 3.6 3.3* 3.4*  CL 101 98* 98*  CO2 23 22 22   BUN 21* 25* 15  CREATININE 3.21* 4.07* 3.11*  GLUCOSE 86 99 86   Electrolytes  Recent Labs Lab 03/22/15 0415 03/23/15 0445 03/24/15 0411  CALCIUM 7.6* 7.7* 7.9*  MG 1.6* 1.5* 1.7  PHOS 4.1 4.3 3.2   Sepsis Markers  Recent Labs Lab 03/21/15 1619 03/21/15 1916 03/22/15 1202 03/23/15 0445 03/24/15 0411  LATICACIDVEN 1.5 1.5  --   --   --   PROCALCITON  --   --  7.13 7.96 8.29   ABG  Recent Labs Lab 03/18/15 0134 03/04/2015 1712 03/21/15 0955  PHART 7.384 7.251* 7.419  PCO2ART 28.6* 52.6* 33.1*  PO2ART 62.6* 261.0* 84.0   Liver Enzymes  Recent Labs Lab 03/22/15 0415 03/23/15 0445 03/24/15 0411  AST 31 30 28   ALT 12* 12* 13*  ALKPHOS 40 47 42  BILITOT 0.8 0.9 0.8  ALBUMIN 1.8* 1.8* 1.8*   Cardiac Enzymes  Recent Labs Lab 03/22/15 2036 03/23/15 0055 03/23/15 0445  TROPONINI 0.08* 0.09* 0.29*   Glucose  Recent Labs Lab 03/23/15 0911 03/23/15 1204 03/23/15 1604 03/23/15 1932 03/23/15 2342 03/24/15 0445  GLUCAP 97 82 83 86 81 88    Imaging   ASSESSMENT / PLAN:  PULMONARY A: Acute hypoxic hypercarbic respiratory failure likely from PNA ? Lung malignancy with RLL, RML obstruction and LNs  Hx of Asthma   P:   - Continue mechanical  ventilation. - Daily weaning trials. - Follow bronch results. - Abx as below.  - Continue nebs   CARDIOVASCULAR A: Hypotension during AVF and HD cath placement - likely due to Precedex gtt in the setting of pulmonary HTN. Echo showing high PA pressure.   Elevated troponin - EKG normal  Sepsis from PNA Afib P:  - On Amio for afib. Holding heparin for recent lower GIB. - Monitor hemodynamics  - Lt IJ CVL in left superior mediastinal vein. Discussed with radiology. OK to leave in and use for now.  RENAL A:  AKI superimposed on chronic kidney disease - needs dialysis Metabolic acidosis P:   -HD as per renal  GASTROINTESTINAL A:  Hx of GERD Reccurent abd pain  Pancreatitis ? Ileus,  P:   - Protonix  - Start tube feeds.  HEMATOLOGIC A:  Anemia of chronic disease from ESRD and possible acute blood loss from surgery. Lower GI bleed- ? hemmoroids P:  -Monitor CBC -Transfuse for Hgb <7.  INFECTIOUS A: Sepsis from pneumonia  Leukocytosis  C diff and stool panel negative.  Zosyn 1/13 >> 1/21 Vanco 1/13 >> 1/15. Restart 1/20. Primaxin 1/21 >>  P:   -Monitor CBC -Pending BAL cultures -Continue vanco. Will change zosyn to primaxin.  -PCT continues to increase.   ENDOCRINE A:  HX of DM2 P:   -Monitor CBGs -SSI  NEUROLOGIC A:  Acute encephalopathy 2/2 pneumonia, sepsis, renal failure, respiratory failure  AMS in the setting of hypotension P:   -Continue to monitor  -Fentanyl gtt for sedation -Versed PRN -Continue mechanical ventilation to protect airway.  TODAY'S SUMMARY:   Critical care time- 45 mins. Family updated at bedside 1/20.   Marshell Garfinkel MD [facility] Pager [phone] If no answer or after 3pm call: [phone] 03/24/2015, 7:51 AM

## 2015-03-24 NOTE — Progress Notes (Signed)
Pharmacy Antibiotic Follow-up Note  Stacey Zamora is a 80 y.o. year-old female admitted on 03/28/2015.  The patient is currently on : day # 9 ZOSYN for PNA RLL and Vanc restart day #2 for PNA. With continued trend up in PCT, pharmacy consulted to switch from Zosyn to Primaxin today. WBC up to 13.9, LA down to 1.5, PCT up to 8.29. Last vanc dose 1000mg  x 1 on 1/20 with planned next HD on Monday 1/23.  ANTIBIOTICS: Zosyn 1/13>> 1/21 Vanc 1/13 >> 1/17; 1/20>> Primaxin 1/21>> Outpt LVQ 1/2>>1/9. planned thru 1/12 but stopped d/t hallucinatins after 8 days   Microbiology: 1/13 blood x 2 - neg/ final 1/13 influenza neg 1/14 MRSA screen neg 1/18 Cdiff toxin: neg 1/19 GI panel: neg  Levels: 1/20 pre-HD VR: 13   Plan:  D/c Zosyn and start Primaxin 250 mg IV q12h Vanc with next HD Monitor clinical progress, c/s, renal function, abx plan/LOT Vanc levels as indicated Monitor HD schedule/tolerance in new ESRD patient   Temp (24hrs), Avg:98.5 F (36.9 C), Min:97.7 F (36.5 C), Max:99.7 F (37.6 C)   Recent Labs Lab 03/21/15 0446 03/21/15 1610 03/22/15 0415 03/23/15 0445 03/24/15 0411  WBC 19.6* 17.4* 17.9* 13.2* 13.9*     Recent Labs Lab 03/21/15 0446 03/21/15 1610 03/22/15 0415 03/23/15 0445 03/24/15 0411  CREATININE 3.70* 4.21* 3.21* 4.07* 3.11*   Estimated Creatinine Clearance: 15.8 mL/min (by C-G formula based on Cr of 3.11).    Allergies  Allergen Reactions  . Eggs Or Egg-Derived Products Hives  . Morphine And Related Itching and Other (See Comments)    Crawling sensations  . Vicodin [Hydrocodone-Acetaminophen] Itching and Other (See Comments)    Crawling sensations  . Banana Other (See Comments)    Pt does not eat bananas  . Levaquin [Levofloxacin] Other (See Comments)    Hallucinations: Pt was to take 10 day course, but began to hallucinate after day 8.  . Shellfish Allergy Other (See Comments)    Pt doesn't eat SeaFood at all. No Fish, no Shrimp, etc     Cleven Jansma K. Velva Harman, PharmD, BCPS, CPP Clinical Pharmacist Pager: [phone] Phone: [phone] 03/24/2015 8:51 AM

## 2015-03-24 NOTE — Progress Notes (Addendum)
Nutrition Consult Follow-up  DOCUMENTATION CODES:   Obesity unspecified  INTERVENTION:   Vital High Protein @20  ml/hr and increase by 10 ml every 4 hours to goal rate of 40 ml/hr   Add Prostat liquid protein 30 ml BID via tube  Total TF regimen to provide 1160 kcals, 114 gm protein, 804 ml free water daily  NUTRITION DIAGNOSIS:   Inadequate oral intake related to inability to eat as evidenced by NPO status  GOAL:   Patient will meet greater than or equal to 90% of their needs  MONITOR:   Vent status, Labs, Weight trends, I & O's  REASON FOR ASSESSMENT:   Ventilator  ASSESSMENT:   80 yo F with a PMHx of HTN, asthma, breast ca, DM, HLD, CKD stage 3 who was found unresponsive after HD cath placement. Patient was admitted for acute hypoxic respiratory failure from PNA. Patient s/p procedure 1/16: ULTRASOUND GUIDED INSERTION OF LEFT AV FISTULA  Patient is currently intubated to protect airway- on ventilator support. AKI- Hemodialysis planned for Monday.  M Ve-10.2 Temp (24hrs), Avg:98.8 F (37.1 C), Min:97.7 F (36.5 C), Max:99.7 F (37.6 C) Labs: Creat. 3.11, potassium 3.4, WBC's trending up [id number] Pt has Metabolic acidosis  Diet Order:  Diet NPO time specified  Skin:  Reviewed, no issues  Last BM:  1/20-watery  (c-diff negative)-question ileus per MD  Height:   Ht Readings from Last 1 Encounters:  03/18/2015 5\' 2"  (1.575 m)    Weight:   Wt Readings from Last 1 Encounters:  03/24/15 197 lb 8.5 oz (89.6 kg)    Ideal Body Weight:  50 kg  BMI:  Body mass index is 36.12 kg/(m^2).  Estimated Nutritional Needs:   Kcal:  [id number]  Protein:  110-120 gm  Fluid:  per MD  EDUCATION NEEDS:   No education needs identified at this time  Colman Cater MS,RD,CSG,LDN Office: [phone] Pager: [phone]

## 2015-03-24 NOTE — Progress Notes (Signed)
Subjective: Interval History: has complaints wants tube out.  Objective: Vital signs in last 24 hours: Temp:  [97.7 F (36.5 C)-99.7 F (37.6 C)] 99.1 F (37.3 C) (01/21 0450) Pulse Rate:  [77-95] 84 (01/21 0700) Resp:  [11-27] 23 (01/21 0700) BP: (86-141)/(26-76) 137/44 mmHg (01/21 0700) SpO2:  [96 %-100 %] 100 % (01/21 0725) FiO2 (%):  [40 %] 40 % (01/21 0725) Weight:  [88.4 kg (194 lb 14.2 oz)-89.6 kg (197 lb 8.5 oz)] 89.6 kg (197 lb 8.5 oz) (01/21 0500) Weight change: 0.4 kg (14.1 oz)  Intake/Output from previous day: 01/20 0701 - 01/21 0700 In: 1986.2 [I.V.:1426.2; IV Piggyback:560] Out: 0  Intake/Output this shift:    General appearance: cooperative, slowed mentation and on vent, but coop, sleepy Resp: diminished breath sounds RLL and rales RLL Chest wall: RIJ PC Cardio: S1, S2 normal and systolic murmur: holosystolic 2/6, blowing at apex GI: pos bs, soft, liver down 5 cm Extremities: AVF LUA B&T  Lab Results:  Recent Labs  03/23/15 0445 03/24/15 0411  WBC 13.2* 13.9*  HGB 9.4* 8.0*  HCT 27.8* 23.7*  PLT 143* 156   BMET:  Recent Labs  03/23/15 0445 03/24/15 0411  NA 132* 137  K 3.3* 3.4*  CL 98* 98*  CO2 22 22  GLUCOSE 99 86  BUN 25* 15  CREATININE 4.07* 3.11*  CALCIUM 7.7* 7.9*   No results for input(s): PTH in the last 72 hours. Iron Studies: No results for input(s): IRON, TIBC, TRANSFERRIN, FERRITIN in the last 72 hours.  Studies/Results: Ct Abdomen Pelvis Wo Contrast  03/22/2015  CLINICAL DATA:  80 year old female inpatient with a history of breast cancer, stage 3 chronic kidney disease, admitted for unresponsiveness, hypotension and acute hypoxic respiratory failure post left upper extremity AV fistula surgery, being treated for pneumonia, with intermittent abdominal pain when extubated 1 day prior. EXAM: CT CHEST, ABDOMEN AND PELVIS WITHOUT CONTRAST TECHNIQUE: Multidetector CT imaging of the chest, abdomen and pelvis was performed following the  standard protocol without IV contrast. COMPARISON:  Chest radiograph from one day prior. 11/02/2007 CT abdomen/pelvis. 02/27/2007 chest CT angiogram. FINDINGS: CT CHEST Mediastinum/Nodes: Borderline cardiomegaly. Trace pericardial fluid/thickening. Left circumflex and right coronary atherosclerosis. Right internal jugular central venous catheter terminates in the right atrium. Left internal jugular central venous catheter courses through the left brachiocephalic vein and terminates within an accessory left superior mediastinal vein adjacent to the aortic arch. Great vessels are normal in course and caliber. Normal visualized thyroid. Normal esophagus. Surgical clips are seen in the right axilla. No pathologically enlarged axillary lymph nodes. There is prominent confluent right paratracheal, subcarinal and right hilar lymphadenopathy, new since 02/27/2007. For example the right paratracheal adenopathy measures 1.9 cm in short axis diameter (series 2/image 26). Subcarinal lymphadenopathy measures 2.1 cm in short axis diameter (series 2/image 31). There is a new mildly enlarged 1.0 cm right prevascular mediastinal node (series 2/image 24). Right hilar lymphadenopathy is poorly delineated on this noncontrast study, however appears to envelop the bronchus intermedius. No gross left hilar adenopathy. Lungs/Pleura: No pneumothorax. Small right greater than left bilateral layering pleural effusions. Endotracheal tube tip is 2.5 cm above the carina. There is extensive patchy consolidation and ground-glass opacity throughout the dependent right greater than left upper lobes. There is dense consolidation of the entire right middle and right lower lobes, without significant volume loss in these lobes, noting occlusion of the entire right middle and right lower bronchial trees by solid-appearing material. There is patchy ground-glass opacity in the  peripheral left lower lobe. There is mild-to-moderate centrilobular emphysema.  Anterior left lower lobe 1.0 cm pulmonary nodule (series 3/image 30) is stable since 02/27/2007 and benign. Musculoskeletal: No aggressive appearing focal osseous lesions. Moderate degenerative changes in the thoracic spine. CT ABDOMEN AND PELVIS Hepatobiliary: There is a vague low-attenuation 2.2 x 2.1 cm lateral segment left liver lobe mass (series 2/ image 58), which is new since 11/02/2007 CT. Otherwise normal liver. Cholecystectomy. No biliary ductal dilatation. Pancreas: Normal, with no mass or duct dilation. Spleen: Normal size. No mass. Adrenals/Urinary Tract: Mild irregular thickening of the left greater than right adrenal glands without discrete adrenal mass. No hydronephrosis. No renal stones. Simple appearing 3.2 cm renal cyst in the anterior lower left kidney. No additional contour deforming renal lesions. Urinary bladder is collapsed by indwelling Foley catheter. Small amount of gas in the nondependent bladder lumen is probably due to instrumentation. Stomach/Bowel: Enteric tube terminates in the body of the stomach. Grossly normal stomach. Normal caliber small bowel with no small bowel wall thickening. Appendix is not discretely visualized . There is circumferential wall thickening involving the entire cecum and ascending colon. There is a separate region of circumferential colonic wall thickening in the distal transverse colon, splenic flexure of the colon and descending colon. There is mild scattered pericolonic fat stranding at the areas of colonic wall thickening. No pneumatosis . Vascular/Lymphatic: Atherosclerotic nonaneurysmal abdominal aorta. No pathologically enlarged lymph nodes in the abdomen or pelvis. Reproductive: Status post hysterectomy, with no abnormal findings at the vaginal cuff. No adnexal mass. Other: No pneumoperitoneum. Trace pelvic ascites. No focal fluid collection. Musculoskeletal: No aggressive appearing focal osseous lesions. Moderate degenerative changes in the lumbar  spine. Mild anasarca. IMPRESSION: 1. Prominent mediastinal and right hilar lymphadenopathy, which is confluent in the right paratracheal, subcarinal and right hilar region. 2. Dense consolidation of the entire right middle and right lower lobes, without significant volume loss in these lobes. Filling of the entire bronchial tree by solid-appearing material within the right middle and right lower lobes. Given the smoking history and the presence of prominent confluent right mediastinal and right hilar lymphadenopathy, these findings raise concern for an occlusive infiltrative right middle/lower lobe lung neoplasm, although a severe dense pneumonia could also explain these findings. Consider correlation with bronchoscopy. When clinically feasible, further evaluation with chest CT with IV contrast and/or PET-CT are advised. 3. Extensive patchy consolidation and ground-glass opacity throughout the dependent right greater than left upper lobes. Patchy ground-glass opacity in the peripheral left lower lobe. Findings indicate multifocal pneumonia. 4. New vague low-attenuation 2.2 cm lateral segment left liver lobe mass, cannot exclude a liver metastasis. 5. Left IJ central venous catheter terminates within a diminutive accessory left superior mediastinal vein, consider retracting 2 cm. 6. Circumferential colonic wall thickening separately involving the right colon and descending colon with associated mild pericolonic fat stranding, suggesting a nonspecific infectious or inflammatory colitis, with the differential including C. diff colitis. 7. Small right greater than left bilateral pleural effusions. Trace pericardial fluid/thickening. Trace pelvic ascites. Mild anasarca. Electronically Signed   By: Ilona Sorrel M.D.   On: 03/22/2015 14:41   Ct Chest Wo Contrast  03/22/2015  CLINICAL DATA:  80 year old female inpatient with a history of breast cancer, stage 3 chronic kidney disease, admitted for unresponsiveness,  hypotension and acute hypoxic respiratory failure post left upper extremity AV fistula surgery, being treated for pneumonia, with intermittent abdominal pain when extubated 1 day prior. EXAM: CT CHEST, ABDOMEN AND PELVIS WITHOUT CONTRAST TECHNIQUE:  Multidetector CT imaging of the chest, abdomen and pelvis was performed following the standard protocol without IV contrast. COMPARISON:  Chest radiograph from one day prior. 11/02/2007 CT abdomen/pelvis. 02/27/2007 chest CT angiogram. FINDINGS: CT CHEST Mediastinum/Nodes: Borderline cardiomegaly. Trace pericardial fluid/thickening. Left circumflex and right coronary atherosclerosis. Right internal jugular central venous catheter terminates in the right atrium. Left internal jugular central venous catheter courses through the left brachiocephalic vein and terminates within an accessory left superior mediastinal vein adjacent to the aortic arch. Great vessels are normal in course and caliber. Normal visualized thyroid. Normal esophagus. Surgical clips are seen in the right axilla. No pathologically enlarged axillary lymph nodes. There is prominent confluent right paratracheal, subcarinal and right hilar lymphadenopathy, new since 02/27/2007. For example the right paratracheal adenopathy measures 1.9 cm in short axis diameter (series 2/image 26). Subcarinal lymphadenopathy measures 2.1 cm in short axis diameter (series 2/image 31). There is a new mildly enlarged 1.0 cm right prevascular mediastinal node (series 2/image 24). Right hilar lymphadenopathy is poorly delineated on this noncontrast study, however appears to envelop the bronchus intermedius. No gross left hilar adenopathy. Lungs/Pleura: No pneumothorax. Small right greater than left bilateral layering pleural effusions. Endotracheal tube tip is 2.5 cm above the carina. There is extensive patchy consolidation and ground-glass opacity throughout the dependent right greater than left upper lobes. There is dense  consolidation of the entire right middle and right lower lobes, without significant volume loss in these lobes, noting occlusion of the entire right middle and right lower bronchial trees by solid-appearing material. There is patchy ground-glass opacity in the peripheral left lower lobe. There is mild-to-moderate centrilobular emphysema. Anterior left lower lobe 1.0 cm pulmonary nodule (series 3/image 30) is stable since 02/27/2007 and benign. Musculoskeletal: No aggressive appearing focal osseous lesions. Moderate degenerative changes in the thoracic spine. CT ABDOMEN AND PELVIS Hepatobiliary: There is a vague low-attenuation 2.2 x 2.1 cm lateral segment left liver lobe mass (series 2/ image 58), which is new since 11/02/2007 CT. Otherwise normal liver. Cholecystectomy. No biliary ductal dilatation. Pancreas: Normal, with no mass or duct dilation. Spleen: Normal size. No mass. Adrenals/Urinary Tract: Mild irregular thickening of the left greater than right adrenal glands without discrete adrenal mass. No hydronephrosis. No renal stones. Simple appearing 3.2 cm renal cyst in the anterior lower left kidney. No additional contour deforming renal lesions. Urinary bladder is collapsed by indwelling Foley catheter. Small amount of gas in the nondependent bladder lumen is probably due to instrumentation. Stomach/Bowel: Enteric tube terminates in the body of the stomach. Grossly normal stomach. Normal caliber small bowel with no small bowel wall thickening. Appendix is not discretely visualized . There is circumferential wall thickening involving the entire cecum and ascending colon. There is a separate region of circumferential colonic wall thickening in the distal transverse colon, splenic flexure of the colon and descending colon. There is mild scattered pericolonic fat stranding at the areas of colonic wall thickening. No pneumatosis . Vascular/Lymphatic: Atherosclerotic nonaneurysmal abdominal aorta. No pathologically  enlarged lymph nodes in the abdomen or pelvis. Reproductive: Status post hysterectomy, with no abnormal findings at the vaginal cuff. No adnexal mass. Other: No pneumoperitoneum. Trace pelvic ascites. No focal fluid collection. Musculoskeletal: No aggressive appearing focal osseous lesions. Moderate degenerative changes in the lumbar spine. Mild anasarca. IMPRESSION: 1. Prominent mediastinal and right hilar lymphadenopathy, which is confluent in the right paratracheal, subcarinal and right hilar region. 2. Dense consolidation of the entire right middle and right lower lobes, without significant volume loss in  these lobes. Filling of the entire bronchial tree by solid-appearing material within the right middle and right lower lobes. Given the smoking history and the presence of prominent confluent right mediastinal and right hilar lymphadenopathy, these findings raise concern for an occlusive infiltrative right middle/lower lobe lung neoplasm, although a severe dense pneumonia could also explain these findings. Consider correlation with bronchoscopy. When clinically feasible, further evaluation with chest CT with IV contrast and/or PET-CT are advised. 3. Extensive patchy consolidation and ground-glass opacity throughout the dependent right greater than left upper lobes. Patchy ground-glass opacity in the peripheral left lower lobe. Findings indicate multifocal pneumonia. 4. New vague low-attenuation 2.2 cm lateral segment left liver lobe mass, cannot exclude a liver metastasis. 5. Left IJ central venous catheter terminates within a diminutive accessory left superior mediastinal vein, consider retracting 2 cm. 6. Circumferential colonic wall thickening separately involving the right colon and descending colon with associated mild pericolonic fat stranding, suggesting a nonspecific infectious or inflammatory colitis, with the differential including C. diff colitis. 7. Small right greater than left bilateral pleural  effusions. Trace pericardial fluid/thickening. Trace pelvic ascites. Mild anasarca. Electronically Signed   By: Ilona Sorrel M.D.   On: 03/22/2015 14:41   Dg Chest Port 1 View  03/23/2015  CLINICAL DATA:  Hypoxia EXAM: PORTABLE CHEST 1 VIEW COMPARISON:  March 21, 2015 chest radiograph and March 22, 2015 chest CT FINDINGS: Endotracheal tube tip is 3.6 cm above the carina. Nasogastric tube tip and side port below the diaphragm. Right-sided central catheter tip is in the right atrium, unchanged. Left jugular catheter tip is again noted to the left of midline in an accessory left superior mediastinal vein near the aortic arch. No pneumothorax. Extensive airspace consolidation and edema on the right persists without change. There is mild atelectasis in the left base. The left lung is otherwise clear. The heart is upper normal in size with pulmonary vascularity within normal limits. Adenopathy is better seen on recent CT but is appreciable in the right peritracheal region. No bone lesions. IMPRESSION: Extensive airspace consolidation on the right, stable. Left base atelectasis. No new opacity. No change in cardiac silhouette. Adenopathy present, better seen on CT. Tube and catheter positions unchanged without pneumothorax. Note that the left jugular venous catheter tip is in an accessory left superior mediastinal vein near the aortic arch. This finding is documented on recent CT. Electronically Signed   By: Lowella Grip III M.D.   On: 03/23/2015 08:12   Dg Abd Portable 1v  03/22/2015  CLINICAL DATA:  Nasogastric tube placement EXAM: PORTABLE ABDOMEN - 1 VIEW COMPARISON:  None. FINDINGS: Nasogastric tube tip and side port are in the stomach. The bowel gas pattern is unremarkable. There is consolidation in the visualized right lung region. IMPRESSION: Nasogastric tube tip and side port in stomach. Bowel gas pattern unremarkable. Electronically Signed   By: Lowella Grip III M.D.   On: 03/22/2015 11:06     I have reviewed the patient's current medications.  Assessment/Plan: 1 ESRD  Hd yest, tol well, some vol xs but not removed with bp marginal 2 BP improved, need to wean NE 3 Anemia stable 4 DM controlled 5 Abdm more activity 6 pneu bronch yest, on AB  ? Obstructive 7 mediastinal nodes, liver lesion need eval 8 Resp failure pneu and ?other 9 Hx Breast CA P HD on Mon, AB, vent, wean NE, ? eval nodes and liver lesion    LOS: 8 days   Stacey Zamora 03/24/2015,7:32 AM

## 2015-03-25 ENCOUNTER — Inpatient Hospital Stay (HOSPITAL_COMMUNITY): Payer: Medicare Other

## 2015-03-25 LAB — GLUCOSE, CAPILLARY
GLUCOSE-CAPILLARY: 99 mg/dL (ref 65–99)
GLUCOSE-CAPILLARY: 114 mg/dL — AB (ref 65–99)
Glucose-Capillary: 125 mg/dL — ABNORMAL HIGH (ref 65–99)
Glucose-Capillary: 106 mg/dL — ABNORMAL HIGH (ref 65–99)
Glucose-Capillary: 103 mg/dL — ABNORMAL HIGH (ref 65–99)
Glucose-Capillary: 96 mg/dL (ref 65–99)

## 2015-03-25 LAB — CBC
HEMATOCRIT: 24.2 % — AB (ref 36.0–46.0)
HEMOGLOBIN: 8.1 g/dL — AB (ref 12.0–15.0)
MCH: 29.2 pg (ref 26.0–34.0)
MCHC: 33.5 g/dL (ref 30.0–36.0)
MCV: 87.4 fL (ref 78.0–100.0)
Platelets: 165 10*3/uL (ref 150–400)
RBC: 2.77 MIL/uL — AB (ref 3.87–5.11)
RDW: 16.2 % — ABNORMAL HIGH (ref 11.5–15.5)
WBC: 14.3 10*3/uL — ABNORMAL HIGH (ref 4.0–10.5)

## 2015-03-25 LAB — PHOSPHORUS: Phosphorus: 3.4 mg/dL (ref 2.5–4.6)

## 2015-03-25 LAB — BASIC METABOLIC PANEL
Anion gap: 14 (ref 5–15)
BUN: 24 mg/dL — AB (ref 6–20)
CHLORIDE: 100 mmol/L — AB (ref 101–111)
CO2: 23 mmol/L (ref 22–32)
CREATININE: 3.72 mg/dL — AB (ref 0.44–1.00)
Calcium: 8.2 mg/dL — ABNORMAL LOW (ref 8.9–10.3)
GFR calc Af Amer: 13 mL/min — ABNORMAL LOW (ref >60)
GFR calc non Af Amer: 11 mL/min — ABNORMAL LOW (ref >60)
Glucose, Bld: 99 mg/dL (ref 65–99)
POTASSIUM: 3.2 mmol/L — AB (ref 3.5–5.1)
Sodium: 137 mmol/L (ref 135–145)

## 2015-03-25 LAB — PROCALCITONIN: PROCALCITONIN: 8.17 ng/mL

## 2015-03-25 LAB — MAGNESIUM: MAGNESIUM: 1.6 mg/dL — AB (ref 1.7–2.4)

## 2015-03-25 MED ORDER — MAGNESIUM SULFATE 2 GM/50ML IV SOLN
2.0000 g | Freq: Once | INTRAVENOUS | Status: AC
  Administered 2015-03-25: 2 g via INTRAVENOUS
  Filled 2015-03-25: qty 50

## 2015-03-25 MED ORDER — POTASSIUM CHLORIDE 20 MEQ/15ML (10%) PO SOLN
40.0000 meq | Freq: Once | ORAL | Status: AC
  Administered 2015-03-25: 40 meq via ORAL
  Filled 2015-03-25: qty 30

## 2015-03-25 NOTE — Progress Notes (Signed)
Subjective: Interval History: has no complaint, comfortable, but wants ETT out.  Objective: Vital signs in last 24 hours: Temp:  [98.6 F (37 C)-99.6 F (37.6 C)] 99.1 F (37.3 C) (01/21 2328) Pulse Rate:  [70-92] 76 (01/22 0700) Resp:  [0-26] 5 (01/22 0700) BP: (96-130)/(42-88) 109/52 mmHg (01/22 0700) SpO2:  [95 %-100 %] 99 % (01/22 0724) FiO2 (%):  [40 %] 40 % (01/22 0724) Weight:  [89.7 kg (197 lb 12 oz)] 89.7 kg (197 lb 12 oz) (01/22 0400) Weight change: 1.3 kg (2 lb 13.9 oz)  Intake/Output from previous day: 01/21 0701 - 01/22 0700 In: 2531.7 [I.V.:1491.7; NG/GT:740; IV Piggyback:300] Out: -  Intake/Output this shift:    General appearance: cooperative, mildly obese and on vent Resp: diminished breath sounds RLL, rales bibasilar and rhonchi bibasilar Chest wall: RIJ PC Cardio: S1, S2 normal and systolic murmur: holosystolic 2/6, blowing at apex GI: pos bs, soft, liver down 4 cm Extremities: AVF LUA  Lab Results:  Recent Labs  03/24/15 0411 03/25/15 0504  WBC 13.9* 14.3*  HGB 8.0* 8.1*  HCT 23.7* 24.2*  PLT 156 165   BMET:  Recent Labs  03/24/15 0411 03/25/15 0504  NA 137 137  K 3.4* 3.2*  CL 98* 100*  CO2 22 23  GLUCOSE 86 99  BUN 15 24*  CREATININE 3.11* 3.72*  CALCIUM 7.9* 8.2*   No results for input(s): PTH in the last 72 hours. Iron Studies: No results for input(s): IRON, TIBC, TRANSFERRIN, FERRITIN in the last 72 hours.  Studies/Results: Dg Chest Port 1 View  03/24/2015  CLINICAL DATA:  Respiratory failure. EXAM: PORTABLE CHEST 1 VIEW COMPARISON:  Chest x-ray on 03/23/2015 and chest CT on 03/22/2015 FINDINGS: Endotracheal tube remains with the tip approximately 4 cm above the carina. Dialysis catheter positioning is stable. A left jugular central line extends into the left brachiocephalic vein. Nasogastric tube extends into the stomach. Interval diminished aeration of the right lung with severe airspace disease again noted in the right lung with  associated small right pleural effusion. The left lung remains clear. No pneumothorax. IMPRESSION: Decreased aeration of the right lung with extensive airspace disease again noted throughout the right lung. While this remains likely secondary to pneumonia, bronchial obstruction on the CT study raises the possibility of underlying neoplasm. There remains an associated small right pleural effusion. Electronically Signed   By: Aletta Edouard M.D.   On: 03/24/2015 08:22    I have reviewed the patient's current medications.  Assessment/Plan: 1 CKD 4?AKI now ESRD , for HD in am. Full tx. 2 Anemia esa/Fe 3 HPTH vit D When indid 4 Pneu on AB vent ?? Some obstructive component 5 New lung nodes, liver lesion per primary 6 Resp failure weaning trials.   7 DM controlled 8 Nutrition on TF P HD, TF,AB, vent , weaning, awaiting bronch  Results.   LOS: 9 days   Loie Jahr L 03/25/2015,8:07 AM

## 2015-03-25 NOTE — Progress Notes (Addendum)
PULMONARY / CRITICAL CARE MEDICINE   Name: Stacey Zamora MRN: [id number] DOB: [birth year]    ADMISSION DATE:  03/14/2015 CONSULTATION DATE:  03/26/2015  REFERRING MD :  Anesthesiology/ surgery  PRIMARY SERVICE: CCM  HISTORY OF PRESENT ILLNESS:  Patient is intubated. History obtained from anesthesiologist Dr. Linna Caprice and [facility] documentation.  Patient is a 80 yo F with a PMHx of HTN, asthma, breast ca, DM, HLD, CKD stage 3 who was found unresponsive after HD cath placement today. Patient was admitted for acute hypoxic respiratory failure from PNA. She underwent procedure for RIJ cath and LUA AVF by vascular surgery this afternoon. As per anesthesiology, patient received Versed 4 mg, Fentanyl 50 mcg, Neo-synephrine 4770 mcg, Precedex 69 mcg, and Etomidate 20 mg during the procedure. She continued to be hypotensive during the procedure. Amount of blood loss is not reported in EPIC. Patient was receiving IV Lasix 160 mg q6 prior to the procedure.   As per anesthesiologist and nursing staff, when patient initially came into the PACU after the procedure she was arousable and following commands, however, was hypotensive with systolic in the Q000111Q. She was given Neo-synephrine in the PACU and subsequently became more obtunded and then unresponsive. She was also given Albumin 5% 12.5 g and Calcium 1g. Patient was intubated by anesthesiology to protect her airway. She was given Etomidate 8 mg and Succinylcholine 100 mg during the process. No Precedex was administered. ABG was obtained 30 mins after intubation and showed pH 7.25, PCO2 52.6, and PO2 261. Labs were also obtained at this time and revealed Na 142, K 4.2, H/H 8.2/24. Patient was continued on Neo-synephrine 30 mcg/ min for hypotension.   PAST MEDICAL HISTORY :  Past Medical History  Diagnosis Date  . Hypertension   . Asthma   . Shortness of breath   . Cancer ([place])     right breast  . GERD (gastroesophageal reflux disease)   . Arthritis   . Pneumonia    . Diabetes mellitus   . HLD (hyperlipidemia)   . CKD (chronic kidney disease), stage III    Past Surgical History  Procedure Laterality Date  . Wrist surgery    . Breast surgery    . Abdominal hysterectomy    . Cholecystectomy    . Carpel tunnel    . Insertion of dialysis catheter N/A 03/08/2015    Procedure: INSERTION OF DIALYSIS CATHETER RIGHT INTERNAL JUGULAR VEIN;  Surgeon: Elam Dutch, MD;  Location: [facility];  Service: Vascular;  Laterality: N/A;  . Av fistula placement Left 03/12/2015    Procedure: CREATION OF LEFT UPPER ARM  ARTERIOVENOUS (AV) FISTULA ;  Surgeon: Elam Dutch, MD;  Location: [facility];  Service: Vascular;  Laterality: Left;   Prior to Admission medications   Medication Sig Start Date End Date Taking? Authorizing Provider  ADVAIR DISKUS 250-50 MCG/DOSE AEPB Inhale 1 puff into the lungs 2 (two) times daily. 11/23/14  Yes Historical Provider, MD  amitriptyline (ELAVIL) 50 MG tablet Take 50 mg by mouth at bedtime.     Yes Historical Provider, MD  benzonatate (TESSALON) 100 MG capsule Take 1 capsule (100 mg total) by mouth 3 (three) times daily as needed for cough. 12/21/14  Yes Maryann Mikhail, DO  BYSTOLIC 10 MG tablet Take 10 mg by mouth daily. 12/07/14  Yes Historical Provider, MD  docusate sodium (COLACE) 100 MG capsule Take 1 capsule (100 mg total) by mouth 2 (two) times daily. Patient taking differently: Take 100 mg by  mouth 2 (two) times daily as needed for moderate constipation.  12/21/14  Yes Maryann Mikhail, DO  feeding supplement, GLUCERNA SHAKE, (GLUCERNA SHAKE) LIQD Take 237 mLs by mouth 3 (three) times daily between meals. 12/21/14  Yes Maryann Mikhail, DO  fluticasone (FLONASE) 50 MCG/ACT nasal spray Place 2 sprays into the nose daily as needed for allergies.    Yes Historical Provider, MD  gabapentin (NEURONTIN) 100 MG capsule Take 100 mg by mouth 3 (three) times daily. 11/23/14  Yes Historical Provider, MD  hydrOXYzine (ATARAX/VISTARIL) 25 MG tablet  Take 25 mg by mouth daily as needed. Itching, anxiety 02/15/15  Yes Historical Provider, MD  ipratropium (ATROVENT) 0.03 % nasal spray Place 1 spray into both nostrils daily as needed. allergies 02/24/15  Yes Historical Provider, MD  loratadine (CLARITIN) 10 MG tablet Take 10 mg by mouth daily.     Yes Historical Provider, MD  losartan-hydrochlorothiazide (HYZAAR) 100-12.5 MG tablet Take 1 tablet by mouth daily. 02/24/15  Yes Historical Provider, MD  magnesium oxide (MAG-OX) 400 (241.3 MG) MG tablet Take 1 tablet by mouth daily. 11/23/14  Yes Historical Provider, MD  megestrol (MEGACE) 400 MG/10ML suspension Take 5 mLs (200 mg total) by mouth daily. 12/21/14  Yes Maryann Mikhail, DO  omeprazole (PRILOSEC) 20 MG capsule Take 40 mg by mouth daily.     Yes Historical Provider, MD  oxyCODONE-acetaminophen (PERCOCET/ROXICET) 5-325 MG tablet Take 1 tablet by mouth every 4 (four) hours as needed. pain 01/23/15  Yes Historical Provider, MD  pioglitazone (ACTOS) 30 MG tablet Take 30 mg by mouth daily. 11/01/14  Yes Historical Provider, MD  potassium chloride (K-DUR) 10 MEQ tablet Take 10 mEq by mouth daily. 12/28/14  Yes Historical Provider, MD  simvastatin (ZOCOR) 20 MG tablet Take 20 mg by mouth daily. 02/21/15  Yes Historical Provider, MD  traMADol (ULTRAM) 50 MG tablet Take 1 tablet (50 mg total) by mouth every 6 (six) hours as needed. Patient taking differently: Take 50 mg by mouth every 6 (six) hours as needed for moderate pain.  09/25/13  Yes Gregor Hams, MD  traZODone (DESYREL) 50 MG tablet Take 50 mg by mouth at bedtime as needed for sleep.  11/23/14  Yes Historical Provider, MD  VENTOLIN HFA 108 (90 BASE) MCG/ACT inhaler Inhale 2 puffs into the lungs every 4 (four) hours as needed for wheezing or shortness of breath.  11/23/14  Yes Historical Provider, MD  levofloxacin (LEVAQUIN) 500 MG tablet Take 500 mg by mouth daily. Reported on 03/14/2015 03/07/15   Historical Provider, MD   Allergies  Allergen  Reactions  . Eggs Or Egg-Derived Products Hives  . Morphine And Related Itching and Other (See Comments)    Crawling sensations  . Vicodin [Hydrocodone-Acetaminophen] Itching and Other (See Comments)    Crawling sensations  . Banana Other (See Comments)    Pt does not eat bananas  . Levaquin [Levofloxacin] Other (See Comments)    Hallucinations: Pt was to take 10 day course, but began to hallucinate after day 8.  . Shellfish Allergy Other (See Comments)    Pt doesn't eat SeaFood at all. No Fish, no Shrimp, etc    FAMILY HISTORY:  Family History  Problem Relation Age of Onset  . Diabetes Mellitus II Daughter   . Cancer Mother   . Dementia Mother    SOCIAL HISTORY:  reports that she has been smoking E-cigarettes.  She has quit using smokeless tobacco. She reports that she does not drink alcohol or use illicit  drugs.  REVIEW OF SYSTEMS:   Denies any abdominal pain, dyspnea,. Chest pain, palpitations.   SUBJECTIVE:  No issues overnight. Still on neo.  VITAL SIGNS: Temp:  [98.6 F (37 C)-99.5 F (37.5 C)] 99.1 F (37.3 C) (01/21 2328) Pulse Rate:  [70-87] 87 (01/22 0800) Resp:  [0-26] 17 (01/22 0800) BP: (96-130)/(42-88) 117/49 mmHg (01/22 0800) SpO2:  [97 %-100 %] 97 % (01/22 0800) FiO2 (%):  [40 %] 40 % (01/22 0800) Weight:  [197 lb 12 oz (89.7 kg)] 197 lb 12 oz (89.7 kg) (01/22 0400) HEMODYNAMICS: CVP:  [11 mmHg-14 mmHg] 13 mmHg VENTILATOR SETTINGS: Vent Mode:  [-] PSV;CPAP FiO2 (%):  [40 %] 40 % Set Rate:  [16 bmp] 16 bmp Vt Set:  [500 mL] 500 mL PEEP:  [5 cmH20] 5 cmH20 Pressure Support:  [5 cmH20] 5 cmH20 Plateau Pressure:  [21 cmH20-26 cmH20] 24 cmH20 INTAKE / OUTPUT: Intake/Output      01/21 0701 - 01/22 0700 01/22 0701 - 01/23 0700   I.V. (mL/kg) 1491.7 (16.6) 61.7 (0.7)   NG/GT 740 40   IV Piggyback 300    Total Intake(mL/kg) 2531.7 (28.2) 101.7 (1.1)   Other     Total Output       Net +2531.7 +101.7        Urine Occurrence 0 x      PHYSICAL  EXAMINATION: General:  Awake, no distress. EET tube in place. Neuro: Opens eyes to commands, Moves all 4 extremities HEENT: Oral ETT. Moist mucus membranes Cardiovascular: RRR. S1, S2, No MRG Lungs: Clear, No wheeze or crackles. Abdomen: Soft. +BS. Non tender, non distended Skin:  Intact.  LABS:  CBC  Recent Labs Lab 03/23/15 0445 03/24/15 0411 03/25/15 0504  WBC 13.2* 13.9* 14.3*  HGB 9.4* 8.0* 8.1*  HCT 27.8* 23.7* 24.2*  PLT 143* 156 165   Coag's No results for input(s): APTT, INR in the last 168 hours. BMET  Recent Labs Lab 03/23/15 0445 03/24/15 0411 03/25/15 0504  NA 132* 137 137  K 3.3* 3.4* 3.2*  CL 98* 98* 100*  CO2 22 22 23   BUN 25* 15 24*  CREATININE 4.07* 3.11* 3.72*  GLUCOSE 99 86 99   Electrolytes  Recent Labs Lab 03/23/15 0445 03/24/15 0411 03/25/15 0504  CALCIUM 7.7* 7.9* 8.2*  MG 1.5* 1.7 1.6*  PHOS 4.3 3.2 3.4   Sepsis Markers  Recent Labs Lab 03/21/15 1619 03/21/15 1916 03/22/15 1202 03/23/15 0445 03/24/15 0411  LATICACIDVEN 1.5 1.5  --   --   --   PROCALCITON  --   --  7.13 7.96 8.29   ABG  Recent Labs Lab 03/22/2015 1712 03/21/15 0955  PHART 7.251* 7.419  PCO2ART 52.6* 33.1*  PO2ART 261.0* 84.0   Liver Enzymes  Recent Labs Lab 03/22/15 0415 03/23/15 0445 03/24/15 0411  AST 31 30 28   ALT 12* 12* 13*  ALKPHOS 40 47 42  BILITOT 0.8 0.9 0.8  ALBUMIN 1.8* 1.8* 1.8*   Cardiac Enzymes  Recent Labs Lab 03/22/15 2036 03/23/15 0055 03/23/15 0445  TROPONINI 0.08* 0.09* 0.29*   Glucose  Recent Labs Lab 03/24/15 0445 03/24/15 0949 03/24/15 1743 03/24/15 2059 03/24/15 2324 03/25/15 0507  GLUCAP 88 74 107* 122* 125* 99    Events: 1/13 Admit with RLL PNA, HCAP. 1/16 Transfer to [facility] for R IJ HD cath placement as well as an AV fistula. Intubated in PACU for encephalopathy 1/18 Extubated and then reintubated. 1/29 Bronch  ASSESSMENT / PLAN:  PULMONARY A: Acute  hypoxic hypercarbic respiratory failure  likely from PNA ? Lung malignancy with RLL, RML obstruction and LNs Hx of Asthma   P:   - Continue mechanical ventilation. - Daily weaning trials. - Follow bronch results. - Abx as below.  - Continue nebs   CARDIOVASCULAR A: Hypotension during AVF and HD cath placement - likely due to Precedex gtt in the setting of pulmonary HTN. Echo showing high PA pressure.   Elevated troponin - EKG normal  Sepsis from PNA Transient Afib P:  - Will stop amio as she has been in sinus rythm - Monitor hemodynamics  - Lt IJ CVL in left superior mediastinal vein. Discussed with radiology. OK to leave in and use for now.  RENAL A:  AKI superimposed on chronic kidney disease - needs dialysis Metabolic acidosis P:   -HD as per renal  GASTROINTESTINAL A:  Hx of GERD Reccurent abd pain  Pancreatitis ? Ileus,  P:   - Protonix  - On tube feeds.  HEMATOLOGIC A:  Anemia of chronic disease from ESRD and possible acute blood loss from surgery. Lower GI bleed- ? hemmoroids P:  -Monitor CBC -Transfuse for Hgb <7.  INFECTIOUS A: Sepsis from pneumonia  Leukocytosis  C diff and stool panel negative.  Zosyn 1/13 >> 1/21 Vanco 1/13 >> 1/15. Restart 1/20. Primaxin 1/21 >>  P:   -Monitor CBC -Pending BAL cultures -Continue vanco and primaxin -PCT continues to increase.   ENDOCRINE A:  HX of DM2 P:   -Monitor CBGs -SSI  NEUROLOGIC A:  Acute encephalopathy 2/2 pneumonia, sepsis, renal failure, respiratory failure  AMS in the setting of hypotension P:   -Continue to monitor  -Fentanyl gtt for sedation -Versed PRN  SCDs. Holding heprarin SQ as she had hemorrhoidal bleed recently  TODAY'S SUMMARY: Admitted due to right lower lobe pneumonia, presumed HCAP. CT and  bronch findings concerning for right mass with compression of bronchus intermedius, postobstructive pneumonia. Patient has history of breast cancer with right mastectomy, lymph node dissection.   I had discussions with the  family over the past 2 days regarding these findings. I expressed my concern that she may not tolerate another extubation since her chest x-ray is unchanged. They will discuss with the extended family and patient today.   Family updated at bedside 1/22.   Critical care time- 45 mins.   Marshell Garfinkel MD [place] [facility] Pager [phone] If no answer or after 3pm call: [phone] 03/25/2015, 9:06 AM

## 2015-03-26 ENCOUNTER — Inpatient Hospital Stay (HOSPITAL_COMMUNITY): Payer: Medicare Other

## 2015-03-26 LAB — GLUCOSE, CAPILLARY
GLUCOSE-CAPILLARY: 80 mg/dL (ref 65–99)
GLUCOSE-CAPILLARY: 114 mg/dL — AB (ref 65–99)
Glucose-Capillary: 109 mg/dL — ABNORMAL HIGH (ref 65–99)
Glucose-Capillary: 118 mg/dL — ABNORMAL HIGH (ref 65–99)
Glucose-Capillary: 116 mg/dL — ABNORMAL HIGH (ref 65–99)
Glucose-Capillary: 116 mg/dL — ABNORMAL HIGH (ref 65–99)
Glucose-Capillary: 132 mg/dL — ABNORMAL HIGH (ref 65–99)

## 2015-03-26 LAB — BASIC METABOLIC PANEL
ANION GAP: 11 (ref 5–15)
BUN: 37 mg/dL — ABNORMAL HIGH (ref 6–20)
CO2: 22 mmol/L (ref 22–32)
Calcium: 8.4 mg/dL — ABNORMAL LOW (ref 8.9–10.3)
Chloride: 103 mmol/L (ref 101–111)
Creatinine, Ser: 4.27 mg/dL — ABNORMAL HIGH (ref 0.44–1.00)
GFR, EST AFRICAN AMERICAN: 11 mL/min — AB (ref >60)
GFR, EST NON AFRICAN AMERICAN: 9 mL/min — AB (ref >60)
Glucose, Bld: 135 mg/dL — ABNORMAL HIGH (ref 65–99)
POTASSIUM: 3.7 mmol/L (ref 3.5–5.1)
SODIUM: 136 mmol/L (ref 135–145)

## 2015-03-26 LAB — CULTURE, BAL-QUANTITATIVE W GRAM STAIN
Culture: NO GROWTH
Special Requests: NORMAL

## 2015-03-26 LAB — CBC
HCT: 25.3 % — ABNORMAL LOW (ref 36.0–46.0)
HEMOGLOBIN: 8.2 g/dL — AB (ref 12.0–15.0)
MCH: 28.9 pg (ref 26.0–34.0)
MCHC: 32.4 g/dL (ref 30.0–36.0)
MCV: 89.1 fL (ref 78.0–100.0)
PLATELETS: 161 10*3/uL (ref 150–400)
RBC: 2.84 MIL/uL — AB (ref 3.87–5.11)
RDW: 16.8 % — ABNORMAL HIGH (ref 11.5–15.5)
WBC: 13.5 10*3/uL — AB (ref 4.0–10.5)

## 2015-03-26 LAB — PHOSPHORUS: PHOSPHORUS: 4.2 mg/dL (ref 2.5–4.6)

## 2015-03-26 LAB — CULTURE, BAL-QUANTITATIVE: COLONY COUNT: NO GROWTH

## 2015-03-26 LAB — PROCALCITONIN: PROCALCITONIN: 8.31 ng/mL

## 2015-03-26 LAB — MAGNESIUM: MAGNESIUM: 2.2 mg/dL (ref 1.7–2.4)

## 2015-03-26 MED ORDER — HEPARIN SODIUM (PORCINE) 1000 UNIT/ML DIALYSIS: 1000.0000 [IU] | INTRAMUSCULAR | Status: DC | PRN

## 2015-03-26 MED ORDER — HEPARIN SODIUM (PORCINE) 1000 UNIT/ML DIALYSIS: 40.0000 [IU]/kg | Freq: Once | INTRAMUSCULAR | Status: DC

## 2015-03-26 MED ORDER — DARBEPOETIN ALFA 200 MCG/0.4ML IJ SOSY
PREFILLED_SYRINGE | INTRAMUSCULAR | Status: AC
  Administered 2015-03-26: 200 ug via INTRAVENOUS
  Filled 2015-03-26: qty 0.4

## 2015-03-26 MED ORDER — LIDOCAINE-PRILOCAINE 2.5-2.5 % EX CREA: 1.0000 "application " | TOPICAL_CREAM | CUTANEOUS | Status: DC | PRN

## 2015-03-26 MED ORDER — PENTAFLUOROPROP-TETRAFLUOROETH EX AERO: 1.0000 "application " | INHALATION_SPRAY | CUTANEOUS | Status: DC | PRN

## 2015-03-26 MED ORDER — SODIUM CHLORIDE 0.9 % IV SOLN: 100.0000 mL | INTRAVENOUS | Status: DC | PRN

## 2015-03-26 MED ORDER — PANTOPRAZOLE SODIUM 40 MG PO PACK
40.0000 mg | PACK | Freq: Every day | ORAL | Status: DC
  Administered 2015-03-26 – 2015-03-27 (×2): 40 mg
  Filled 2015-03-26 (×2): qty 20

## 2015-03-26 MED ORDER — LIDOCAINE HCL (PF) 1 % IJ SOLN: 5.0000 mL | INTRAMUSCULAR | Status: DC | PRN

## 2015-03-26 MED ORDER — ALTEPLASE 2 MG IJ SOLR: 2.0000 mg | Freq: Once | INTRAMUSCULAR | Status: DC | PRN

## 2015-03-26 MED ORDER — VANCOMYCIN HCL IN DEXTROSE 1-5 GM/200ML-% IV SOLN
1000.0000 mg | Freq: Once | INTRAVENOUS | Status: AC
  Administered 2015-03-26: 1000 mg via INTRAVENOUS
  Filled 2015-03-26: qty 200

## 2015-03-26 NOTE — Care Management Important Message (Signed)
Important Message  Patient Details  Name: Stacey Zamora MRN: [id number] Date of Birth: [birth year]   Medicare Important Message Given:  Yes    Jazmin Ley P Aariana Shankland 03/26/2015, 2:04 PM

## 2015-03-26 NOTE — Progress Notes (Signed)
PULMONARY / CRITICAL CARE MEDICINE   Name: Stacey Zamora MRN: [id number] DOB: 04-30-1935    ADMISSION DATE:  03/12/2015 CONSULTATION DATE:  03/07/2015  REFERRING MD :  Anesthesiology/ surgery  PRIMARY SERVICE: CCM  HISTORY OF PRESENT ILLNESS:  Patient is intubated. History obtained from anesthesiologist Dr. Linna Caprice and [facility] documentation.  Patient is a 80 yo F with a PMHx of HTN, asthma, breast ca, DM, HLD, CKD stage 3 who was found unresponsive after HD cath placement 1/16. Patient was admitted for acute hypoxic respiratory failure from PNA. She underwent procedure for RIJ cath and LUA AVF by vascular surgery pm 1/16. As per anesthesiology, patient received Versed 4 mg, Fentanyl 50 mcg, Neo-synephrine 4770 mcg, Precedex 69 mcg, and Etomidate 20 mg during the procedure. She continued to be hypotensive during the procedure. Amount of blood loss is not reported in EPIC. Patient was receiving IV Lasix 160 mg q6 prior to the procedure.   As per anesthesiologist and nursing staff, when patient initially came into the PACU after the procedure she was arousable and following commands, however, was hypotensive with systolic in the Q000111Q. She was given Neo-synephrine in the PACU and subsequently became more obtunded and then unresponsive. She was also given Albumin 5% 12.5 g and Calcium 1g. Patient was intubated by anesthesiology to protect her airway. She was given Etomidate 8 mg and Succinylcholine 100 mg during the process. No Precedex was administered. ABG was obtained 30 mins after intubation and showed pH 7.25, PCO2 52.6, and PO2 261. Labs were also obtained at this time and revealed Na 142, K 4.2, H/H 8.2/24. Patient was continued on Neo-synephrine 30 mcg/ min for hypotension.   SUBJECTIVE / Interval events:  Tolerating PSV 5 am 1/23 Awake and following commands Preparing for HD now Phenylephrine off since 0100   VITAL SIGNS: Temp:  [98.1 F (36.7 C)-99 F (37.2 C)] 98.1 F (36.7 C) (01/23  0726) Pulse Rate:  [83-98] 90 (01/23 0700) Resp:  [0-24] 17 (01/23 0700) BP: (102-129)/(45-57) 102/53 mmHg (01/23 0700) SpO2:  [97 %-99 %] 99 % (01/23 0700) FiO2 (%):  [40 %] 40 % (01/23 0600) Weight:  [90.3 kg (199 lb 1.2 oz)] 90.3 kg (199 lb 1.2 oz) (01/23 0600) HEMODYNAMICS: CVP:  [12 mmHg-14 mmHg] 12 mmHg VENTILATOR SETTINGS: Vent Mode:  [-] PRVC FiO2 (%):  [40 %] 40 % Set Rate:  [16 bmp] 16 bmp Vt Set:  [500 mL] 500 mL PEEP:  [5 cmH20] 5 cmH20 Pressure Support:  [5 cmH20] 5 cmH20 Plateau Pressure:  [16 cmH20-22 cmH20] 16 cmH20 INTAKE / OUTPUT: Intake/Output      01/22 0701 - 01/23 0700 01/23 0701 - 01/24 0700   I.V. (mL/kg) 828.9 (9.2)    NG/GT 960    IV Piggyback 200    Total Intake(mL/kg) 1988.9 (22)    Net +1988.9          Stool Occurrence 1 x      PHYSICAL EXAMINATION: General:  Awake, no distress. EET tube in place. Neuro: Opens eyes to commands, Moves all 4 extremities HEENT: Oral ETT. Moist mucus membranes Cardiovascular: RRR. S1, S2, No MRG Lungs: Clear, No wheeze or crackles. Abdomen: Soft. +BS. Non tender, non distended Skin:  Intact.  LABS:  CBC  Recent Labs Lab 03/24/15 0411 03/25/15 0504 03/26/15 0359  WBC 13.9* 14.3* 13.5*  HGB 8.0* 8.1* 8.2*  HCT 23.7* 24.2* 25.3*  PLT 156 165 161   Coag's No results for input(s): APTT, INR in the last  168 hours. BMET  Recent Labs Lab 03/24/15 0411 03/25/15 0504 03/26/15 0359  NA 137 137 136  K 3.4* 3.2* 3.7  CL 98* 100* 103  CO2 22 23 22   BUN 15 24* 37*  CREATININE 3.11* 3.72* 4.27*  GLUCOSE 86 99 135*   Electrolytes  Recent Labs Lab 03/24/15 0411 03/25/15 0504 03/26/15 0359  CALCIUM 7.9* 8.2* 8.4*  MG 1.7 1.6* 2.2  PHOS 3.2 3.4 4.2   Sepsis Markers  Recent Labs Lab 03/21/15 1619 03/21/15 1916  03/24/15 0411 03/25/15 1140 03/26/15 0359  LATICACIDVEN 1.5 1.5  --   --   --   --   PROCALCITON  --   --   < > 8.29 8.17 8.31  < > = values in this interval not  displayed. ABG  Recent Labs Lab 03/09/2015 1712 03/21/15 0955  PHART 7.251* 7.419  PCO2ART 52.6* 33.1*  PO2ART 261.0* 84.0   Liver Enzymes  Recent Labs Lab 03/22/15 0415 03/23/15 0445 03/24/15 0411  AST 31 30 28   ALT 12* 12* 13*  ALKPHOS 40 47 42  BILITOT 0.8 0.9 0.8  ALBUMIN 1.8* 1.8* 1.8*   Cardiac Enzymes  Recent Labs Lab 03/22/15 2036 03/23/15 0055 03/23/15 0445  TROPONINI 0.08* 0.09* 0.29*   Glucose  Recent Labs Lab 03/25/15 0829 03/25/15 1215 03/25/15 1549 03/25/15 1927 03/26/15 0004 03/26/15 0345  GLUCAP 106* 114* 103* 96 109* 118*    Events: 1/13 Admit with RLL PNA, HCAP. 1/16 Transfer to [facility] for R IJ HD cath placement as well as an AV fistula. Intubated in PACU for encephalopathy 1/18 Extubated and then reintubated. 1/20 Bronch with BAL  ASSESSMENT / PLAN:  PULMONARY A: Acute hypoxic hypercarbic respiratory failure likely from PNA + sedating meds ? Lung malignancy with RLL, RML obstruction and LAD Hx of Asthma   PNA, ? Aspiration vs obstructive  P:   - Continue mechanical ventilation. - Daily weaning trials, performing well 1/23. Would like for her to be a bit more awake, to finish HD before an extubation attempt - Follow bronch results, cytology pending - Abx as below.  - Continue nebs   CARDIOVASCULAR A: Hypotension during AVF and HD cath placement - likely due to sedating meds in the setting of pulmonary HTN +/- sepsis. Echo shows high PA pressure.   Elevated troponin - EKG normal  Transient Afib P:  - off amiodarone and phenylephrine - Monitor hemodynamics  - Lt IJ CVL in left superior mediastinal vein. Discussed with radiology. OK to leave in and use for now.  RENAL A:  AKI superimposed on chronic kidney disease, starting HD Metabolic acidosis P:   -HD as per renal -goal volume removal if BP will tolerate -follow BMP  GASTROINTESTINAL A:  Hx of GERD Recurrent abd pain  Pancreatitis ? Ileus P:   - Protonix  - On tube  feeds.  HEMATOLOGIC A:  Anemia of chronic disease from ESRD and possible acute blood loss from surgery. Lower GI bleed- ? hemmoroids P:  -Monitor CBC -Transfuse for Hgb <7.  INFECTIOUS A: Sepsis from pneumonia  Leukocytosis  C diff and stool panel negative.  Zosyn 1/13 >> 1/21 Vanco 1/13 >> 1/15. Restart 1/20 >>  Primaxin 1/21 >>  P:   -Monitor CBC -Pending BAL cultures -Continue vanco and primaxin -PCT continues to increase.   ENDOCRINE A:  HX of DM2 P:   -Monitor CBGs -SSI  NEUROLOGIC A:  Acute encephalopathy 2/2 pneumonia, sepsis, renal failure, respiratory failure  AMS in the  setting of hypotension P:   -Continue to monitor  -Fentanyl gtt for sedation -Versed PRN  SCDs. Holding heprarin SQ as she had hemorrhoidal bleed recently  TODAY'S SUMMARY: Admitted due to right lower lobe pneumonia, presumed HCAP. CT and bronch findings concerning for right mass with compression of bronchus intermedius, postobstructive pneumonia. Patient has history of breast cancer with right mastectomy, lymph node dissection.   Dr Vaughan Browner had discussions with the family 1/21-22 regarding these findings. Concerned that she may not tolerate another extubation since her chest x-ray is unchanged. They will discuss with the extended family and patient  Daughters and granddaughter updated at bedside 1/23.   Independent Critical care time- 40 mins.   Baltazar Apo, MD, PhD 03/26/2015, 8:38 AM [place] [phone] or if no answer [phone]

## 2015-03-26 NOTE — Progress Notes (Signed)
HD   Rising venous pressures, indicative of clot in venous chamber.  Blood returned, clot noted.  Machine relined and tx resumed.  Will continue to monitor.

## 2015-03-26 NOTE — Progress Notes (Signed)
Patient ID: Stacey Zamora, female   DOB: [birth year], 80 y.o.   MRN: [id number] S:s/p bronch on 03/23/15 and awaiting results O:BP 102/53 mmHg  Pulse 90  Temp(Src) 98.1 F (36.7 C) (Oral)  Resp 17  Ht 5\' 2"  (1.575 m)  Wt 90.3 kg (199 lb 1.2 oz)  BMI 36.40 kg/m2  SpO2 99%  Intake/Output Summary (Last 24 hours) at 03/26/15 0834 Last data filed at 03/26/15 0700  Gross per 24 hour  Intake 1887.2 ml  Output      0 ml  Net 1887.2 ml   Intake/Output: I/O last 3 completed shifts: In: 3309.3 [I.V.:1569.3; NG/GT:1440; IV Piggyback:300] Out: -   Intake/Output this shift:    Weight change: 0.6 kg (1 lb 5.2 oz) Gen:WD WN AAF intubated but responsive in NAD CVS:no rub Resp:decreased BS at right base Abd: benign Ext:LUE AVF +T/B,  Dialysis Access: RIJ [facility]   Recent Labs Lab 03/20/15 0330 03/21/15 0446 03/21/15 1610 03/22/15 0415 03/23/15 0445 03/24/15 0411 03/25/15 0504 03/26/15 0359  NA 141 140 138 140 132* 137 137 136  K 3.3* 3.5 3.2* 3.6 3.3* 3.4* 3.2* 3.7  CL 102 101 101 101 98* 98* 100* 103  CO2 25 22 21* 23 22 22 23 22   GLUCOSE 107* 105* 95 86 99 86 99 135*  BUN 25* 33* 35* 21* 25* 15 24* 37*  CREATININE 2.78* 3.70* 4.21* 3.21* 4.07* 3.11* 3.72* 4.27*  ALBUMIN 2.1*  --  1.9* 1.8* 1.8* 1.8*  --   --   CALCIUM 7.8* 7.8* 7.8* 7.6* 7.7* 7.9* 8.2* 8.4*  PHOS 3.1 4.5 5.2* 4.1 4.3 3.2 3.4 4.2  AST 27  --  31 31 30 28   --   --   ALT 11*  --  12* 12* 12* 13*  --   --    Liver Function Tests:  Recent Labs Lab 03/22/15 0415 03/23/15 0445 03/24/15 0411  AST 31 30 28   ALT 12* 12* 13*  ALKPHOS 40 47 42  BILITOT 0.8 0.9 0.8  PROT 4.6* 4.8* 4.8*  ALBUMIN 1.8* 1.8* 1.8*    Recent Labs Lab 03/21/15 1610  LIPASE 58*  AMYLASE 109*   No results for input(s): AMMONIA in the last 168 hours. CBC:  Recent Labs Lab 03/22/15 0415  03/23/15 0445 03/24/15 0411 03/25/15 0504 03/26/15 0359  WBC 17.9*  --  13.2* 13.9* 14.3* 13.5*  HGB 6.6*  < > 9.4* 8.0* 8.1* 8.2*  HCT 19.5*   < > 27.8* 23.7* 24.2* 25.3*  MCV 84.8  --  83.5 84.3 87.4 89.1  PLT 152  --  143* 156 165 161  < > = values in this interval not displayed. Cardiac Enzymes:  Recent Labs Lab 03/20/15 0030  03/21/15 2310 03/22/15 0415 03/22/15 2036 03/23/15 0055 03/23/15 0445  CKTOTAL 52  --   --   --   --   --   --   CKMB 8.5*  --   --   --   --   --   --   TROPONINI  --   < > 0.21* 0.28* 0.08* 0.09* 0.29*  < > = values in this interval not displayed. CBG:  Recent Labs Lab 03/25/15 1215 03/25/15 1549 03/25/15 1927 03/26/15 0004 03/26/15 0345  GLUCAP 114* 103* 96 109* 118*    Iron Studies: No results for input(s): IRON, TIBC, TRANSFERRIN, FERRITIN in the last 72 hours. Studies/Results: Dg Chest Port 1 View  03/26/2015  CLINICAL DATA:  Respiratory failure, pneumonia,  sepsis, pulmonary edema, acute and chronic renal failure, current smoker. EXAM: PORTABLE CHEST 1 VIEW COMPARISON:  Portable chest x-ray of March 25, 2015. FINDINGS: The left lung is well-expanded. The interstitial markings are mildly increased on the left but slightly less conspicuous today. On the right there is volume loss amounting to approximately 1/2 of the hemi thorax. There are near confluent interstitial infiltrates throughout the aerated portion of the left upper lobe. There is a small right pleural effusion. The cardiac silhouette is normal in size. The pulmonary vascularity is less prominent centrally today. The endotracheal tube tip lies 3.5 cm above the carina. The esophagogastric tube tip projects below the inferior margin of the image. The left internal jugular venous catheter tip lies in the junction of the left internal jugular vein with the left subclavian vein. The dual-lumen dialysis catheter tip projects at the cavoatrial junction. IMPRESSION: Slight interval improvement in the appearance of the pulmonary interstitium bilaterally. Considerable volume loss on the right consistent with pneumonia and pleural effusion  remains. Electronically Signed   By: David  Martinique M.D.   On: 03/26/2015 07:19   Dg Chest Port 1 View  03/25/2015  CLINICAL DATA:  Acute respiratory failure EXAM: PORTABLE CHEST 1 VIEW COMPARISON:  03/24/2015 FINDINGS: Cardiomediastinal silhouette is stable. Stable endotracheal tube and NG tube position. Left central line is unchanged in position. Right IJ central line with tip in right atrium is stable. Again noted extensive airspace opacification of the right lung. Stable right pleural effusion with right lower lobe consolidation. No convincing pulmonary edema. IMPRESSION: Stable support apparatus. Again noted extensive airspace opacification of the right lung. Stable right pleural effusion with right lower lobe consolidation. No convincing pulmonary edema. Electronically Signed   By: Lahoma Crocker M.D.   On: 03/25/2015 09:33   . antiseptic oral rinse  7 mL Mouth Rinse BID  . antiseptic oral rinse  7 mL Mouth Rinse 10 times per day  . chlorhexidine gluconate  15 mL Mouth Rinse BID  . Darbepoetin Alfa      . darbepoetin (ARANESP) injection - DIALYSIS  200 mcg Intravenous Q Mon-HD  . feeding supplement (PRO-STAT SUGAR FREE 64)  30 mL Per Tube BID  . feeding supplement (VITAL HIGH PROTEIN)  1,000 mL Per Tube Q24H  . fentaNYL (SUBLIMAZE) injection  50 mcg Intravenous Once  . ferric gluconate (FERRLECIT/NULECIT) IV  125 mg Intravenous Q M,W,F-HD  . heparin  40 Units/kg Dialysis Once in dialysis  . imipenem-cilastatin  250 mg Intravenous Q12H  . insulin aspart  0-9 Units Subcutaneous 6 times per day  . ipratropium-albuterol  3 mL Nebulization Q6H  . multivitamin  1 tablet Oral BID  . pantoprazole (PROTONIX) IV  40 mg Intravenous Q24H    BMET    Component Value Date/Time   NA 136 03/26/2015 0359   K 3.7 03/26/2015 0359   CL 103 03/26/2015 0359   CO2 22 03/26/2015 0359   GLUCOSE 135* 03/26/2015 0359   BUN 37* 03/26/2015 0359   CREATININE 4.27* 03/26/2015 0359   CALCIUM 8.4* 03/26/2015 0359    GFRNONAA 9* 03/26/2015 0359   GFRAA 11* 03/26/2015 0359   CBC    Component Value Date/Time   WBC 13.5* 03/26/2015 0359   RBC 2.84* 03/26/2015 0359   HGB 8.2* 03/26/2015 0359   HCT 25.3* 03/26/2015 0359   PLT 161 03/26/2015 0359   MCV 89.1 03/26/2015 0359   MCH 28.9 03/26/2015 0359   MCHC 32.4 03/26/2015 0359   RDW 16.8*  03/26/2015 0359   LYMPHSABS 1.9 03/29/2015 2321   MONOABS 0.6 04/01/2015 2321   EOSABS 0.1 04/03/2015 2321   BASOSABS 0.1 03/20/2015 2321   . Assessment/Plan:  1. New ESRD.  Will need arrangements for outpatient dialysis once stable.  Tolerating HD well, continue with MWF schedule for now. 2. Anemia of chronic Zamora disease- on ESA and iron 3. HPTH- follow calcium, phos, iPTH 4. Pneumonia- possibly obstructive. S/p bronch on 03/23/15 awaiting cytology.  Plan per PCCM 5. New pulmonary lymphadenopathy and liver lesion- s/p bronch and awaiting cytology. may need biopsy to r/o malignancy.  PCCM following. 6. VDRF- per PCCM 7. DM 8. Dispo- continue with current level of care.  [place]

## 2015-03-26 NOTE — Procedures (Signed)
Patient was seen on dialysis and the procedure was supervised. BFR 400 Via RIJ TDC BP is 142/66.  Patient appears to be tolerating treatment well

## 2015-03-26 NOTE — Progress Notes (Signed)
Dialysis treatment completed.  2500 mL ultrafiltrated.  1500 mL net fluid removal.  Patient status unchanged. Lung sounds diminished to ausculation in all fields. Generalized BLE edema. Cardiac: Tachycardic.  Cleansed RIJ catheter with chlorhexidine.  Disconnected lines and flushed ports with saline per protocol.  Ports locked with heparin and capped per protocol.    Report given to bedside, RN Chrys Racer.

## 2015-03-26 NOTE — Progress Notes (Signed)
Arrived to patient room 2S-02 at 0850.  Reviewed treatment plan and this RN agrees with plan.  Report received from bedside RN, Chrys Racer.  Consent verified.  Patient Intubated, sedated, commanding.   Lung sounds diminished to ausculation in all fields. Generalized BLE edema. Cardiac:  Regular R&R.  Removed caps and cleansed RIJ catheter with chlorhedxidine.  Aspirated ports of heparin and flushed them with saline per protocol.  Connected and secured lines, initiated treatment at 0915.  UF Goal of 1548mL and net fluid removal 1L.  Will continue to monitor.

## 2015-03-27 ENCOUNTER — Inpatient Hospital Stay (HOSPITAL_COMMUNITY): Payer: Medicare Other

## 2015-03-27 LAB — GLUCOSE, CAPILLARY
GLUCOSE-CAPILLARY: 117 mg/dL — AB (ref 65–99)
GLUCOSE-CAPILLARY: 118 mg/dL — AB (ref 65–99)
GLUCOSE-CAPILLARY: 148 mg/dL — AB (ref 65–99)
Glucose-Capillary: 109 mg/dL — ABNORMAL HIGH (ref 65–99)
Glucose-Capillary: 109 mg/dL — ABNORMAL HIGH (ref 65–99)
Glucose-Capillary: 109 mg/dL — ABNORMAL HIGH (ref 65–99)
Glucose-Capillary: 133 mg/dL — ABNORMAL HIGH (ref 65–99)

## 2015-03-27 LAB — PHOSPHORUS: Phosphorus: 2.5 mg/dL (ref 2.5–4.6)

## 2015-03-27 LAB — CBC
HCT: 30.9 % — ABNORMAL LOW (ref 36.0–46.0)
Hemoglobin: 9.8 g/dL — ABNORMAL LOW (ref 12.0–15.0)
MCH: 29.2 pg (ref 26.0–34.0)
MCHC: 31.7 g/dL (ref 30.0–36.0)
MCV: 92 fL (ref 78.0–100.0)
PLATELETS: 207 10*3/uL (ref 150–400)
RBC: 3.36 MIL/uL — ABNORMAL LOW (ref 3.87–5.11)
RDW: 17.8 % — AB (ref 11.5–15.5)
WBC: 17.5 10*3/uL — AB (ref 4.0–10.5)

## 2015-03-27 LAB — BASIC METABOLIC PANEL
ANION GAP: 11 (ref 5–15)
BUN: 21 mg/dL — ABNORMAL HIGH (ref 6–20)
CALCIUM: 8.4 mg/dL — AB (ref 8.9–10.3)
CO2: 27 mmol/L (ref 22–32)
Chloride: 101 mmol/L (ref 101–111)
Creatinine, Ser: 2.35 mg/dL — ABNORMAL HIGH (ref 0.44–1.00)
GFR, EST AFRICAN AMERICAN: 22 mL/min — AB (ref >60)
GFR, EST NON AFRICAN AMERICAN: 19 mL/min — AB (ref >60)
GLUCOSE: 111 mg/dL — AB (ref 65–99)
Potassium: 3.9 mmol/L (ref 3.5–5.1)
Sodium: 139 mmol/L (ref 135–145)

## 2015-03-27 LAB — MAGNESIUM: Magnesium: 2 mg/dL (ref 1.7–2.4)

## 2015-03-27 LAB — PROCALCITONIN: PROCALCITONIN: 6.97 ng/mL

## 2015-03-27 MED ORDER — VANCOMYCIN HCL IN DEXTROSE 1-5 GM/200ML-% IV SOLN
1000.0000 mg | INTRAVENOUS | Status: DC
  Administered 2015-03-28: 1000 mg via INTRAVENOUS
  Filled 2015-03-27 (×2): qty 200

## 2015-03-27 NOTE — Progress Notes (Signed)
Pharmacy Antibiotic Follow-up Note  Stacey Zamora is a 80 y.o. year-old female admitted on 03/14/2015.  The patient is currently on day #4  Primaxin and day #5 vancomycin (restarted) for RLL PNA. WBC up to 17.5, PCT decreased to 6.97. New ESRD, plan is for HD on MWF, tolerated full session yesterday.  ANTIBIOTICS: Zosyn 1/13>>1/21  Vanc 1/13 >> 1/15; 1/20>>  Primaxin 1/21>>  Outpt LVQ 1/2>>1/9. planned thru 1/12 but stopped d/t hallucinatins after 8 days   Microbiology: 1/13 blood x 2 - NEG  1/13 influenza neg  1/14 MRSA screen neg  1/18 Cdiff toxin: neg  1/19 GI panel: NEG  1/20 BAL: ngtd  1/21 fungal:  1/21 legionella:  Levels: 1/20 pre-HD VR: 13  Plan:  Primaxin 250 mg IV q12h Vancomycin 1000 mg IV after HD on MWF Monitor clinical progress, c/s, abx plan/LOT Vanc levels as indicated Monitor HD schedule/tolerance in new ESRD patient   Temp (24hrs), Avg:98.4 F (36.9 C), Min:97.9 F (36.6 C), Max:99 F (37.2 C)   Recent Labs Lab 03/23/15 0445 03/24/15 0411 03/25/15 0504 03/26/15 0359 03/27/15 0625  WBC 13.2* 13.9* 14.3* 13.5* 17.5*     Recent Labs Lab 03/23/15 0445 03/24/15 0411 03/25/15 0504 03/26/15 0359 03/27/15 0625  CREATININE 4.07* 3.11* 3.72* 4.27* 2.35*   Estimated Creatinine Clearance: 21 mL/min (by C-G formula based on Cr of 2.35).    Allergies  Allergen Reactions  . Eggs Or Egg-Derived Products Hives  . Morphine And Related Itching and Other (See Comments)    Crawling sensations  . Vicodin [Hydrocodone-Acetaminophen] Itching and Other (See Comments)    Crawling sensations  . Banana Other (See Comments)    Pt does not eat bananas  . Levaquin [Levofloxacin] Other (See Comments)    Hallucinations: Pt was to take 10 day course, but began to hallucinate after day 8.  . Shellfish Allergy Other (See Comments)    Pt doesn't eat SeaFood at all. No Fish, no Shrimp, etc    [place], Pharm.D., BCPS Clinical Pharmacist Pager:  [phone] 03/27/2015 1:15 PM

## 2015-03-27 NOTE — Progress Notes (Signed)
Fentanyl drip 276ml flushed down sink  And witnessed by Warden Fillers, RN.

## 2015-03-27 NOTE — Progress Notes (Signed)
Patient ID: Stacey Zamora, female   DOB: Apr 09, 1935, 80 y.o.   MRN: [id number] S:awake and alert and ready to be extubated O:BP 97/48 mmHg  Pulse 99  Temp(Src) 97.9 F (36.6 C) (Oral)  Resp 16  Ht 5\' 2"  (1.575 m)  Wt 91 kg (200 lb 9.9 oz)  BMI 36.68 kg/m2  SpO2 99%  Intake/Output Summary (Last 24 hours) at 03/27/15 [id number] Last data filed at 03/27/15 0700  Gross per 24 hour  Intake 2025.36 ml  Output   1500 ml  Net 525.36 ml   Intake/Output: I/O last 3 completed shifts: In: 3307.3 [I.V.:1267.3; NG/GT:1440; IV Piggyback:600] Out: 1500 [Other:1500]  Intake/Output this shift:    Weight change: 0 kg (0 lb) Gen:WD WN AAF intubated but awake and in NAD CVS:no rub Resp:decreased BS [id number] Ext:LUE AVF +T/B, +1 edema   Recent Labs Lab 03/21/15 1610 03/22/15 0415 03/23/15 0445 03/24/15 0411 03/25/15 0504 03/26/15 0359 03/27/15 0625  NA 138 140 132* 137 137 136 139  K 3.2* 3.6 3.3* 3.4* 3.2* 3.7 3.9  CL 101 101 98* 98* 100* 103 101  CO2 21* 23 22 22 23 22 27   GLUCOSE 95 86 99 86 99 135* 111*  BUN 35* 21* 25* 15 24* 37* 21*  CREATININE 4.21* 3.21* 4.07* 3.11* 3.72* 4.27* 2.35*  ALBUMIN 1.9* 1.8* 1.8* 1.8*  --   --   --   CALCIUM 7.8* 7.6* 7.7* 7.9* 8.2* 8.4* 8.4*  PHOS 5.2* 4.1 4.3 3.2 3.4 4.2 2.5  AST 31 31 30 28   --   --   --   ALT 12* 12* 12* 13*  --   --   --    Liver Function Tests:  Recent Labs Lab 03/22/15 0415 03/23/15 0445 03/24/15 0411  AST 31 30 28   ALT 12* 12* 13*  ALKPHOS 40 47 42  BILITOT 0.8 0.9 0.8  PROT 4.6* 4.8* 4.8*  ALBUMIN 1.8* 1.8* 1.8*    Recent Labs Lab 03/21/15 1610  LIPASE 58*  AMYLASE 109*   No results for input(s): AMMONIA in the last 168 hours. CBC:  Recent Labs Lab 03/23/15 0445 03/24/15 0411 03/25/15 0504 03/26/15 0359 03/27/15 0625  WBC 13.2* 13.9* 14.3* 13.5* 17.5*  HGB 9.4* 8.0* 8.1* 8.2* 9.8*  HCT 27.8* 23.7* 24.2* 25.3* 30.9*  MCV 83.5 84.3 87.4 89.1 92.0  PLT 143* 156 165 161 207   Cardiac  Enzymes:  Recent Labs Lab 03/21/15 2310 03/22/15 0415 03/22/15 2036 03/23/15 0055 03/23/15 0445  TROPONINI 0.21* 0.28* 0.08* 0.09* 0.29*   CBG:  Recent Labs Lab 03/26/15 1132 03/26/15 1610 03/26/15 1932 03/26/15 2352 03/27/15 0742  GLUCAP 116* 132* 114* 109* 109*    Iron Studies: No results for input(s): IRON, TIBC, TRANSFERRIN, FERRITIN in the last 72 hours. Studies/Results: Dg Chest Port 1 View  03/27/2015  CLINICAL DATA:  Related dependent respiratory failure, pneumonia, sepsis, acute on chronic renal failure, current smoker. EXAM: PORTABLE CHEST 1 VIEW COMPARISON:  Portable chest x-ray of March 26, 2015 FINDINGS: The left lung is well-expanded. The interstitial markings are slightly more conspicuous diffusely on the left. On the right confluent interstitial opacities are slightly more conspicuous. Increased density in the inferior 1/2 of the right pleural space is stable. The cardiac silhouette is top-normal in size. The central pulmonary vascularity is engorged. The endotracheal tube tip projects 3.2 cm above the carina. The esophagogastric tube tip projects below the inferior margin of the image. The dual-lumen dialysis catheter tip projects at the  right cavoatrial junction. IMPRESSION: Slight overall worsening of interstitial edema bilaterally. Persistent increased density throughout much of the right lung worrisome for superimposed pneumonia. There is probable moderate-sized right pleural effusion. The support tubes are in reasonable position. Electronically Signed   By: David  Martinique M.D.   On: 03/27/2015 07:33   Dg Chest Port 1 View  03/26/2015  CLINICAL DATA:  Respiratory failure, pneumonia, sepsis, pulmonary edema, acute and chronic renal failure, current smoker. EXAM: PORTABLE CHEST 1 VIEW COMPARISON:  Portable chest x-ray of March 25, 2015. FINDINGS: The left lung is well-expanded. The interstitial markings are mildly increased on the left but slightly less conspicuous  today. On the right there is volume loss amounting to approximately 1/2 of the hemi thorax. There are near confluent interstitial infiltrates throughout the aerated portion of the left upper lobe. There is a small right pleural effusion. The cardiac silhouette is normal in size. The pulmonary vascularity is less prominent centrally today. The endotracheal tube tip lies 3.5 cm above the carina. The esophagogastric tube tip projects below the inferior margin of the image. The left internal jugular venous catheter tip lies in the junction of the left internal jugular vein with the left subclavian vein. The dual-lumen dialysis catheter tip projects at the cavoatrial junction. IMPRESSION: Slight interval improvement in the appearance of the pulmonary interstitium bilaterally. Considerable volume loss on the right consistent with pneumonia and pleural effusion remains. Electronically Signed   By: David  Martinique M.D.   On: 03/26/2015 07:19   . antiseptic oral rinse  7 mL Mouth Rinse 10 times per day  . chlorhexidine gluconate  15 mL Mouth Rinse BID  . darbepoetin (ARANESP) injection - DIALYSIS  200 mcg Intravenous Q Mon-HD  . feeding supplement (PRO-STAT SUGAR FREE 64)  30 mL Per Tube BID  . feeding supplement (VITAL HIGH PROTEIN)  1,000 mL Per Tube Q24H  . ferric gluconate (FERRLECIT/NULECIT) IV  125 mg Intravenous Q M,W,F-HD  . heparin  40 Units/kg Dialysis Once in dialysis  . imipenem-cilastatin  250 mg Intravenous Q12H  . insulin aspart  0-9 Units Subcutaneous 6 times per day  . ipratropium-albuterol  3 mL Nebulization Q6H  . multivitamin  1 tablet Oral BID  . pantoprazole sodium  40 mg Per Tube Daily    BMET    Component Value Date/Time   NA 139 03/27/2015 0625   K 3.9 03/27/2015 0625   CL 101 03/27/2015 0625   CO2 27 03/27/2015 0625   GLUCOSE 111* 03/27/2015 0625   BUN 21* 03/27/2015 0625   CREATININE 2.35* 03/27/2015 0625   CALCIUM 8.4* 03/27/2015 0625   GFRNONAA 19* 03/27/2015 0625    GFRAA 22* 03/27/2015 0625   CBC    Component Value Date/Time   WBC 17.5* 03/27/2015 0625   RBC 3.36* 03/27/2015 0625   HGB 9.8* 03/27/2015 0625   HCT 30.9* 03/27/2015 0625   PLT 207 03/27/2015 0625   MCV 92.0 03/27/2015 0625   MCH 29.2 03/27/2015 0625   MCHC 31.7 03/27/2015 0625   RDW 17.8* 03/27/2015 0625   LYMPHSABS 1.9 03/11/2015 2321   MONOABS 0.6 03/12/2015 2321   EOSABS 0.1 03/21/2015 2321   BASOSABS 0.1 03/18/2015 2321     Assessment/Plan:  1. New ESRD. Will need arrangements for outpatient dialysis once stable. Tolerating HD well, continue with MWF schedule for now and will continue to challenge edw since she has some edema. 2. Anemia of chronic Zamora disease- on ESA and iron 3. HPTH- follow calcium,  phos, iPTH 4. Pneumonia- possibly obstructive. S/p bronch on 03/23/15 awaiting cytology. Plan per PCCM 5. New pulmonary lymphadenopathy and liver lesion- s/p bronch and awaiting cytology. may need biopsy to r/o malignancy. PCCM following. 6. VDRF- for extubation today per PCCM 7. DM 8. Dispo- continue with current level of care. [place]

## 2015-03-27 NOTE — Progress Notes (Signed)
Utilization Review Completed.  

## 2015-03-27 NOTE — Progress Notes (Signed)
Patient attempting to remove bipap mask at this time. While fixing mask placement, left IJ noted to be leaking, NS only infusing at this time. Unable to gain peripheral access due to restricted bilateral extremities. CCM notified at this time.

## 2015-03-27 NOTE — Procedures (Signed)
Extubation Procedure Note  Patient Details:   Name: LADETRA TORRENS DOB: 29-Dec-1935 MRN: [id number]   Airway Documentation:     Evaluation  O2 sats: stable throughout Complications: No apparent complications Patient did tolerate procedure well. Bilateral Breath Sounds: Rhonchi Suctioning: Airway Yes   Positive cuff leak noted prior to extubation. Pt placed on [place] 4 Lpm with humidity, Sat 88-91%. Pt placed on Venturi mask 8L 40%, sat 95%. Pt and family instructed on flutter (good effort) and incentive spirometry (weaker effort) ~ 500 VT. RT will continue to monitor.  [place] Amber Kjersten [place] 03/27/2015, 11:08 AM

## 2015-03-27 NOTE — Progress Notes (Signed)
PULMONARY / CRITICAL CARE MEDICINE   Name: RUVI CRIVELLI MRN: [id number] DOB: 1935/11/15    ADMISSION DATE:  04/02/2015 CONSULTATION DATE:  03/21/2015  REFERRING MD :  Anesthesiology/ surgery  PRIMARY SERVICE: CCM  HISTORY OF PRESENT ILLNESS:  Patient is intubated. History obtained from anesthesiologist Dr. Linna Caprice and [facility] documentation.  Patient is a 80 yo F with a PMHx of HTN, asthma, breast ca, DM, HLD, CKD stage 3 who was found unresponsive after HD cath placement 1/16. Patient was admitted for acute hypoxic respiratory failure from PNA. She underwent procedure for RIJ cath and LUA AVF by vascular surgery pm 1/16. As per anesthesiology, patient received Versed 4 mg, Fentanyl 50 mcg, Neo-synephrine 4770 mcg, Precedex 69 mcg, and Etomidate 20 mg during the procedure. She continued to be hypotensive during the procedure. Amount of blood loss is not reported in EPIC. Patient was receiving IV Lasix 160 mg q6 prior to the procedure.   As per anesthesiologist and nursing staff, when patient initially came into the PACU after the procedure she was arousable and following commands, however, was hypotensive with systolic in the [id number]. She was given Neo-synephrine in the PACU and subsequently became more obtunded and then unresponsive. She was also given Albumin 5% 12.5 g and Calcium 1g. Patient was intubated by anesthesiology to protect her airway. She was given Etomidate 8 mg and Succinylcholine 100 mg during the process. No Precedex was administered. ABG was obtained 30 mins after intubation and showed pH 7.25, PCO2 52.6, and PO2 261. Labs were also obtained at this time and revealed Na 142, K 4.2, H/H 8.2/24. Patient was continued on Neo-synephrine 30 mcg/ min for hypotension.   SUBJECTIVE / Interval events:  Tolerated PSV most of the day 1/23, and now on PSV Good cough, wide awake and wants to be extubated.    VITAL SIGNS: Temp:  [97.9 F (36.6 C)-99.3 F (37.4 C)] 97.9 F (36.6 C) (01/24  0745) Pulse Rate:  [92-114] 111 (01/24 0900) Resp:  [0-38] 20 (01/24 0900) BP: (75-138)/(43-90) 114/46 mmHg (01/24 0900) SpO2:  [95 %-100 %] 97 % (01/24 0900) FiO2 (%):  [0.8 %-40 %] 40 % (01/24 0731) Weight:  [89.1 kg (196 lb 6.9 oz)-91 kg (200 lb 9.9 oz)] 91 kg (200 lb 9.9 oz) (01/24 0500) HEMODYNAMICS: CVP:  [7 mmHg-8 mmHg] 7 mmHg VENTILATOR SETTINGS: Vent Mode:  [-] PSV;CPAP FiO2 (%):  [0.8 %-40 %] 40 % Set Rate:  [16 bmp] 16 bmp Vt Set:  [500 mL] 500 mL PEEP:  [5 cmH20] 5 cmH20 Pressure Support:  [5 cmH20-10 cmH20] 5 cmH20 Plateau Pressure:  [20 cmH20-25 cmH20] 20 cmH20 INTAKE / OUTPUT: Intake/Output      01/23 0701 - 01/24 0700 01/24 0701 - 01/25 0700   I.V. (mL/kg) 905.4 (9.9)    NG/GT 960    IV Piggyback 400    Total Intake(mL/kg) 2265.4 (24.9)    Urine (mL/kg/hr) 0 (0)    Other 1500 (0.7)    Total Output 1500     Net +765.4            PHYSICAL EXAMINATION: General:  Awake, no distress. EET tube in place. Neuro: Opens eyes, follows commands, Moves all 4 extremities HEENT: Oral ETT. Moist mucus membranes Cardiovascular: RRR. S1, S2, No MRG Lungs: Clear, No wheeze or crackles. Abdomen: Soft. +BS. Non tender, non distended Skin:  Intact.  LABS:  CBC  Recent Labs Lab 03/25/15 0504 03/26/15 0359 03/27/15 0625  WBC 14.3* 13.5* 17.5*  HGB  8.1* 8.2* 9.8*  HCT 24.2* 25.3* 30.9*  PLT 165 161 207   Coag's No results for input(s): APTT, INR in the last 168 hours. BMET  Recent Labs Lab 03/25/15 0504 03/26/15 0359 03/27/15 0625  NA 137 136 139  K 3.2* 3.7 3.9  CL 100* 103 101  CO2 23 22 27   BUN 24* 37* 21*  CREATININE 3.72* 4.27* 2.35*  GLUCOSE 99 135* 111*   Electrolytes  Recent Labs Lab 03/25/15 0504 03/26/15 0359 03/27/15 0625  CALCIUM 8.2* 8.4* 8.4*  MG 1.6* 2.2 2.0  PHOS 3.4 4.2 2.5   Sepsis Markers  Recent Labs Lab 03/21/15 1619 03/21/15 1916  03/25/15 1140 03/26/15 0359 03/27/15 0625  LATICACIDVEN 1.5 1.5  --   --   --   --    PROCALCITON  --   --   < > 8.17 8.31 6.97  < > = values in this interval not displayed. ABG  Recent Labs Lab 03/21/15 0955  PHART 7.419  PCO2ART 33.1*  PO2ART 84.0   Liver Enzymes  Recent Labs Lab 03/22/15 0415 03/23/15 0445 03/24/15 0411  AST 31 30 28   ALT 12* 12* 13*  ALKPHOS 40 47 42  BILITOT 0.8 0.9 0.8  ALBUMIN 1.8* 1.8* 1.8*   Cardiac Enzymes  Recent Labs Lab 03/22/15 2036 03/23/15 0055 03/23/15 0445  TROPONINI 0.08* 0.09* 0.29*   Glucose  Recent Labs Lab 03/26/15 0724 03/26/15 1132 03/26/15 1610 03/26/15 1932 03/26/15 2352 03/27/15 0742  GLUCAP 116* 116* 132* 114* 109* 109*    Events: 1/13 Admit with RLL PNA, HCAP. 1/16 Transfer to [facility] for R IJ HD cath placement as well as an AV fistula. Intubated in PACU for encephalopathy 1/18 Extubated and then reintubated. 1/20 Bronch with BAL  ASSESSMENT / PLAN:  PULMONARY A: Acute hypoxic hypercarbic respiratory failure likely from PNA + sedating meds ? Lung malignancy with RLL, RML obstruction and LAD Hx of Asthma   PNA, ? Aspiration vs obstructive  P:   - Continue mechanical ventilation. - goal extubation 1/24. Will need to continue conversations w her and family regarding re-intubation if she fails. At this juncture I suspect they would support reintubation although I have cautioned that she would not do well if she required prolonged vent support.  - Follow bronch results, cytology still pending - Abx as below.  - Continue nebs   CARDIOVASCULAR A: Hypotension during AVF and HD cath placement - likely due to sedating meds in the setting of pulmonary HTN +/- sepsis. Echo shows high PA pressure.   Elevated troponin - EKG normal  Transient Afib P:  - off amiodarone and phenylephrine - Monitor hemodynamics  - Lt IJ CVL in left superior mediastinal vein.   RENAL A:  AKI superimposed on chronic kidney disease, starting HD Metabolic acidosis P:   -HD as per renal -goal volume removal as BP  will tolerate -follow BMP  GASTROINTESTINAL A:  Hx of GERD Recurrent abd pain  Pancreatitis ? Ileus P:   - Protonix  - On tube feeds > hold for extubation 1/24  HEMATOLOGIC A:  Anemia of chronic disease from ESRD and possible acute blood loss from surgery. Lower GI bleed- ? hemmoroids P:  -Monitor CBC -Transfuse for Hgb <7.  INFECTIOUS A: Sepsis from pneumonia  Leukocytosis  C diff and stool panel negative.  Zosyn 1/13 >> 1/21 Vanco 1/13 >> 1/15. Restart 1/20 >>  Primaxin 1/21 >>  P:   -Monitor CBC -Continue vanco and primaxin  ENDOCRINE  A:  HX of DM2 P:   -Monitor CBGs -SSI  NEUROLOGIC A:  Acute encephalopathy 2/2 pneumonia, sepsis, renal failure, respiratory failure  AMS in the setting of hypotension P:   -Continue to monitor  -Fentanyl gtt for sedation, goal off 1/24 -Versed PRN  SCDs. Holding heprarin SQ as she had hemorrhoidal bleed recently  TODAY'S SUMMARY: Admitted due to right lower lobe pneumonia, presumed HCAP. CT and bronch findings concerning for right mass with compression of bronchus intermedius, postobstructive pneumonia. Patient has history of breast cancer with right mastectomy, lymph node dissection, XRT to that area.   Dr Vaughan Browner had discussions with the family 1/21-22 regarding these findings. Concerned that she may not tolerate another extubation since her chest x-ray is unchanged. I have asked them to discuss with the extended family and patient  Daughters and granddaughter updated at bedside 1/23 and 1/24   Independent Critical care time- 40 mins.   Baltazar Apo, MD, PhD 03/27/2015, 9:51 AM [facility] [phone] or if no answer [phone]

## 2015-03-27 NOTE — Care Management Note (Signed)
Case Management Note  Patient Details  Name: Stacey Zamora MRN: [id number] Date of Birth: Jul 01, 1935  Subjective/Objective:     Pt lives with dtr who will be able to provide 24/7 assistance when she is discharged.  Requested extubation, now on BiPAP.                     Expected Discharge Plan:  Skilled Nursing Facility  In-House Referral:  Clinical Social Work  Discharge planning Services  CM Consult  Status of Service:  In process, will continue to follow  Medicare Important Message Given:  Yes  Girard Cooter, RN 03/27/2015, 2:12 PM

## 2015-03-28 ENCOUNTER — Inpatient Hospital Stay (HOSPITAL_COMMUNITY): Payer: Medicare Other

## 2015-03-28 LAB — BASIC METABOLIC PANEL
ANION GAP: 21 — AB (ref 5–15)
BUN: 32 mg/dL — ABNORMAL HIGH (ref 6–20)
CO2: 22 mmol/L (ref 22–32)
Calcium: 8.5 mg/dL — ABNORMAL LOW (ref 8.9–10.3)
Chloride: 96 mmol/L — ABNORMAL LOW (ref 101–111)
Creatinine, Ser: 3.05 mg/dL — ABNORMAL HIGH (ref 0.44–1.00)
GFR calc Af Amer: 16 mL/min — ABNORMAL LOW (ref >60)
GFR calc non Af Amer: 14 mL/min — ABNORMAL LOW (ref >60)
GLUCOSE: 138 mg/dL — AB (ref 65–99)
POTASSIUM: 4.4 mmol/L (ref 3.5–5.1)
Sodium: 139 mmol/L (ref 135–145)

## 2015-03-28 LAB — GLUCOSE, CAPILLARY
GLUCOSE-CAPILLARY: 121 mg/dL — AB (ref 65–99)
GLUCOSE-CAPILLARY: 107 mg/dL — AB (ref 65–99)
Glucose-Capillary: 117 mg/dL — ABNORMAL HIGH (ref 65–99)
Glucose-Capillary: 121 mg/dL — ABNORMAL HIGH (ref 65–99)
Glucose-Capillary: 89 mg/dL (ref 65–99)

## 2015-03-28 LAB — PHOSPHORUS: Phosphorus: 4.1 mg/dL (ref 2.5–4.6)

## 2015-03-28 LAB — VANCOMYCIN, RANDOM: VANCOMYCIN RM: 14 ug/mL

## 2015-03-28 LAB — MAGNESIUM: Magnesium: 2.1 mg/dL (ref 1.7–2.4)

## 2015-03-28 NOTE — Progress Notes (Signed)
PULMONARY / CRITICAL CARE MEDICINE   Name: Stacey Zamora MRN: [id number] DOB: 04/13/1935    ADMISSION DATE:  03/29/2015 CONSULTATION DATE:  03/24/2015  REFERRING MD :  Anesthesiology/ surgery  PRIMARY SERVICE: CCM  SUBJECTIVE:  RN reports central line pulled out overnight.  No IV access with BUE's unable to stick (s/p mastectomy & AVF),  Using HD cath for IV access at this point.  Pt remains on bipap.     VITAL SIGNS: Temp:  [98.1 F (36.7 C)-99.1 F (37.3 C)] 98.7 F (37.1 C) (01/25 0739) Pulse Rate:  [28-128] 99 (01/25 0600) Resp:  [17-30] 22 (01/25 0700) BP: (114-181)/(46-104) 150/62 mmHg (01/25 0500) SpO2:  [72 %-100 %] 82 % (01/25 0744) FiO2 (%):  [40 %] 40 % (01/25 0744) Weight:  [208 lb 12.4 oz (94.7 kg)] 208 lb 12.4 oz (94.7 kg) (01/25 0500)   HEMODYNAMICS: CVP:  [14 mmHg] 14 mmHg   VENTILATOR SETTINGS: Vent Mode:  [-] BIPAP FiO2 (%):  [40 %] 40 % Set Rate:  [8 bmp-12 bmp] 12 bmp PEEP:  [6 cmH20] 6 cmH20   INTAKE / OUTPUT: Intake/Output      01/24 0701 - 01/25 0700 01/25 0701 - 01/26 0700   I.V. (mL/kg) 345.6 (3.6)    NG/GT 80    IV Piggyback 200    Total Intake(mL/kg) 625.6 (6.6)    Urine (mL/kg/hr)     Other     Stool 1 (0)    Total Output 1     Net +624.6            PHYSICAL EXAMINATION: General:  Chronically ill appearing female in NAD on bipap Neuro:  Opens eyes to voice, interacts appropriately, generalized weakness / MAE HEENT: BiPAP in place. Moist mucus membranes Cardiovascular: RRR. S1, S2, No MRG Lungs: non-labored, lungs bilaterally with coarse rhonchi Abdomen: Soft. +BS. Non tender, non distended Skin:  Intact.  LABS:  CBC  Recent Labs Lab 03/25/15 0504 03/26/15 0359 03/27/15 0625  WBC 14.3* 13.5* 17.5*  HGB 8.1* 8.2* 9.8*  HCT 24.2* 25.3* 30.9*  PLT 165 161 207   Coag's No results for input(s): APTT, INR in the last 168 hours.   BMET  Recent Labs Lab 03/26/15 0359 03/27/15 0625 03/28/15 0331  NA 136 139 139  K 3.7 3.9  4.4  CL 103 101 96*  CO2 22 27 22   BUN 37* 21* 32*  CREATININE 4.27* 2.35* 3.05*  GLUCOSE 135* 111* 138*   Electrolytes  Recent Labs Lab 03/26/15 0359 03/27/15 0625 03/28/15 0331  CALCIUM 8.4* 8.4* 8.5*  MG 2.2 2.0 2.1  PHOS 4.2 2.5 4.1   Sepsis Markers  Recent Labs Lab 03/21/15 1619 03/21/15 1916  03/25/15 1140 03/26/15 0359 03/27/15 0625  LATICACIDVEN 1.5 1.5  --   --   --   --   PROCALCITON  --   --   < > 8.17 8.31 6.97  < > = values in this interval not displayed.   ABG  Recent Labs Lab 03/21/15 0955  PHART 7.419  PCO2ART 33.1*  PO2ART 84.0   Liver Enzymes  Recent Labs Lab 03/22/15 0415 03/23/15 0445 03/24/15 0411  AST 31 30 28   ALT 12* 12* 13*  ALKPHOS 40 47 42  BILITOT 0.8 0.9 0.8  ALBUMIN 1.8* 1.8* 1.8*   Cardiac Enzymes  Recent Labs Lab 03/22/15 2036 03/23/15 0055 03/23/15 0445  TROPONINI 0.08* 0.09* 0.29*   Glucose  Recent Labs Lab 03/27/15 1203 03/27/15 1534 03/27/15 1907  03/27/15 2351 03/28/15 0342 03/28/15 0737  GLUCAP 118* 148* 133* 117* 117* 121*    Events: 1/13  Admit with RLL PNA, HCAP. 1/16  Transfer to [facility] for R IJ HD cath placement as well as an AV fistula. Intubated in PACU for encephalopathy 1/18  Extubated and then reintubated. 1/20  Bronch with BAL 1/25  Extubated   ASSESSMENT / PLAN:  PULMONARY A:  Acute hypoxic hypercarbic respiratory failure likely from PNA + sedating medications for procedure ? Lung malignancy with RLL, RML obstruction and LAD - CT and bronch findings concerning for right mass with compression of bronchus intermedius, postobstructive pneumonia. Patient has history of breast cancer with right mastectomy, lymph node dissection, XRT to that area.  Hx of Asthma   PNA, ? Aspiration vs obstructive  P:   - Pulmonary hygiene - Monitor closely for reintubation need.  Will need to continue conversations w her and family regarding re-intubation if she fails. Suspect they would support  reintubation although they have been cautioned that she would not do well if she required prolonged vent support.  - PRN BiPAP for increased WOB - Wean O2 for sats > 92% - Follow bronch results, cytology >> - Abx as below.  - Continue nebs   CARDIOVASCULAR A:  Hypotension - during AVF and HD cath placement, likely due to sedating meds in the setting of pulmonary HTN +/- sepsis. Echo shows high PA pressure.   Pulmonary Hypertension Elevated troponin - EKG normal  Transient Afib P:  - Monitor hemodynamics  - May need further IV access placement  RENAL A:   AKI superimposed on chronic kidney disease, starting HD Metabolic acidosis P:   - HD as per renal - goal volume removal as BP will tolerate - follow BMP  GASTROINTESTINAL A:   Hx of GERD Recurrent abd pain  Pancreatitis ? Ileus P:   - Protonix  - NPO   HEMATOLOGIC A:   Anemia of chronic disease from ESRD and possible acute blood loss from surgery. Lower GI bleed - suspect hemmoroids P:  - Monitor CBC - Transfuse for Hgb <7. - SCD's for DVT prophylaxis given recent hemorrhoidal bleeding   INFECTIOUS A:  Sepsis from pneumonia  Leukocytosis  C diff and stool panel negative.  Zosyn 1/13 >> 1/21 Vanco 1/13 >> 1/15. Restart 1/20 >>  Primaxin 1/21 >>  P:   - Monitor CBC - Continue vanco and primaxin  ENDOCRINE A:   DM2 P:   - Monitor CBGs - SSI  NEUROLOGIC A:   Acute encephalopathy 2/2 pneumonia, sepsis, renal failure, respiratory failure  AMS in the setting of hypotension P:   - Serial neuro exams, monitor mental status - Minimize sedating medications as able      Noe Gens, NP-C [facility] Pgr: [phone] or if no answer [phone] 03/28/2015, 9:00 AM   Attending Note:  I have examined patient, reviewed labs, studies and notes. I have discussed the case with B Ollis, and I agree with the data and plans as amended above. Extubated with difficulty after general  anesthesia for per cath placement. Now has required BiPAp last 24h. On my eval she is stable with adequate WOB on BiPAP. She has not been able to tolerate breaks from PPV. Her CXR shows persistent RLL infiltrate / collapse. She is high risk for intubation. Cytology from FOB 1/20 still not reported, will call to insure this is to be read. Independent critical care time is 35 minutes.  Baltazar Apo, MD, PhD 03/28/2015, 2:38 PM [facility] [phone] or if no answer [phone]

## 2015-03-28 NOTE — Progress Notes (Signed)
Dr. Nelda Marseille made aware of patient in SVT rate in 150's.  Ordered to be placed back on BIPAP and he will send a physician over to see patient.  Patient having difficulty breathing and is labored currently.  Dr. Lamonte Sakai here at bedside within 5 min.  Spoke with patient and then called family to update them on patient's status.  Will continue to monitor patient at this time.

## 2015-03-28 NOTE — Progress Notes (Signed)
[facility] Progress Note Patient Name: Stacey Zamora DOB: Apr 15, 1935 MRN: [id number]   Date of Service  03/28/2015  HPI/Events of Note  Patient pulled out central venous catheter. IV team unable to establish venous access. Patient has HD catheter. OK to use HD catheter for venous access per Nephrology.   eICU Interventions  Will order: 1. Communication - OK to use HD catheter for venous access.      Intervention Category Minor Interventions: Routine modifications to care plan (e.g. PRN medications for pain, fever)  Sommer,Steven Eugene 03/28/2015, 1:32 AM

## 2015-03-28 NOTE — Progress Notes (Signed)
Patient ID: Stacey Zamora, female   DOB: 04/15/1935, 80 y.o.   MRN: [id number] S:extubated yesterday but required BiPap O:BP 150/62 mmHg  Pulse 99  Temp(Src) 98.7 F (37.1 C) (Axillary)  Resp 22  Ht 5\' 2"  (1.575 m)  Wt 94.7 kg (208 lb 12.4 oz)  BMI 38.18 kg/m2  SpO2 82%  Intake/Output Summary (Last 24 hours) at 03/28/15 0910 Last data filed at 03/28/15 0300  Gross per 24 hour  Intake    450 ml  Output      1 ml  Net    449 ml   Intake/Output: I/O last 3 completed shifts: In: 1747.7 [I.V.:887.7; NG/GT:560; IV Piggyback:300] Out: 1 [Stool:1]  Intake/Output this shift:    Weight change: 4.4 kg (9 lb 11.2 oz) Gen:WD AAF wearing Bipap and appears comfortable CVS:no rub Resp:occ rhonchi [id number] Ext:LUE AVF +T/B, 1+ edema   Recent Labs Lab 03/21/15 1610 03/22/15 0415 03/23/15 0445 03/24/15 0411 03/25/15 0504 03/26/15 0359 03/27/15 0625 03/28/15 0331  NA 138 140 132* 137 137 136 139 139  K 3.2* 3.6 3.3* 3.4* 3.2* 3.7 3.9 4.4  CL 101 101 98* 98* 100* 103 101 96*  CO2 21* 23 22 22 23 22 27 22   GLUCOSE 95 86 99 86 99 135* 111* 138*  BUN 35* 21* 25* 15 24* 37* 21* 32*  CREATININE 4.21* 3.21* 4.07* 3.11* 3.72* 4.27* 2.35* 3.05*  ALBUMIN 1.9* 1.8* 1.8* 1.8*  --   --   --   --   CALCIUM 7.8* 7.6* 7.7* 7.9* 8.2* 8.4* 8.4* 8.5*  PHOS 5.2* 4.1 4.3 3.2 3.4 4.2 2.5 4.1  AST 31 31 30 28   --   --   --   --   ALT 12* 12* 12* 13*  --   --   --   --    Liver Function Tests:  Recent Labs Lab 03/22/15 0415 03/23/15 0445 03/24/15 0411  AST 31 30 28   ALT 12* 12* 13*  ALKPHOS 40 47 42  BILITOT 0.8 0.9 0.8  PROT 4.6* 4.8* 4.8*  ALBUMIN 1.8* 1.8* 1.8*    Recent Labs Lab 03/21/15 1610  LIPASE 58*  AMYLASE 109*   No results for input(s): AMMONIA in the last 168 hours. CBC:  Recent Labs Lab 03/23/15 0445 03/24/15 0411 03/25/15 0504 03/26/15 0359 03/27/15 0625  WBC 13.2* 13.9* 14.3* 13.5* 17.5*  HGB 9.4* 8.0* 8.1* 8.2* 9.8*  HCT 27.8* 23.7* 24.2* 25.3* 30.9*  MCV  83.5 84.3 87.4 89.1 92.0  PLT 143* 156 165 161 207   Cardiac Enzymes:  Recent Labs Lab 03/21/15 2310 03/22/15 0415 03/22/15 2036 03/23/15 0055 03/23/15 0445  TROPONINI 0.21* 0.28* 0.08* 0.09* 0.29*   CBG:  Recent Labs Lab 03/27/15 1534 03/27/15 1907 03/27/15 2351 03/28/15 0342 03/28/15 0737  GLUCAP 148* 133* 117* 117* 121*    Iron Studies: No results for input(s): IRON, TIBC, TRANSFERRIN, FERRITIN in the last 72 hours. Studies/Results: Dg Chest Port 1 View  03/28/2015  CLINICAL DATA:  Respiratory failure. EXAM: PORTABLE CHEST 1 VIEW COMPARISON:  03/27/2015. FINDINGS: Interim extubation removal of NG tube. Interim removal of left IJ line. Dialysis catheter stable position with tip projected over right atrium. Heart size stable. Diffuse right lung infiltrate. No change. Mild infiltrate left lung base cannot be excluded, no change. Persistent right pleural effusion. IMPRESSION: 1. Interim extubation removal of NG tube. Interim removal of left IJ line. Right dialysis catheter stable position. 2. Persistent unchanged diffuse right lung infiltrate and right  pleural effusion. Mild left base infiltrate cannot be excluded. Electronically Signed   By: Marcello Moores  Register   On: 03/28/2015 08:06   Dg Chest Port 1 View  03/27/2015  CLINICAL DATA:  Related dependent respiratory failure, pneumonia, sepsis, acute on chronic renal failure, current smoker. EXAM: PORTABLE CHEST 1 VIEW COMPARISON:  Portable chest x-ray of March 26, 2015 FINDINGS: The left lung is well-expanded. The interstitial markings are slightly more conspicuous diffusely on the left. On the right confluent interstitial opacities are slightly more conspicuous. Increased density in the inferior 1/2 of the right pleural space is stable. The cardiac silhouette is top-normal in size. The central pulmonary vascularity is engorged. The endotracheal tube tip projects 3.2 cm above the carina. The esophagogastric tube tip projects below the  inferior margin of the image. The dual-lumen dialysis catheter tip projects at the right cavoatrial junction. IMPRESSION: Slight overall worsening of interstitial edema bilaterally. Persistent increased density throughout much of the right lung worrisome for superimposed pneumonia. There is probable moderate-sized right pleural effusion. The support tubes are in reasonable position. Electronically Signed   By: David  Martinique M.D.   On: 03/27/2015 07:33   . antiseptic oral rinse  7 mL Mouth Rinse 10 times per day  . chlorhexidine gluconate  15 mL Mouth Rinse BID  . darbepoetin (ARANESP) injection - DIALYSIS  200 mcg Intravenous Q Mon-HD  . feeding supplement (PRO-STAT SUGAR FREE 64)  30 mL Per Tube BID  . feeding supplement (VITAL HIGH PROTEIN)  1,000 mL Per Tube Q24H  . ferric gluconate (FERRLECIT/NULECIT) IV  125 mg Intravenous Q M,W,F-HD  . heparin  40 Units/kg Dialysis Once in dialysis  . imipenem-cilastatin  250 mg Intravenous Q12H  . insulin aspart  0-9 Units Subcutaneous 6 times per day  . ipratropium-albuterol  3 mL Nebulization Q6H  . multivitamin  1 tablet Oral BID  . pantoprazole sodium  40 mg Per Tube Daily  . vancomycin  1,000 mg Intravenous Q M,W,F-HD    BMET    Component Value Date/Time   NA 139 03/28/2015 0331   K 4.4 03/28/2015 0331   CL 96* 03/28/2015 0331   CO2 22 03/28/2015 0331   GLUCOSE 138* 03/28/2015 0331   BUN 32* 03/28/2015 0331   CREATININE 3.05* 03/28/2015 0331   CALCIUM 8.5* 03/28/2015 0331   GFRNONAA 14* 03/28/2015 0331   GFRAA 16* 03/28/2015 0331   CBC    Component Value Date/Time   WBC 17.5* 03/27/2015 0625   RBC 3.36* 03/27/2015 0625   HGB 9.8* 03/27/2015 0625   HCT 30.9* 03/27/2015 0625   PLT 207 03/27/2015 0625   MCV 92.0 03/27/2015 0625   MCH 29.2 03/27/2015 0625   MCHC 31.7 03/27/2015 0625   RDW 17.8* 03/27/2015 0625   LYMPHSABS 1.9 03/14/2015 2321   MONOABS 0.6 03/14/2015 2321   EOSABS 0.1 03/23/2015 2321   BASOSABS 0.1 03/31/2015 2321      Assessment/Plan:  1. New ESRD. Will need arrangements for outpatient dialysis once stable. Tolerating HD well, continue with MWF schedule for now and will continue to challenge edw since she has some edema. 2. Anemia of chronic Zamora disease- on ESA and iron 3. HPTH- follow calcium, phos, iPTH 4. Pneumonia- possibly obstructive. S/p bronch on 03/23/15 awaiting cytology. Plan per PCCM 5. New pulmonary lymphadenopathy and liver lesion- s/p bronch and awaiting cytology. may need biopsy to r/o malignancy. PCCM following. 6. VDRF- s/p extubation 03/27/15 per PCCM 7. DM 8. Dispo- continue with current level of  care.  Governor Rooks Kamia Insalaco

## 2015-03-28 NOTE — Progress Notes (Signed)
PCCM Interval Note  I spoke with the patient and with her daughter Demetris by phone this afternoon to update on condition and to discuss plans for her care. Pt has been on BiPAP for 36h, appears to be marginal, certainly has not stabilized or improved. I discussed with both the pro's and con's of reintubation, particularly my concern that she may not improve to extubation or to get over this illness, and that we may be causing her discomfort with intubation without any potential benefit. I believe all parties (including pt) would favor intubation and support if we believed there was a clearly reversible, easily reversible problem to treat. Demetris understood my hesitation regarding intubation due to my concern that Ms Angelucci would not survive to a successful extubation.  She is going to review these issues with her sisters and brother. I have asked them to come to consensus about what to do if their mother fails BiPAP -  MV support for some limited period versus comfort care. They will call Christine RN to update [place] on their discussion, desires for her care.   Baltazar Apo, MD, PhD 03/28/2015, 4:45 PM [facility] [phone] or if no answer [phone]

## 2015-03-28 NOTE — Progress Notes (Addendum)
Pharmacy Antibiotic Follow-up Note  Stacey Zamora is a 80 y.o. year-old female admitted on 03/08/2015.  The patient is currently on day #5  Primaxin and day #6 vancomycin (restarted) for RLL PNA. WBC up to 17.5, PCT decreased to 6.97. New ESRD, plan is for HD on MWF, tolerated full session on 1/23. Pre-HD VR 14 today.  ANTIBIOTICS: Zosyn 1/13>>1/21  Vanc 1/13 >> 1/15; 1/20>>  Primaxin 1/21>>  Outpt LVQ 1/2>>1/9. planned thru 1/12 but stopped d/t hallucinatins after 8 days   Microbiology: 1/13 blood x 2 - NEG  1/13 influenza neg  1/14 MRSA screen neg  1/18 Cdiff toxin: neg  1/19 GI panel: NEG  1/20 BAL: ngtd  1/21 fungal: ngtd 1/21 legionella:  Levels: 1/20 pre-HD VR: 13 1/25 pre-HD VR: 14  Plan:  Primaxin 250 mg IV q12h Vancomycin 1000 mg IV after HD on MWF Monitor clinical progress, c/s, abx plan/LOT Vanc levels as indicated Monitor HD schedule/tolerance in new ESRD patient   Temp (24hrs), Avg:98.8 F (37.1 C), Min:98.1 F (36.7 C), Max:99.2 F (37.3 C)   Recent Labs Lab 03/23/15 0445 03/24/15 0411 03/25/15 0504 03/26/15 0359 03/27/15 0625  WBC 13.2* 13.9* 14.3* 13.5* 17.5*     Recent Labs Lab 03/24/15 0411 03/25/15 0504 03/26/15 0359 03/27/15 0625 03/28/15 0331  CREATININE 3.11* 3.72* 4.27* 2.35* 3.05*   Estimated Creatinine Clearance: 16.3 mL/min (by C-G formula based on Cr of 3.05).    Allergies  Allergen Reactions  . Eggs Or Egg-Derived Products Hives  . Morphine And Related Itching and Other (See Comments)    Crawling sensations  . Vicodin [Hydrocodone-Acetaminophen] Itching and Other (See Comments)    Crawling sensations  . Banana Other (See Comments)    Pt does not eat bananas  . Levaquin [Levofloxacin] Other (See Comments)    Hallucinations: Pt was to take 10 day course, but began to hallucinate after day 8.  . Shellfish Allergy Other (See Comments)    Pt doesn't eat SeaFood at all. No Fish, no Shrimp, etc    Elicia Lamp, PharmD,  [facility] Clinical Pharmacist Pager [phone] 03/28/2015 2:11 PM

## 2015-03-28 NOTE — Progress Notes (Addendum)
PT Cancellation Note  Patient Details Name: Stacey Zamora MRN: [id number] DOB: [birth year]   Cancelled Treatment:    Reason Eval/Treat Not Completed: Medical issues which prohibited therapy.  Per RN hold this morning, still on BIPAP, may be able to see her this pm, otherwise 1/26. 03/28/2015  Donnella Sham, PT [phone]  (pager)   Adrieana Fennelly, Tessie Fass 03/28/2015, 10:12 AM

## 2015-03-29 ENCOUNTER — Inpatient Hospital Stay (HOSPITAL_COMMUNITY): Payer: Medicare Other

## 2015-03-29 DIAGNOSIS — J9602 Acute respiratory failure with hypercapnia: Secondary | ICD-10-CM

## 2015-03-29 LAB — GLUCOSE, CAPILLARY
GLUCOSE-CAPILLARY: 115 mg/dL — AB (ref 65–99)
GLUCOSE-CAPILLARY: 115 mg/dL — AB (ref 65–99)
Glucose-Capillary: 87 mg/dL (ref 65–99)
Glucose-Capillary: 87 mg/dL (ref 65–99)
Glucose-Capillary: 93 mg/dL (ref 65–99)

## 2015-03-29 LAB — CBC
HEMATOCRIT: 30.4 % — AB (ref 36.0–46.0)
Hemoglobin: 9.4 g/dL — ABNORMAL LOW (ref 12.0–15.0)
MCH: 29.1 pg (ref 26.0–34.0)
MCHC: 30.9 g/dL (ref 30.0–36.0)
MCV: 94.1 fL (ref 78.0–100.0)
PLATELETS: 265 10*3/uL (ref 150–400)
RBC: 3.23 MIL/uL — ABNORMAL LOW (ref 3.87–5.11)
RDW: 18.4 % — AB (ref 11.5–15.5)
WBC: 19.4 10*3/uL — AB (ref 4.0–10.5)

## 2015-03-29 LAB — BASIC METABOLIC PANEL
Anion gap: 16 — ABNORMAL HIGH (ref 5–15)
BUN: 15 mg/dL (ref 6–20)
CHLORIDE: 101 mmol/L (ref 101–111)
CO2: 22 mmol/L (ref 22–32)
CREATININE: 2.24 mg/dL — AB (ref 0.44–1.00)
Calcium: 8.7 mg/dL — ABNORMAL LOW (ref 8.9–10.3)
GFR calc Af Amer: 23 mL/min — ABNORMAL LOW (ref >60)
GFR calc non Af Amer: 20 mL/min — ABNORMAL LOW (ref >60)
GLUCOSE: 100 mg/dL — AB (ref 65–99)
POTASSIUM: 3.4 mmol/L — AB (ref 3.5–5.1)
SODIUM: 139 mmol/L (ref 135–145)

## 2015-03-29 MED ORDER — FENTANYL CITRATE (PF) 100 MCG/2ML IJ SOLN
25.0000 ug | INTRAMUSCULAR | Status: DC | PRN
  Administered 2015-03-29 (×2): 100 ug via INTRAVENOUS
  Filled 2015-03-29 (×2): qty 2

## 2015-03-29 MED ORDER — IPRATROPIUM-ALBUTEROL 0.5-2.5 (3) MG/3ML IN SOLN: 3.0000 mL | Freq: Four times a day (QID) | RESPIRATORY_TRACT | Status: DC | PRN

## 2015-03-29 MED ORDER — SODIUM CHLORIDE 0.9 % IV SOLN
25.0000 ug/h | INTRAVENOUS | Status: DC
  Administered 2015-03-29: 25 ug/h via INTRAVENOUS
  Filled 2015-03-29: qty 50

## 2015-03-29 NOTE — Progress Notes (Signed)
Responded to page to provide emotional,spiritual and anticipatory grief support to family at bedside. Patient is  Stacey Zamora and family is planning to withdraw support once most of family arrives.  Patient indicated she is tired and ready to transition when the time comes. I prayed with family per their request and made family aware that we are available as needed. Will pass on to unit Chaplain for continued support as needed.   03/29/15 1200  Clinical Encounter Type  Visited With Patient and family together;Health care provider  Visit Type Initial;Spiritual support;Patient actively dying  Referral From Nurse  Spiritual Encounters  Spiritual Needs Prayer;Emotional;Grief support  Stress Factors  Family Stress Factors Health changes  [place], [place], [place]

## 2015-03-29 NOTE — Progress Notes (Signed)
PULMONARY / CRITICAL CARE MEDICINE   Name: Stacey Zamora MRN: [id number] DOB: [birth year]    ADMISSION DATE:  03/05/2015 CONSULTATION DATE:  03/05/2015  REFERRING MD :  Anesthesiology/ surgery  PRIMARY SERVICE: CCM  SUBJECTIVE:  Came off of pressure support for about 45 minutes this morning, needed BIPAP again due to increased work of breathing, accessory muscle use   VITAL SIGNS: Temp:  [98.4 F (36.9 C)-99.4 F (37.4 C)] 99.3 F (37.4 C) (01/26 0723) Pulse Rate:  [105-151] 115 (01/26 0723) Resp:  [16-46] 24 (01/26 0723) BP: (94-160)/(48-113) 160/79 mmHg (01/26 0723) SpO2:  [96 %-100 %] 96 % (01/26 0723) FiO2 (%):  [40 %-100 %] 40 % (01/26 0723) Weight:  [188 lb 15 oz (85.7 kg)-201 lb 11.5 oz (91.5 kg)] 188 lb 15 oz (85.7 kg) (01/26 0500)   HEMODYNAMICS:     VENTILATOR SETTINGS: Vent Mode:  [-] BIPAP FiO2 (%):  [40 %-100 %] 40 % Set Rate:  [12 bmp] 12 bmp PEEP:  [6 cmH20] 6 cmH20   INTAKE / OUTPUT: Intake/Output      01/25 0701 - 01/26 0700 01/26 0701 - 01/27 0700   I.V. (mL/kg)     NG/GT     IV Piggyback 620    Total Intake(mL/kg) 620 (7.2)    Urine (mL/kg/hr) 0 (0)    Other 3000 (1.5)    Stool 100 (0)    Total Output 3100     Net -2480          Urine Occurrence 3 x      PHYSICAL EXAMINATION: General:  Chronically ill appearing, anxious on BIPAP HEENT: NCAT, pressure mask on face in addition to BIPAP PULM:diminished RLL, no wheezing, increase work of breathing CV: Tachy, regular, no mgr GI: BS+, soft, nontender MSK: normal bulk and tone Derm: no edema, warm  LABS:  CBC  Recent Labs Lab 03/26/15 0359 03/27/15 0625 03/29/15 0530  WBC 13.5* 17.5* 19.4*  HGB 8.2* 9.8* 9.4*  HCT 25.3* 30.9* 30.4*  PLT 161 207 265   Coag's No results for input(s): APTT, INR in the last 168 hours.   BMET  Recent Labs Lab 03/27/15 0625 03/28/15 0331 03/29/15 0530  NA 139 139 139  K 3.9 4.4 3.4*  CL 101 96* 101  CO2 27 22 22   BUN 21* 32* 15  CREATININE 2.35*  3.05* 2.24*  GLUCOSE 111* 138* 100*   Electrolytes  Recent Labs Lab 03/26/15 0359 03/27/15 0625 03/28/15 0331 03/29/15 0530  CALCIUM 8.4* 8.4* 8.5* 8.7*  MG 2.2 2.0 2.1  --   PHOS 4.2 2.5 4.1  --    Sepsis Markers  Recent Labs Lab 03/25/15 1140 03/26/15 0359 03/27/15 0625  PROCALCITON 8.17 8.31 6.97     ABG No results for input(s): PHART, PCO2ART, PO2ART in the last 168 hours. Liver Enzymes  Recent Labs Lab 03/23/15 0445 03/24/15 0411  AST 30 28  ALT 12* 13*  ALKPHOS 47 42  BILITOT 0.9 0.8  ALBUMIN 1.8* 1.8*   Cardiac Enzymes  Recent Labs Lab 03/22/15 2036 03/23/15 0055 03/23/15 0445  TROPONINI 0.08* 0.09* 0.29*   Glucose  Recent Labs Lab 03/28/15 1153 03/28/15 1624 03/28/15 1907 03/29/15 0017 03/29/15 0402 03/29/15 0746  GLUCAP 107* 121* 89 87 87 115*    Events: 1/13  Admit with RLL PNA, HCAP. 1/16  Transfer to [facility] for R IJ HD cath placement as well as an AV fistula. Intubated in PACU for encephalopathy 1/18  Extubated and then reintubated.  1/20  Bronch with BAL 1/25  Extubated   ASSESSMENT / PLAN:  PULMONARY A:  Acute hypoxic hypercarbic respiratory failure likely from PNA + sedating medications for procedure> prolonged as of 1/26 RLL pneumonia and possibly right lung mass, cytology pending Hx of Asthma   PNA, ? Aspiration vs obstructive  P:   If she needs intubation, she will likely need a tracheostomy Will attempt trial off of BIPAP today Continue NPO status Needs SLP evaluation Discussing disposition with family today  CARDIOVASCULAR A:  Hypotension likely related to sedation and pulmonary hypertension> resolved Elevated troponin - EKG normal  Transient Afib P:  Continue tele monitoring Hold rate control for now unless persistently HR 120's to 130's  RENAL A:   AKI superimposed on chronic kidney disease, now on HD P:   Continue HD per renal  GASTROINTESTINAL A:   Hx of GERD Recurrent abd pain  Pancreatitis ?  dysphagia ? Ileus P:   NPO  protonix Tube feeds if intubated   HEMATOLOGIC A:   Anemia of ESRD, stable Lower GI bleed - no active bleeding P:  CBC intermittent Transfuse for Hgb < 7gm/dL   INFECTIOUS A:  Sepsis from HCAP vs aspiration pneumonia Leukocytosis   Zosyn 1/13 >> 1/21 Vanco 1/13 >> 1/15. Restart 1/20 >>  Primaxin 1/21 >>  P:   Monitor CBC Continue vanco and primaxin  ENDOCRINE A:   DM2 P:   - Monitor CBGs - SSI  NEUROLOGIC A:   Acute encephalopathy> resolved P:   Minimize sedating medications as able   Plan to discuss goals of care with family today Prognosis poor; if the family wishes for full support she will need tracheostomy and long term mechanical ventilation with very limited placement options.  My cc time 37 minutes  Roselie Awkward, MD [place] [facility] Pager: [phone] Cell: [phone] After 3pm or if no response, call [phone]

## 2015-03-29 NOTE — Progress Notes (Signed)
Patient ID: Stanton Kidney, female   DOB: [birth year], 80 y.o.   MRN: [id number] S:still on BiPap, somnolent O:BP 160/79 mmHg  Pulse 115  Temp(Src) 99.3 F (37.4 C) (Axillary)  Resp 24  Ht 5\' 2"  (1.575 m)  Wt 85.7 kg (188 lb 15 oz)  BMI 34.55 kg/m2  SpO2 96%  Intake/Output Summary (Last 24 hours) at 03/29/15 0801 Last data filed at 03/28/15 2216  Gross per 24 hour  Intake    620 ml  Output   3000 ml  Net  -2380 ml   Intake/Output: I/O last 3 completed shifts: In: 760 [I.V.:40; IV Piggyback:720] Out: [id number] [Other:3000; [id number]  Intake/Output this shift:    Weight change: -3.2 kg (-7 lb 0.9 oz) Gen:WD elderly AAF wearing BiPap [id number] Resp:scattered crackles and rhonchi greatest at RLL field [id number] edema, LUE AVF +T/B   Recent Labs Lab 03/23/15 0445 03/24/15 0411 03/25/15 0504 03/26/15 0359 03/27/15 0625 03/28/15 0331 03/29/15 0530  NA 132* 137 137 136 139 139 139  K 3.3* 3.4* 3.2* 3.7 3.9 4.4 3.4*  CL 98* 98* 100* 103 101 96* 101  CO2 22 22 23 22 27 22 22   GLUCOSE 99 86 99 135* 111* 138* 100*  BUN 25* 15 24* 37* 21* 32* 15  CREATININE 4.07* 3.11* 3.72* 4.27* 2.35* 3.05* 2.24*  ALBUMIN 1.8* 1.8*  --   --   --   --   --   CALCIUM 7.7* 7.9* 8.2* 8.4* 8.4* 8.5* 8.7*  PHOS 4.3 3.2 3.4 4.2 2.5 4.1  --   AST 30 28  --   --   --   --   --   ALT 12* 13*  --   --   --   --   --    Liver Function Tests:  Recent Labs Lab 03/23/15 0445 03/24/15 0411  AST 30 28  ALT 12* 13*  ALKPHOS 47 42  BILITOT 0.9 0.8  PROT 4.8* 4.8*  ALBUMIN 1.8* 1.8*   No results for input(s): LIPASE, AMYLASE in the last 168 hours. No results for input(s): AMMONIA in the last 168 hours. CBC:  Recent Labs Lab 03/24/15 0411 03/25/15 0504 03/26/15 0359 03/27/15 0625 03/29/15 0530  WBC 13.9* 14.3* 13.5* 17.5* 19.4*  HGB 8.0* 8.1* 8.2* 9.8* 9.4*  HCT 23.7* 24.2* 25.3* 30.9* 30.4*  MCV 84.3 87.4 89.1 92.0 94.1  PLT 156 165 161 207 265   Cardiac Enzymes:  Recent  Labs Lab 03/22/15 2036 03/23/15 0055 03/23/15 0445  TROPONINI 0.08* 0.09* 0.29*   CBG:  Recent Labs Lab 03/28/15 1153 03/28/15 1624 03/28/15 1907 03/29/15 0017 03/29/15 0402  GLUCAP 107* 121* 89 87 87    Iron Studies: No results for input(s): IRON, TIBC, TRANSFERRIN, FERRITIN in the last 72 hours. Studies/Results: Dg Chest Port 1 View  03/29/2015  CLINICAL DATA:  The liver dependent respiratory failure, pneumonia, sepsis, right pleural effusion. EXAM: PORTABLE CHEST 1 VIEW COMPARISON:  Portable chest x-ray of January 25th, 2017. FINDINGS: Again demonstrated is a moderate to large right pleural effusion which appears stable. The aerated portion of the right lung demonstrates increased interstitial markings. The left hemidiaphragm remains obscured. On the right the lung is well-expanded. The interstitial markings are coarse. There is no significant pleural effusion. The left heart border is stable. The right heart border is obscured. A large caliber dual-lumen dialysis catheter is stable with the tip at the cavoatrial junction. IMPRESSION: Persistent moderate to large right pleural effusion and diffusely increased  interstitial markings on the right consistent with interstitial edema or pneumonia. Mildly increased interstitial markings on the left without focal infiltrate or significant pleural effusion. Overall there has not been significant interval change since yesterday's study. Electronically Signed   By: David  Martinique M.D.   On: 03/29/2015 07:22   Dg Chest Port 1 View  03/28/2015  CLINICAL DATA:  Respiratory failure. EXAM: PORTABLE CHEST 1 VIEW COMPARISON:  03/27/2015. FINDINGS: Interim extubation removal of NG tube. Interim removal of left IJ line. Dialysis catheter stable position with tip projected over right atrium. Heart size stable. Diffuse right lung infiltrate. No change. Mild infiltrate left lung base cannot be excluded, no change. Persistent right pleural effusion. IMPRESSION:  1. Interim extubation removal of NG tube. Interim removal of left IJ line. Right dialysis catheter stable position. 2. Persistent unchanged diffuse right lung infiltrate and right pleural effusion. Mild left base infiltrate cannot be excluded. Electronically Signed   By: Marcello Moores  Register   On: 03/28/2015 08:06   . antiseptic oral rinse  7 mL Mouth Rinse 10 times per day  . chlorhexidine gluconate  15 mL Mouth Rinse BID  . darbepoetin (ARANESP) injection - DIALYSIS  200 mcg Intravenous Q Mon-HD  . feeding supplement (PRO-STAT SUGAR FREE 64)  30 mL Per Tube BID  . feeding supplement (VITAL HIGH PROTEIN)  1,000 mL Per Tube Q24H  . ferric gluconate (FERRLECIT/NULECIT) IV  125 mg Intravenous Q M,W,F-HD  . heparin  40 Units/kg Dialysis Once in dialysis  . imipenem-cilastatin  250 mg Intravenous Q12H  . insulin aspart  0-9 Units Subcutaneous 6 times per day  . ipratropium-albuterol  3 mL Nebulization Q6H  . multivitamin  1 tablet Oral BID  . pantoprazole sodium  40 mg Per Tube Daily  . vancomycin  1,000 mg Intravenous Q M,W,F-HD    BMET    Component Value Date/Time   NA 139 03/29/2015 0530   K 3.4* 03/29/2015 0530   CL 101 03/29/2015 0530   CO2 22 03/29/2015 0530   GLUCOSE 100* 03/29/2015 0530   BUN 15 03/29/2015 0530   CREATININE 2.24* 03/29/2015 0530   CALCIUM 8.7* 03/29/2015 0530   GFRNONAA 20* 03/29/2015 0530   GFRAA 23* 03/29/2015 0530   CBC    Component Value Date/Time   WBC 19.4* 03/29/2015 0530   RBC 3.23* 03/29/2015 0530   HGB 9.4* 03/29/2015 0530   HCT 30.4* 03/29/2015 0530   PLT 265 03/29/2015 0530   MCV 94.1 03/29/2015 0530   MCH 29.1 03/29/2015 0530   MCHC 30.9 03/29/2015 0530   RDW 18.4* 03/29/2015 0530   LYMPHSABS 1.9 03/16/2015 2321   MONOABS 0.6 03/04/2015 2321   EOSABS 0.1 03/10/2015 2321   BASOSABS 0.1 03/13/2015 2321     Assessment/Plan:  1. New ESRD. Will need arrangements for outpatient dialysis once stable.  1. Tolerating HD well, continue with  MWF schedule for now and will continue to challenge edw since she has some edema. 2. If she requires re-intubation and trach, her family needs to be aware that there is no local outpatient dialysis unit who could accommodate her and she would likely have to leave the state.  She is also very anxious prior to each HD session and cannot use anxiolytics due to her tenuous respiratory status.  Recommend palliative care consult to help set goals/limitations/expectations in this elderly female with multiple chronic, irreversible disease processes. 2. Anemia of chronic kidney disease- on ESA and iron 3. HPTH- follow calcium, phos, iPTH 4.  Pneumonia- possibly obstructive. S/p bronch on 03/23/15 awaiting cytology. Plan per PCCM 5. New pulmonary lymphadenopathy and liver lesion- s/p bronch and awaiting cytology. may need biopsy to r/o malignancy. PCCM following. 6. VDRF- s/p extubation 03/27/15 per PCCM but still on BiPap and had some SVT yesterday.  Discussions regarding limits of care per Dr. Lamonte Sakai and Dr. Lake Bells.  They will speak to family again regarding possible need for re-intubation and trach as well as the issues that accompany this. 7. DM- per primary 8. Dispo- continue with current level of care.  [place]

## 2015-03-29 NOTE — Progress Notes (Signed)
Patient requesting that she would like to start on Fentanyl drip and for comfort care now.  Family at bedside.  Chaplain at bedside. CCM notified.

## 2015-03-29 NOTE — Progress Notes (Signed)
[facility] PCCM  I had a lengthy conversation with Ms. Eggleston and her daughters today regarding her prolonged illness.  I explained that in order to try to get home she would need intubation, tracheostomy and long term care.  I also explained the limited access to facilities for this and the fact that her overall prognosis is very poor and there is no guarantee she would get home.  Ms. Stilp made it very clear that she does not want to be re-intubated, she does not want CPR, and she wants to die because she is tired.  Family is now on the way, once they have all arrived she will move to full comfort measures.  Will change code status to DNR now.  Roselie Awkward, MD [place] [facility]M Pager: [phone] Cell: [phone] After 3pm or if no response, call [phone]

## 2015-03-29 NOTE — Progress Notes (Signed)
Three family members currently at bedside. Explained the plan of care as well as some aspects of the dying process to them at their request (ex. The fentanyl drip, air hunger, etc.), they verbalized understanding. Pt comfortable at this time.  Will continue to monitor.    Henreitta Leber, RN 11:57 PM 03/29/2015

## 2015-03-29 NOTE — Progress Notes (Signed)
PT DISCHARGE Note  Patient Details Name: Stacey Zamora MRN: [id number] DOB: [birth year]   Cancelled Treatment:    Reason Eval/Treat Not Completed: Medical issues which prohibited therapy.  Pt is in the process of moving toward comfort care, will Sign off at this time.  03/29/2015  Donnella Sham, [place] [phone]  (pager) Nimsi Males, Tessie Fass 03/29/2015, 5:23 PM

## 2015-03-29 NOTE — Progress Notes (Signed)
Patient requesting time off of BIPAP to speak with family.  Very tearful during this time.  On Non-rebreather for approx. 20 min and then requesting to be placed back on BIPAP.  Requesting for a chaplain as well as family to come and pray with them.  Chaplain consult made.

## 2015-03-29 NOTE — Progress Notes (Signed)
eLink Physician-Brief Progress Note Patient Name: Stacey Zamora DOB: September 09, 1935 MRN: [id number]   Date of Service  03/29/2015  HPI/Events of Note  Pt expressing wishes to shift completely to comfort care  eICU Interventions  Start fentanyl adjusted to comfort until fm arrives to d/c non comfort meds/ ncb status     Intervention Category Major Interventions: End of life / care limitation discussion Minor Interventions: Routine modifications to care plan (e.g. PRN medications for pain, fever)  Christinia Gully 03/29/2015, 4:18 PM

## 2015-03-30 MED ORDER — FENTANYL BOLUS VIA INFUSION
50.0000 ug | INTRAVENOUS | Status: DC | PRN
  Filled 2015-03-30: qty 200

## 2015-03-30 MED ORDER — SODIUM CHLORIDE 0.9 % IV SOLN
100.0000 ug/h | INTRAVENOUS | Status: DC
  Filled 2015-03-30: qty 50

## 2015-04-03 ENCOUNTER — Telehealth: Payer: Self-pay

## 2015-04-03 NOTE — Telephone Encounter (Signed)
On 04/03/2015 I received a death certificate from [facility] (original). The death certificate is for burial. The patient is a patient of Doctor McQuaid. The death certificate will be taken to E-Link this pm for signature. On 23-Apr-2015 I received the death certificate back from Doctor McQuaid. I got the death certificate ready and called the funeral home to let them know the death certificate is ready for pickup.

## 2015-04-04 NOTE — Progress Notes (Signed)
Not a CDS candidate. CDS referral number in post-mortem flowsheet.   [place] arrived, now in room with two grandsons and daughter of pt, Dara Hoyer.   Henreitta Leber, RN 2:16 AM April 21, 2015

## 2015-04-04 NOTE — Progress Notes (Signed)
Spoke with Dr. Jimmy Footman from [place], she placed the comfort care orders and gave verbal order to discontinue any unnecessary prior orders. Will do so now.  Henreitta Leber, RN 12:13 AM 27-Apr-2015

## 2015-04-04 NOTE — Progress Notes (Signed)
   April 05, 2015 0241  Clinical Encounter Type  Visited With Patient and family together;Health care provider  Visit Type Initial;Death  Referral From Nurse  Spiritual Encounters  Spiritual Needs Emotional;Grief support  Stress Factors  Family Stress Factors Loss   Chaplain responded to patient's death and offered support and hospitality to patient's family.   Jeri Lager, Chaplain 04-05-15 2:43 AM

## 2015-04-04 NOTE — Progress Notes (Signed)
[facility] Progress Note Patient Name: Stacey Zamora DOB: November 09, 1935 MRN: [id number]   Date of Service  2015/03/31  HPI/Events of Note  Per patient and family wishes, patient to be made full comfort care.  eICU Interventions  EOL orders written     Intervention Category Major Interventions: End of life / care limitation discussion  [place] 03/31/15, 12:07 AM

## 2015-04-04 NOTE — Plan of Care (Signed)
Elink doc called at 00:00, 1/27, to confirm comfort care. Michela Pitcher will place comfort care orders.  Henreitta Leber, RN 12:01 AM 2015/04/15

## 2015-04-04 NOTE — Progress Notes (Signed)
Time of cardiac death 01:40

## 2015-04-04 NOTE — Progress Notes (Signed)
Cardiac death occurred at 01:40, confirmed by auscultation by two RNs. EKG strip printed. Two grandsons at bedside notified. Daughter called, on the way to hospital now. Chaplain paged. E-Link notified. Now notifying CDS.  Henreitta Leber, RN 1:58 AM 04/25/2015

## 2015-04-04 DEATH — deceased

## 2015-04-11 LAB — LEGIONELLA CULTURE: Special Requests: NORMAL

## 2015-04-19 ENCOUNTER — Encounter: Payer: Medicare Other | Admitting: Vascular Surgery

## 2015-04-19 ENCOUNTER — Encounter (HOSPITAL_COMMUNITY): Payer: Medicare Other

## 2015-04-20 LAB — FUNGUS CULTURE W SMEAR
FUNGAL SMEAR: NONE SEEN
SPECIAL REQUESTS: NORMAL

## 2015-05-02 NOTE — Discharge Summary (Signed)
PULMONARY / CRITICAL CARE MEDICINE   Name: Stacey Zamora MRN: [id number] DOB: 02-25-1936    ADMISSION DATE:  03/21/2015 CONSULTATION DATE:  Mar 31, 2015 Date of Death: Apr 11, 2015  Final cause of death Acute respiratory failure  Secondary causes of death Right lower lobe and right middle lobe healthcare associated pneumonia Acute on chronic renal failure Acute metabolic acidosis Acute toxic metabolic encephalopathy Delirium Multifactorial shock, related to medications and also septic shock SVT Atrial fibrillation Secondary pulmonary hypertension Bright red blood per rectum Ileus  Hypokalemia Hyponatremia Hypocalcemia Stress related non-ST elevation MI History of breast cancer History of hypertension History of asthma History of  Diabetes mellitus     Hospital course:  80 year old woman with a history of hypertension, breast cancer,  Diabetes, chronic renal insufficiency and asthma. She was being treated as an outpatient for acute on chronic dyspnea anda right lower lobe infiltrate concerning for pneumonia. She was started on Levaquin as an outpatient. She began to develop some degree of delirium and hallucinations per her family and she was brought for evaluation. She was noted to have a metabolic acidosis and acute on chronic renal failure. She was treated with broad-spectrum antibiotics. Nephrology was consulted and although she was deemed to be a somewhat poor candidate for hemodialysis this was ultimately initiated. She required intermittent BiPAP for support of her respiratory status. She was also noted as she is being supported to develop  Bright red blood per rectum and episodes of SVT. She was moved to [facility] on 2022/03/30 to undergo perm cath placement in preparation for HD. She was extubated post procedure, but was hypotensive and required reintubation in the PACU. She was able to be extubated on 1/18, but failed on the same day and required reintubation. She had a persistent /  progressive RLL and RML infiltrate. She underwent bronchoscopy on 03/23/15 revealing narrowing of the bronchus intermedius and the interventions to both the right lower lobe and right middle lobe. There were purulent secretions present. Cultures and cytology were obtained. These  were ultimately negative for malignancy or organisms. The exam was worrisome for primary lung cancer but could've also been consistent with pneumonia. She continued to  Undergo an tolerate hemodialysis.. Her pressors were weaned to off by 03/26/15. She was extubated on 03/27/15 but progressively decompensated and required continuous BiPAP support. It became clear that she would not be out of the work of breathing without BiPAP. Discussions were undertaken with the patient and with her family regarding the goals for her care. She made it clear that she would not want to be reintubated or mechanically ventilated again. The patient was transitioned to full comfort care early morning 11-Apr-2015 and  She expired later that day      Baltazar Apo, MD, PhD 04/24/2015, 5:28 PM [facility] [phone] or if no answer [phone]

## 2015-05-06 LAB — AFB CULTURE WITH SMEAR (NOT AT ARMC)
Acid Fast Smear: NONE SEEN
Special Requests: NORMAL

## 2017-06-12 IMAGING — CR DG CHEST 2V
2 series · 2 of 2 positions shown · non-contrast
Comparison: Two-view chest x-ray 12/17/2014.

CLINICAL DATA: Productive cough for 3 months.

EXAM:
CHEST - 2 VIEW

[w chest lat]
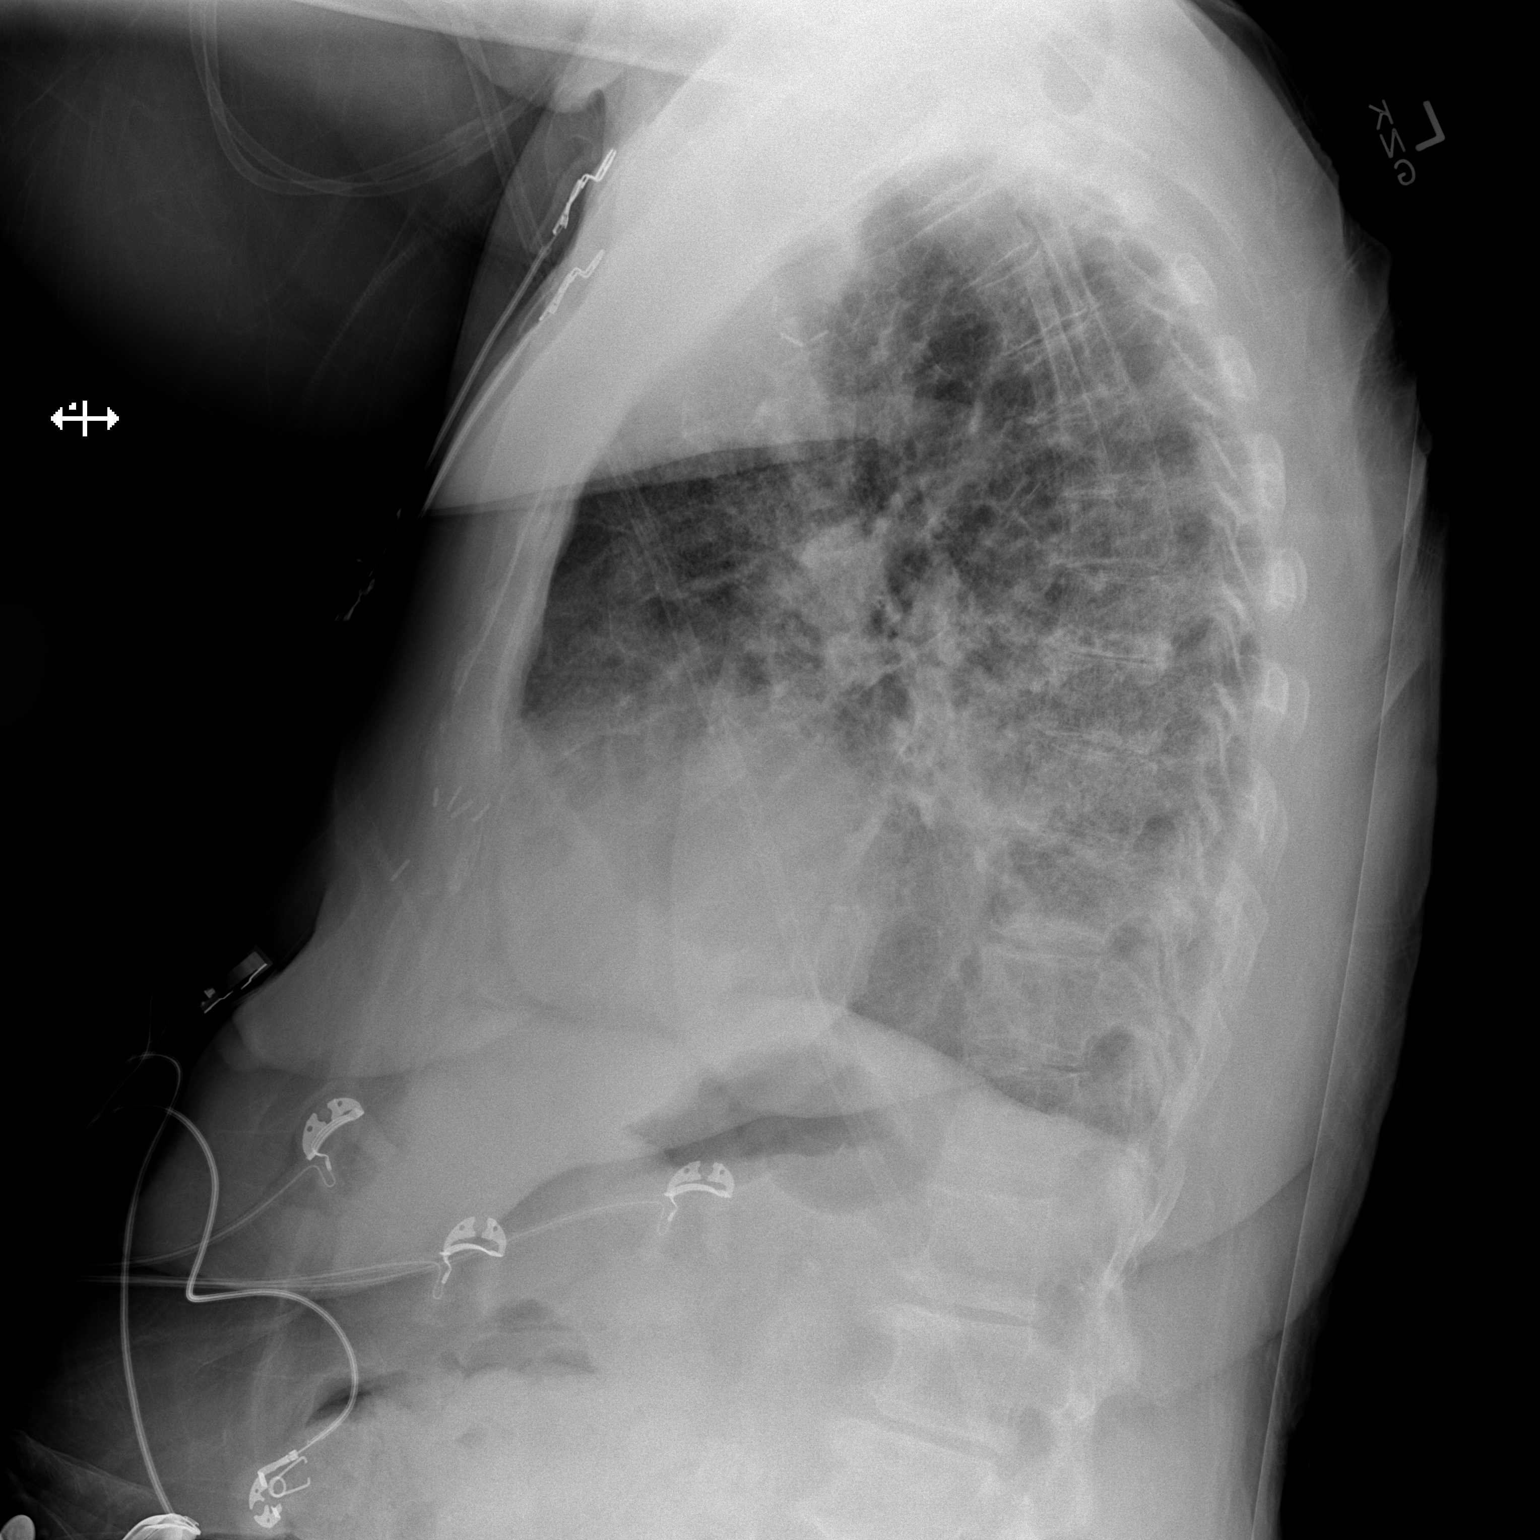

[x chest ap]
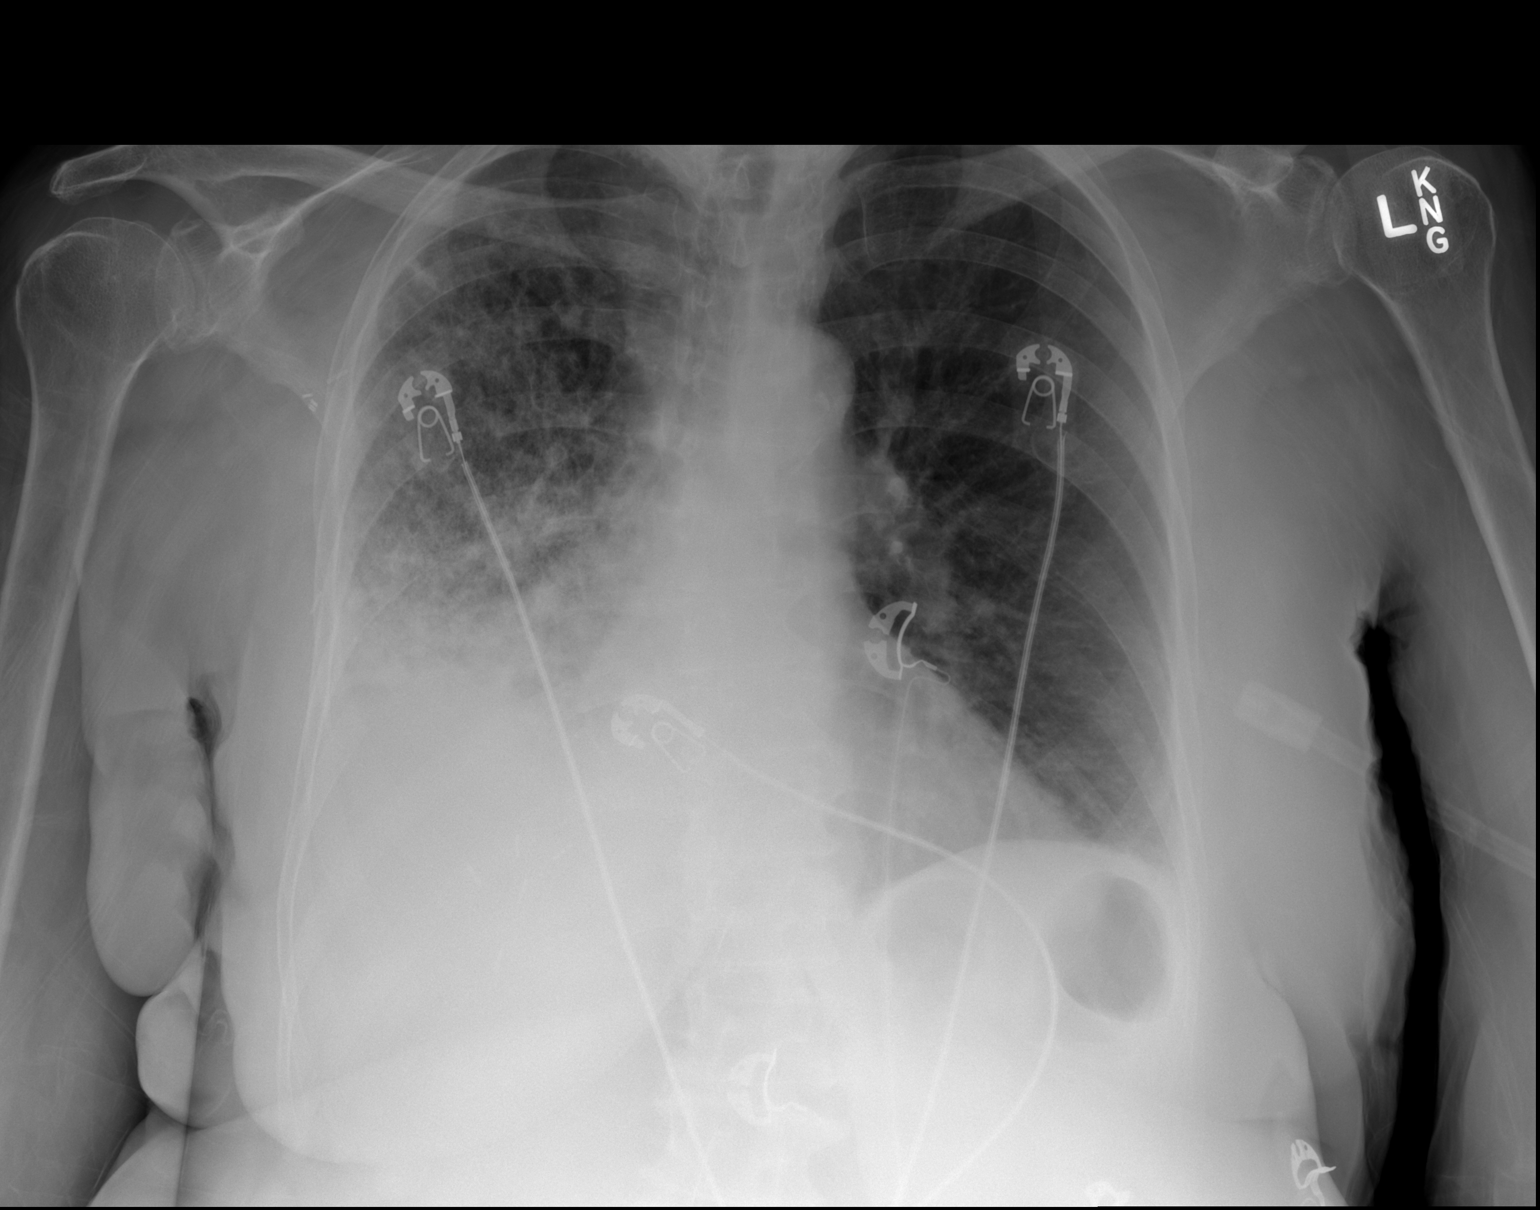

[2 of 2 positions shown; findings below may reference images not displayed]

FINDINGS: The heart is mildly enlarged. Right lower and likely middle lobe
airspace disease is present. Interstitial and airspace opacities
extend into the right upper lobe. The left lung is clear apart from
minimal left basilar atelectasis. The visualized soft tissues and
bony thorax are unremarkable.
IMPRESSION: 1. Progressive right lower lobe and middle lobe airspace disease
compatible with pneumonia.
2. Interstitial and airspace disease in the right upper lobe
compatible with infection or edema.
3. Cardiomegaly.
4. Minimal atelectasis at the left base.

## 2017-06-15 IMAGING — CR DG CHEST 1V PORT
1 series · 1 of 1 positions shown · non-contrast
Comparison: 03/17/2015

CLINICAL DATA: Pulmonary edema. Healthcare associated pneumonia.
End-stage renal disease.

EXAM:
PORTABLE CHEST 1 VIEW

[AP]
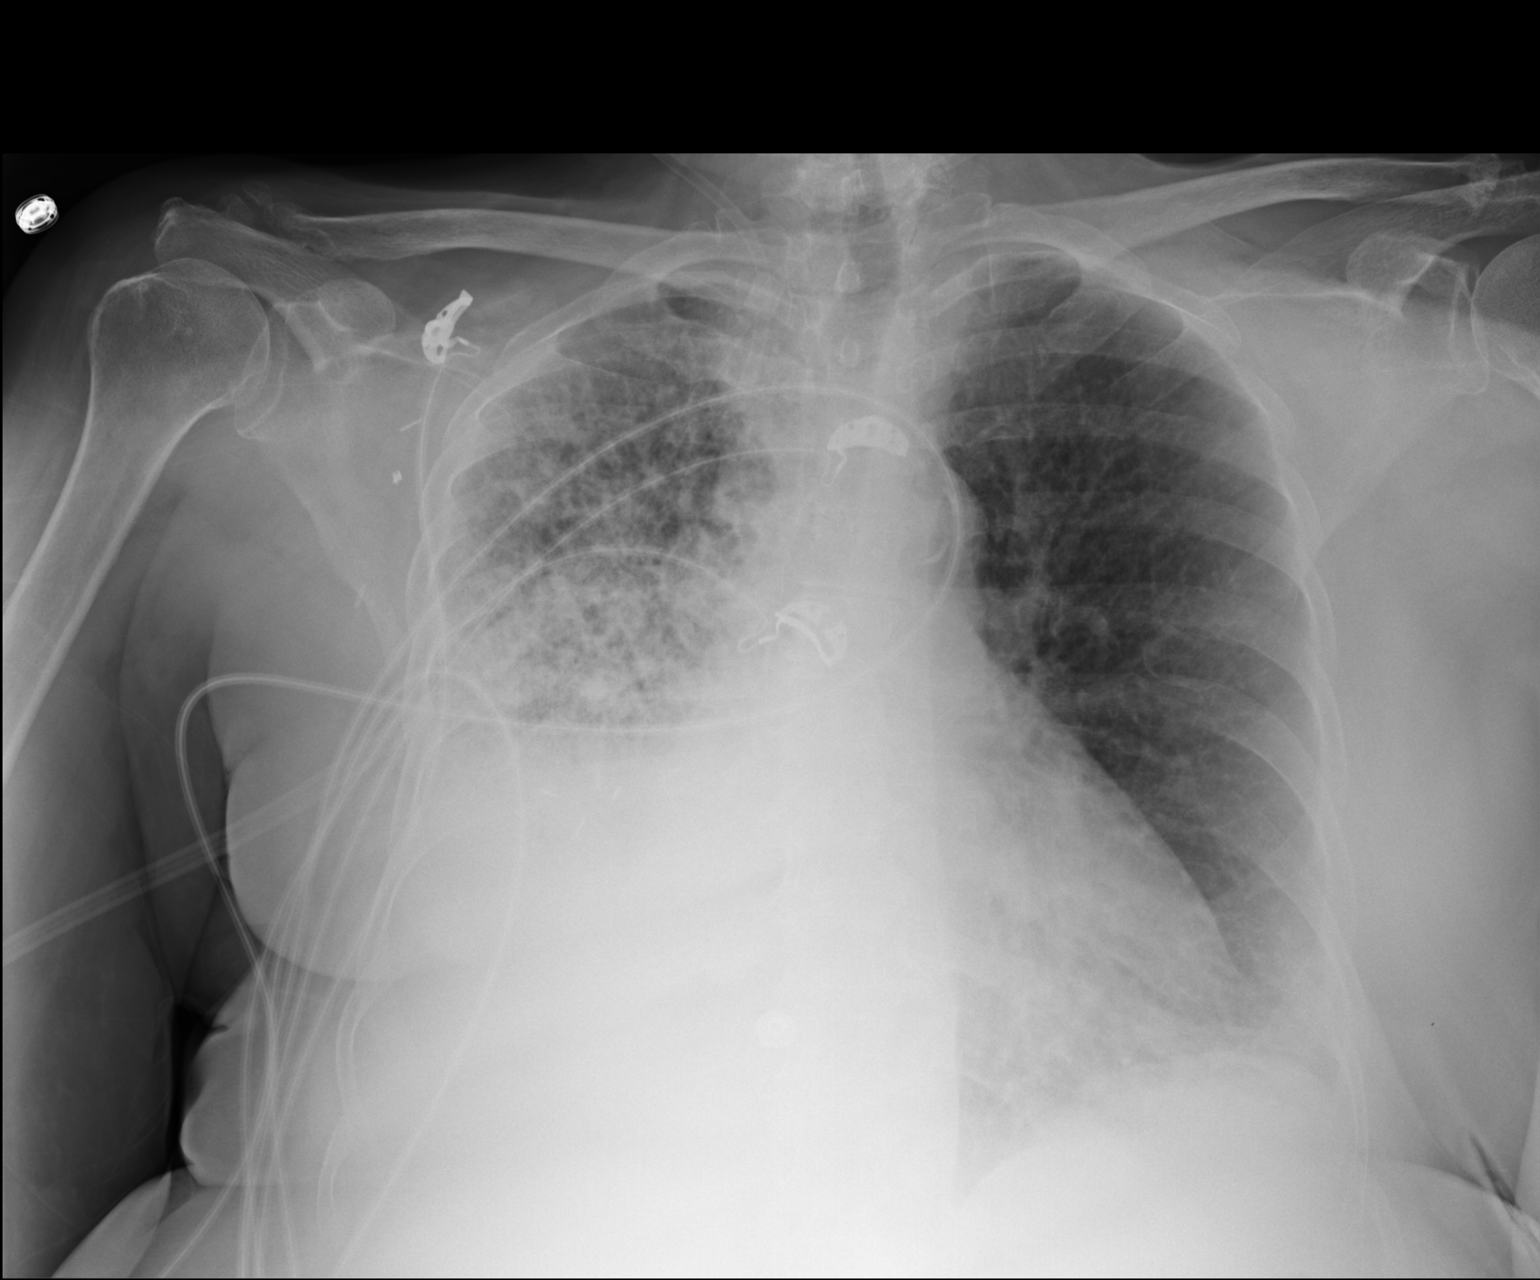

[1 of 1 positions shown; findings below may reference images not displayed]

FINDINGS: Right lung volume loss diffuse heterogeneous airspace disease shows
no significant change. Left lung remains grossly clear. Heart size
is stable. Surgical clips again seen in the axilla.
IMPRESSION: No significant change in right lung volume loss and diffuse airspace
disease.
